# Patient Record
Sex: Female | Born: 1945 | Race: White | Hispanic: No | Marital: Married | Smoking: Former smoker
Health system: Southern US, Community
[De-identification: ages and names within clinical notes are randomized; demographics above are authoritative.]

## PROBLEM LIST (undated history)

## (undated) DIAGNOSIS — E785 Hyperlipidemia, unspecified: Secondary | ICD-10-CM

## (undated) DIAGNOSIS — E559 Vitamin D deficiency, unspecified: Secondary | ICD-10-CM

## (undated) DIAGNOSIS — K5790 Diverticulosis of intestine, part unspecified, without perforation or abscess without bleeding: Secondary | ICD-10-CM

## (undated) DIAGNOSIS — E039 Hypothyroidism, unspecified: Secondary | ICD-10-CM

## (undated) DIAGNOSIS — E119 Type 2 diabetes mellitus without complications: Secondary | ICD-10-CM

## (undated) DIAGNOSIS — IMO0002 Reserved for concepts with insufficient information to code with codable children: Secondary | ICD-10-CM

## (undated) DIAGNOSIS — N6002 Solitary cyst of left breast: Secondary | ICD-10-CM

## (undated) DIAGNOSIS — F419 Anxiety disorder, unspecified: Secondary | ICD-10-CM

## (undated) DIAGNOSIS — K449 Diaphragmatic hernia without obstruction or gangrene: Secondary | ICD-10-CM

## (undated) DIAGNOSIS — E1142 Type 2 diabetes mellitus with diabetic polyneuropathy: Secondary | ICD-10-CM

## (undated) DIAGNOSIS — S8002XA Contusion of left knee, initial encounter: Secondary | ICD-10-CM

## (undated) DIAGNOSIS — R55 Syncope and collapse: Secondary | ICD-10-CM

## (undated) DIAGNOSIS — I723 Aneurysm of iliac artery: Secondary | ICD-10-CM

## (undated) DIAGNOSIS — F32A Depression, unspecified: Secondary | ICD-10-CM

## (undated) DIAGNOSIS — J45909 Unspecified asthma, uncomplicated: Secondary | ICD-10-CM

## (undated) DIAGNOSIS — J449 Chronic obstructive pulmonary disease, unspecified: Secondary | ICD-10-CM

## (undated) DIAGNOSIS — I517 Cardiomegaly: Secondary | ICD-10-CM

## (undated) DIAGNOSIS — J309 Allergic rhinitis, unspecified: Secondary | ICD-10-CM

## (undated) DIAGNOSIS — F329 Major depressive disorder, single episode, unspecified: Secondary | ICD-10-CM

## (undated) DIAGNOSIS — I10 Essential (primary) hypertension: Secondary | ICD-10-CM

## (undated) DIAGNOSIS — I739 Peripheral vascular disease, unspecified: Secondary | ICD-10-CM

## (undated) DIAGNOSIS — N6001 Solitary cyst of right breast: Secondary | ICD-10-CM

## (undated) DIAGNOSIS — K219 Gastro-esophageal reflux disease without esophagitis: Secondary | ICD-10-CM

## (undated) HISTORY — PX: HERNIA REPAIR: SHX51

## (undated) HISTORY — DX: Anxiety disorder, unspecified: F41.9

## (undated) HISTORY — DX: Syncope and collapse: R55

## (undated) HISTORY — DX: Type 2 diabetes mellitus with diabetic polyneuropathy: E11.42

## (undated) HISTORY — DX: Unspecified asthma, uncomplicated: J45.909

## (undated) HISTORY — DX: Reserved for concepts with insufficient information to code with codable children: IMO0002

## (undated) HISTORY — PX: TOTAL ABDOMINAL HYSTERECTOMY: SHX209

## (undated) HISTORY — PX: BREAST BIOPSY: SHX20

## (undated) HISTORY — DX: Hypothyroidism, unspecified: E03.9

## (undated) HISTORY — DX: Diaphragmatic hernia without obstruction or gangrene: K44.9

## (undated) HISTORY — DX: Allergic rhinitis, unspecified: J30.9

## (undated) HISTORY — PX: REPLACEMENT TOTAL KNEE: SUR1224

## (undated) HISTORY — PX: CARPAL TUNNEL RELEASE: SHX101

## (undated) HISTORY — DX: Aneurysm of iliac artery: I72.3

## (undated) HISTORY — DX: Solitary cyst of right breast: N60.02

## (undated) HISTORY — PX: LUMBAR FUSION: SHX111

## (undated) HISTORY — DX: Chronic obstructive pulmonary disease, unspecified: J44.9

## (undated) HISTORY — DX: Contusion of left knee, initial encounter: S80.02XA

## (undated) HISTORY — PX: CHOLECYSTECTOMY: SHX55

## (undated) HISTORY — DX: Solitary cyst of right breast: N60.01

## (undated) HISTORY — DX: Peripheral vascular disease, unspecified: I73.9

## (undated) HISTORY — DX: Cardiomegaly: I51.7

## (undated) HISTORY — DX: Vitamin D deficiency, unspecified: E55.9

## (undated) HISTORY — DX: Essential (primary) hypertension: I10

## (undated) HISTORY — DX: Type 2 diabetes mellitus without complications: E11.9

## (undated) HISTORY — DX: Depression, unspecified: F32.A

## (undated) HISTORY — DX: Diverticulosis of intestine, part unspecified, without perforation or abscess without bleeding: K57.90

## (undated) HISTORY — DX: Gastro-esophageal reflux disease without esophagitis: K21.9

## (undated) HISTORY — DX: Hyperlipidemia, unspecified: E78.5

## (undated) HISTORY — DX: Major depressive disorder, single episode, unspecified: F32.9

---

## 2000-04-26 HISTORY — PX: CERVICAL FUSION: SHX112

## 2001-11-27 ENCOUNTER — Encounter: Payer: Self-pay | Admitting: Neurosurgery

## 2001-11-28 ENCOUNTER — Inpatient Hospital Stay (HOSPITAL_COMMUNITY): Admission: RE | Admit: 2001-11-28 | Discharge: 2001-12-02 | Payer: Self-pay | Admitting: Neurosurgery

## 2001-11-28 ENCOUNTER — Encounter: Payer: Self-pay | Admitting: Neurosurgery

## 2002-04-04 ENCOUNTER — Encounter: Payer: Self-pay | Admitting: Neurosurgery

## 2002-04-04 ENCOUNTER — Ambulatory Visit (HOSPITAL_COMMUNITY): Admission: RE | Admit: 2002-04-04 | Discharge: 2002-04-05 | Payer: Self-pay | Admitting: Neurosurgery

## 2002-05-08 ENCOUNTER — Encounter: Payer: Self-pay | Admitting: Neurosurgery

## 2002-05-08 ENCOUNTER — Inpatient Hospital Stay (HOSPITAL_COMMUNITY): Admission: RE | Admit: 2002-05-08 | Discharge: 2002-05-11 | Payer: Self-pay | Admitting: Neurosurgery

## 2002-11-23 ENCOUNTER — Ambulatory Visit (HOSPITAL_COMMUNITY): Admission: RE | Admit: 2002-11-23 | Discharge: 2002-11-23 | Payer: Self-pay | Admitting: Neurosurgery

## 2002-11-23 ENCOUNTER — Encounter: Payer: Self-pay | Admitting: Neurosurgery

## 2003-08-22 ENCOUNTER — Encounter: Admission: RE | Admit: 2003-08-22 | Discharge: 2003-08-22 | Payer: Self-pay | Admitting: Neurosurgery

## 2004-06-15 ENCOUNTER — Ambulatory Visit (HOSPITAL_COMMUNITY): Admission: RE | Admit: 2004-06-15 | Discharge: 2004-06-15 | Payer: Self-pay | Admitting: Neurosurgery

## 2006-04-05 ENCOUNTER — Other Ambulatory Visit: Payer: Self-pay

## 2006-04-05 ENCOUNTER — Inpatient Hospital Stay: Payer: Self-pay | Admitting: Endocrinology

## 2006-04-06 ENCOUNTER — Other Ambulatory Visit: Payer: Self-pay

## 2006-05-11 ENCOUNTER — Inpatient Hospital Stay: Payer: Self-pay | Admitting: Endocrinology

## 2007-01-24 IMAGING — CR DG CHEST 2V
1 series · 2 of 2 positions shown · non-contrast
Comparison: none

REASON FOR EXAM: Pneumonia
COMMENTS:

[Series 1: view not recorded · 0.17mm/px · 2 of 2 slices shown]
[im 1/2]
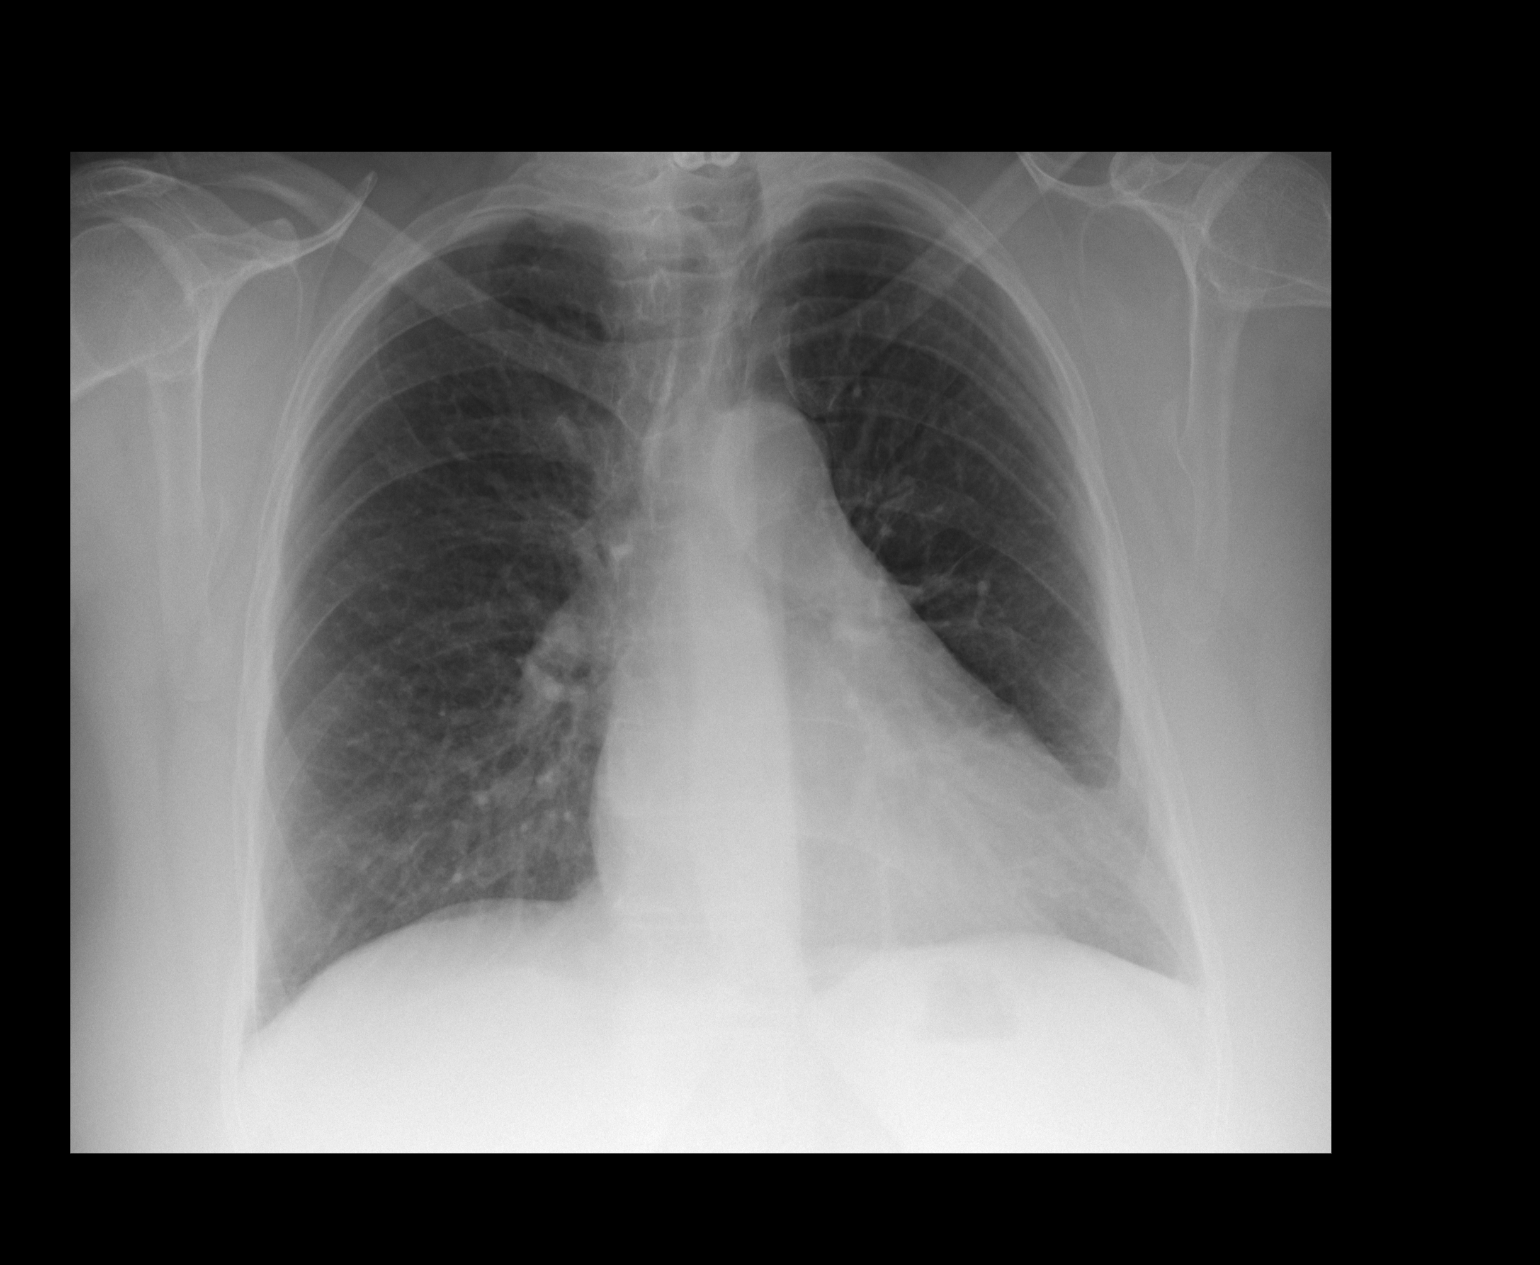
[im 2/2]
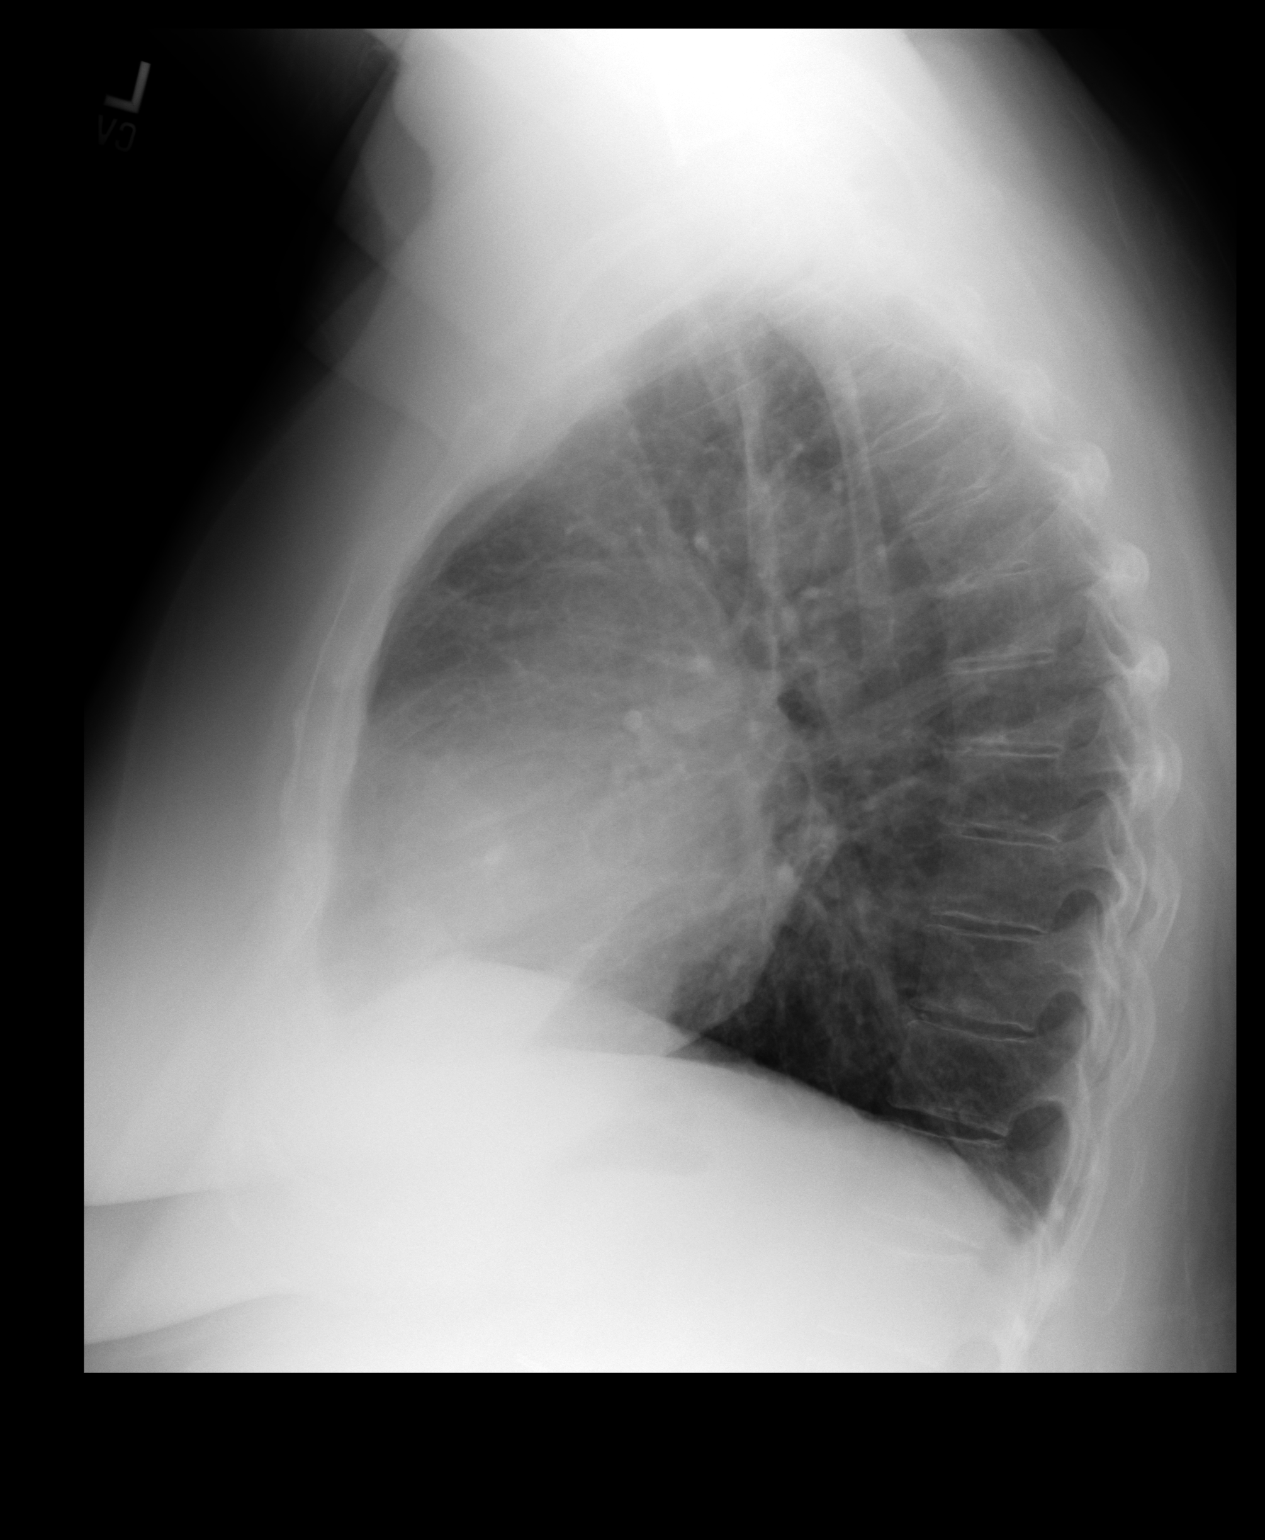

[2 of 2 positions shown; findings below may reference images not displayed]

PROCEDURE:     DXR - DXR CHEST PA (OR AP) AND LATERAL  - May 16, 2006 [DATE]

RESULT:     Comparison is made to the study of 05/12/06.

The lungs are mildly hyperinflated with hemidiaphragm flattening.  The
patchy density present on the LEFT in the region of the lingula has largely
cleared.  Only a small amount of density is seen along the LEFT heart border
now.  The cardiac silhouette is top normal in size.  The pulmonary
vascularity is not engorged and I see no pleural effusion.
IMPRESSION: There has been interval near total clearing of the infiltrate or atelectasis
in the lingula on the LEFT.

Mild hyperinflation persists consistent with an element of reactive airway
disease.

## 2008-11-19 ENCOUNTER — Encounter: Admission: RE | Admit: 2008-11-19 | Discharge: 2008-11-19 | Payer: Self-pay | Admitting: Neurosurgery

## 2009-08-06 ENCOUNTER — Ambulatory Visit: Payer: Self-pay | Admitting: Specialist

## 2010-09-11 NOTE — Op Note (Signed)
Molly Hoover, Molly Hoover                         ACCOUNT NO.:  [id number]   MEDICAL RECORD NO.:  [id number]                   PATIENT TYPE:  INP   LOCATION:  3029                                 FACILITY:  [facility]   PHYSICIAN:  Clydene Fake, M.D.               DATE OF BIRTH:  03/30/1946   DATE OF PROCEDURE:  05/08/2002  DATE OF DISCHARGE:                                 OPERATIVE REPORT   PREOPERATIVE DIAGNOSES:  1. 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  2. Spondylosis.  3. Lateral recessed stenosis.  4. L4-5 herniated nucleus pulposus.   POSTOPERATIVE DIAGNOSES:  1. 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  2. Spondylosis.  3. Lateral recessed stenosis.  4. L4-5 herniated nucleus pulposus.   OPERATION PERFORMED:  1. Failed decompressive laminectomies at L4-5 and L5-S1.  2. Posterior lumbar interbody infusion, L4-5 and L5-S1.  3. Interbody Brantigan cages at L4-5 and L5-S1.  4. Monarch segmented pedicle screw fixation, L4-5 and S1.  5. Posterolateral fusion, two levels from L4-S1,  6. Autografts, same incision, allograft, bone marrow aspirate.   SURGEON:  Clydene Fake, M.D.   ASSISTANT:  Stefani Dama, M.D.   ANESTHESIA:  General endotracheal tube anesthesia.   ESTIMATED BLOOD LOSS:  Fourteen-hundred cubic centimeters.   BLOOD REPLACED:  Eight-hundred cubic centimeters of cell saver blood given  back.   COMPLICATIONS:  No complications.   DRAINS:  No drains.   DISPOSITION:  The patient was transferred to the recovery room at the end of  the procedure in stable condition.   REASON FOR SURGERY:  The patient is a 65 year old woman who has been having  back and leg pain.  She also had some neck problems.  She underwent a CF  first; that was five months ago and she states she has done well there.  She  continues with back and leg problems.  Workup included an MRI and then  myelogram that showed unstable spondylolisthesis at L4-5 and L5-S1 with a  disk herniation at L4-5 and lateral recessed stenosis.  The patient was  brought in for decompression and fusion.   OPERATION IN DETAIL:  The patient was brought into the operating room where  general anesthesia was induced.  The patient was placed in the prone  position on the Wilson frame with all pressure points padded.  The patient  was then prepped and draped in a usual sterile fashion.  The site of  incision was injected with 10 cubic centimeters of 1% lidocaine with  epinephrine.   An incision was then made in the midline of the lower lumbar spine from L3  through S1.  This incision was taken down to the fascia and hemostasis was  obtained with Bovie cauterization.  The fascia was incised with the Bovie  and subperiosteal dissection was  done over the spinous process of the lamina  at the facets and transverse processes of L4-5 and the lateral sacrum  bilaterally. Fluoroscopic imaging was used to confirm positioning and then a  Gill decompressive laminectomy where we removed the pars and bilateral  facetectomies at both L4-5 and L5-S1 was then done to decompress the central  canal, and the nerve roots, both upper and lower roots, due to the  compression from the severe spondylosis from the spondylolisthesis.  We  ____________ decompression of the L4, L5 and S1 roots, and explored the disk  spaces with a guide and the decompression went out lateral to the start of  the facets on each side.   A large disk herniation was seen centrally and a little more to the left at  L4-5.  The disk spaces were incised and diskectomy was performed with  pituitary rongeurs and curets.  All the disk spaces were then incised both  at L4-5 and L5-S1 bilaterally; diskectomies performed.  We then distracted  the interspaces on one side up to 11 mm as we continued to do a diskectomy  on the opposite side preparing the interspaces  for interbody fusion.  We did  this at both levels. We removed the distraction on the opposite side and  then cleaned out the opposite side with pituitary rongeurs and curets.  We  then placed neural retractors around the upper root and lower roots, and  used a final broach, and tapped that into place removing the cartilaginous  endplate bilaterally at L4-5 and L5-S1 preparing the interspaces for our  cage placement.   All the bone that was removed during the laminectomy was cleaned and chopped  up into small pieces, and to this DBX putty was added, and this mixture was  packed into Brantigan cages; 11 high by 9 wide cages were used.  With  protection of the nerve roots the cages were tapped into place at L4-5;  first on one side and then the opposite side.  Before cages were placed,  this same bone mixture was placed into the interspace for interbody fusion  outside the cages.  Once the cages were in place we explored the other nerve  roots and they were also decompressed, and we placed Gelfoam and Thrombin at  the interspace and it went out into the L5-S1 level.  Note, we held  distraction on the opposite side when we were placing the first cage.  We  repeated this process at L5-S1, again, using an 11 mm high by 9 mm Brantigan  cages.  Both the cages entered the interspace after placing bone into the  interspace.  All the nerve roots were decompressed, ___________ Gelfoam and  Thrombin were placed in the epidural space.   Attention was then taken to the pedicles for a pedicle entry point using  fluoroscopic imaging as a guide.  We also used that during placement of the  cages, and it did show good position of the cages at all levels.  We  decorticated the entry point, placed a pedicle probe down the pedicle.  Using fluoroscopic imaging as a guide, we used a ball probe to probe the hole ____________ bone was surrounding on the hole, aspirated bone marrow  aspirate from each pedicle and  added this to conduit like a bone substitute  to be used later in the case for posterolateral fusion.  We then tapped the  pedicle, again checked the pedicle with a probe to make sure  that we had a  good bony surface circumferentially and then placed 6.25 mm Monarch screws.  Forty-five millimeters screws were placed into L4, 40 mm screws placed in L5  and 35 mm screws placed in S1.  This was done first on the left side on then  on the right side.  Once all the screws were in we placed a rod into the  screw heads and then placed __________ bilaterally, and then placed the  locking nuts over those.  We tightened up ________nut at L4 with the final  tightener; and, then with some compression over the interspace tightened the  nut at L5 with some compression over L5-S1.  We tightened the nut at S1.  We  had repeated this process on the other side.   When our fusions were done AP and  lateral fluoroscopic imagings were  obtained showing good position and good alignment of the spine, with good  position of the cages, and the pedicle screws and rods.   The wound was irrigated with antibiotic solution; and, when we decorticated  for placement of the pedicle screws we also decorticated the lateral facets  and transverse processes of L4-5, and the lateral sacrum.  At this point,  _____________ autograft bone, DBX putty and conduit associated with the bone  marrow aspirate were all mixed together, and this mixture was placed in the  posterolateral gutters for posterolateral fusion from L4 to S1 bilaterally.   We then explored the nerve roots, removing the Gelfoam.  The nerve roots  were all decompressed.  We placed some Gelfoam back over the nerve roots  __________ posterior bone fragments would bound and compress the roots.  We  removed the retractors and the paraspinous muscles and fascia were then  closed with 0-Vicryl interrupted suture.  The subcutaneous tissue was closed  0, 2-0 and 3-0  Vicryl interrupted sutures, and the skin closed the Benzoin  and Steri-strips.  Dressing was placed.   The patient was placed back in the supine position, awakened from anesthesia  and taken to the recovery room in stable condition.                                                Clydene Fake, M.D.    JRH/MEDQ  D:  05/08/2002  T:  05/08/2002  Job:  [id number]

## 2010-09-11 NOTE — Op Note (Signed)
NAMEGEETIKA, LABORDE               ACCOUNT NO.:  [id number]   MEDICAL RECORD NO.:  [id number]          PATIENT TYPE:  OIB   LOCATION:  2899                         FACILITY:  [facility]   PHYSICIAN:  Clydene Fake, M.D.  DATE OF BIRTH:  January 30, 1946   DATE OF PROCEDURE:  06/15/2004  DATE OF DISCHARGE:                                 OPERATIVE REPORT   PREOPERATIVE DIAGNOSIS:  Right carpal tunnel syndrome.   POSTOPERATIVE DIAGNOSIS:  Right carpal tunnel syndrome.   OPERATION PERFORMED:  Right carpal tunnel release.   SURGEON:  Clydene Fake, M.D.   INDICATIONS FOR PROCEDURE:  The patient is a 65 year old woman who has had  bilateral wrist and hand pain with numbness worse on the right with EMG and  nerve conduction velocity showing bilateral carpal tunnel syndrome of  moderate degree, worse on the right.  The patient was brought in for a right-  sided carpal tunnel release.   DESCRIPTION OF PROCEDURE:  The patient was brought to the operating room and  sedated by anesthesia.  The patient was prepped and draped in a sterile  fashion.  The site of incision across the wrist and the right arm was  injected with 10 mL of 1% lidocaine with epinephrine.  An S-shaped incision  was then made across the wrist to the distal palm.  The incision was taken  down to the flexor retinaculum.  The flexor retinaculum was incised with a  15 blade down in order to visualize the nerve.  A #4 Penfield was placed  under the ligament over the nerve and the ligament was opened with a 15  blade then along its course both proximally and distally.  Neurolysis of the  nerve was done, freeing up the nerve more, explored distally. As mentioned,  the  nerve was completely decompressed the course of the flexor retinaculum  and then using Metzenbaum scissors, we made sure it was decompressed more  proximally. When we were finished, we had good decompression of the nerve.  Hemostasis was obtained with bipolar cautery.   Wound was irrigated with  antibiotic solution and the incision closed with 4-0 nylon mattress and  simple interrupted sutures.  Dressing was placed, then wrapped with Kerlix,  then lightly with Ace wrap.  Then the patient was transferred to recovery  room in stable condition.      JRH/MEDQ  D:  06/15/2004  T:  06/15/2004  Job:  [id number]

## 2010-09-11 NOTE — Discharge Summary (Signed)
   [name], Molly Hoover                         ACCOUNT NO.:  [id number]   MEDICAL RECORD NO.:  [id number]                   PATIENT TYPE:  INP   LOCATION:  3012                                 FACILITY:  [facility]   PHYSICIAN:  Cristi Loron, M.D.            DATE OF BIRTH:  1945/12/23   DATE OF ADMISSION:  11/28/2001  DATE OF DISCHARGE:  12/02/2001                                 DISCHARGE SUMMARY   For full details of this admission, please refer to the typed history and  physical.   BRIEF HISTORY:  The patient is a 65 year old female who suffered from neck  and arm pain.  She failed medical management and was worked up and decided  to proceed with C4-5 and C5-6 anterior cervical diskectomies, fusion and  plating after herniated disk and spondylosis were diagnosed at these levels.   HOSPITAL COURSE:  Dr. Clydene Fake admitted the patient to [facility] on November 28, 2001.  On the day of admission, he performed C4-5 and  C5-6 anterior cervical diskectomies, interbody iliac crest allograft  arthrodesis with anterior cervical plating.  The surgery went well without  complication (for further details of this operation, please refer to typed  operative note).   Postoperative course:  The patient's postoperative course was remarkable as  follows:  She had persistent dysphagia which resolved after a couple of days  of steroids.  By December 02, 2001, the patient was afebrile, vital signs were  stable and she was eating well and ambulating well, wound was healing well  without signs of infection and she was felt to be stable for discharge home  and she requested discharge home.   DISCHARGE INSTRUCTIONS:  The patient was given written discharge  instructions and instructed to follow up with Dr. Phoebe Perch.   She was also instructed to resume her outpatient medical regimen.   DISCHARGE MEDICATIONS:  1. Vicodin ES, #51.  2. Flexeril 10 mg, #40.   PROCEDURES PERFORMED:  C4-5 and  C5-6 anterior cervical diskectomies,  interbody iliac crest allograft arthrodesis and anterior cervical plating.   FINAL DIAGNOSES:  1. C4-5 and C5-6 spondylosis and herniated nucleus pulposus and cervical     myelopathy.  2. Type 2 diabetes mellitus.  3. Hypertension.                                               Cristi Loron, M.D.    JDJ/MEDQ  D:  01/08/2002  T:  01/09/2002  Job:  [id number]

## 2010-09-11 NOTE — Discharge Summary (Signed)
Molly Hoover, Molly Hoover                         ACCOUNT NO.:  [id number]   MEDICAL RECORD NO.:  [id number]                   PATIENT TYPE:  INP   LOCATION:  3029                                 FACILITY:  [facility]   PHYSICIAN:  Clydene Fake, M.D.               DATE OF BIRTH:  Nov 12, 1945   DATE OF ADMISSION:  05/08/2002  DATE OF DISCHARGE:  05/11/2002                                 DISCHARGE SUMMARY   ADMISSION DIAGNOSES:  1. Unstable spondylolisthesis at L4-L5 and L5-S1 with degenerative disc     disease.  2. Spondylosis.  3. Recess stenosis.   DISCHARGE DIAGNOSES:  1. Unstable spondylolisthesis at L4-L5 and L5-S1 with degenerative disc     disease.  2. Spondylosis.  3. Recess stenosis.   PROCEDURES:  Gill's decompression at L4-L5 and L5-S1 with posterior laminar  interbody fusion at L4-L5 and L5-S1 with interbody Browning cages __________  screws and posterolateral fusion.   REASON FOR ADMISSION:  The patient is a 65 year old woman who has had back  and leg pain.  She was found to have some unstable spondylolisthesis at L4-  L5 and L5-S1 with herniated disc at L4-L5 and severe degenerative changes at  both levels.  The patient was brought in for decompression and fusion.   HOSPITAL COURSE:  The patient was admitted on the day of surgery and  underwent the procedure above without complications.  Postoperatively, the  patient was transferred to the floor and then to the recovery room and was  moving both legs well and having decreased leg pain, although she has been  getting some spasms.   She started getting up and moving around the first hospital day.  She did  have some pain and, by the end of the day, she was having a bit of pain and  had a bad night that next night, but on May 10, 2002, she started doing  much better, ambulating a lot better, doing well.  She had a bowel movement  and was eating better and overall feeling a lot better.  The incisions  stayed  clean, dry and intact.  The patient was discharged home on May 11, 2002, in stable condition.   DISCHARGE MEDICATIONS:  Same as prehospitalization plus:  1. Percocet 1-2 p.o. q.4-6 h. p.r.n.  2. Flexeril p.r.n. spasms.  3. Valium p.r.n. severe spasms or pain.   PLAN:  1. Diet as tolerated.  2. Keep incision dry.  3.     Up with brace.  4. No strenuous activity.  5. Follow up with me in three weeks.                                               Clydene Fake, M.D.    JRH/MEDQ  D:  05/11/2002  T:  05/11/2002  Job:  [id number]

## 2010-09-11 NOTE — H&P (Signed)
   Molly Hoover, Molly Hoover                         ACCOUNT NO.:  [id number]   MEDICAL RECORD NO.:  [id number]                   PATIENT TYPE:  INP   LOCATION:  3029                                 FACILITY:  [facility]   PHYSICIAN:  Clydene Fake, M.D.               DATE OF BIRTH:  1945-11-06   DATE OF ADMISSION:  05/08/2002  DATE OF DISCHARGE:                                HISTORY & PHYSICAL   CHIEF COMPLAINT:  Back and bilateral upper leg pain.   HISTORY:  This patient is a 65 year old woman who has had two level ACF done  five months ago and also had back and leg pain at that point in time.  Work  up with MRI back over the summer showed severe spondylosis of the lumbar  spine at multiple levels with some spondylolisthesis at L4-5 and 5-1, she  ended up also getting a myelogram of her spine with flexion and extension  showing a little bit of motion at 4-5 and 5-1, with some spondylolisthesis  at L4-5, lateral recess stenosis and large disk herniation at 4-5 central  and to the left.  Patient brought in for decompression and fusion.   PAST MEDICAL HISTORY:  Significant for diabetes and asthma.   PAST SURGICAL HISTORY:  Includes hysterectomy in 1970, gallbladder in 1997,  left breast surgery in 1992 and 1993 and the ACF in 2003.   MEDICATIONS:  Include gemfibrozil, Singulair, Premarin, Nexium, Glycet,  Advair, Allegra, Norvasc.   SOCIAL HISTORY:  She does not smoke, quit in 1990.  Drinks alcohol  occasional.  Is a housewife, married.   REVIEW OF SYSTEMS:  Otherwise negative.   FAMILY HISTORY:  Noncontributory.   PHYSICAL EXAMINATION:  HEENT:  Unremarkable.  NECK:  Supple.  LUNGS:  Clear.  HEART:  Regular rhythm.  ABDOMEN:  Soft and nontender.  EXTREMITIES:  Intact, no edema.  BACK:  Bilateral straight leg raise on the right causing upper leg pain, not  really clearly radicular pain.  Negative on the left.  Motor strength  intact.  Sensation slightly decreased to light touch  in the right L5  distribution.  Reflexes are diminished at the ankles bilaterally.   ASSESSMENT/PLAN:  Patient with sternal spondylolisthesis and stenosis of the  lumbar spine, admitted for decompression and fusion.                                                Clydene Fake, M.D.   JRH/MEDQ  D:  05/08/2002  T:  05/08/2002  Job:  [id number]

## 2010-09-11 NOTE — Op Note (Signed)
NAMENAVNEET, SCHMUCK                         ACCOUNT NO.:  [id number]   MEDICAL RECORD NO.:  [id number]                   PATIENT TYPE:  INP   LOCATION:  3172                                 FACILITY:  [facility]   PHYSICIAN:  Clydene Fake, M.D.               DATE OF BIRTH:  [birth year]   DATE OF PROCEDURE:  11/28/2001  DATE OF DISCHARGE:                                 OPERATIVE REPORT   PREOPERATIVE DIAGNOSES:  Herniated nucleus pulposus and spondylosis causing  internal stenosis and myelopathy at C4-5 and 5-6.   POSTOPERATIVE DIAGNOSES:  Herniated nucleus pulposus and spondylosis causing  internal stenosis and myelopathy at C4-5 and 5-6.   PROCEDURE:  Anterior cervical diskectomy and fusion C4-5 and 5-6 with  allograft and anterior cervical plate.   SURGEON:  Clydene Fake, M.D.   ASSISTANT:  Cristi Loron, M.D.   ANESTHESIA:  General endotracheal tube.   ESTIMATED BLOOD LOSS:  Minimal.   BLOOD REPLACED:  None.   DRAINS:  None.   COMPLICATIONS:  None.   REASON FOR PROCEDURE:  The patient is a 65 year old woman whose been having  neck and bilateral arm pain. This has been progressive over the last two  months. An MRI was done of the neck showing cord compression and spondylosis  at 4-5 and 5-6, worse at 4-5 requiring a two level anterior cervical  diskectomy. She is slightly myelopathic at the time of exam.   DESCRIPTION OF PROCEDURE:  The patient was brought to the operating room,  general anesthesia was induced. The patient was placed in Holter traction  with 10 pounds, prepped and draped in the usual sterile fashion. The  incision was injected with 10 cc of 1% lidocaine with epinephrine. An  incision was then made from the midline to the anterior border of the  sternocleidomastoid on the left side of the neck. The incision was made in  the midline of the platysma and hemostasis obtained with Bovie  cauterization. The platysma was incised with the Bovie and  blunt dissection  taken through the anterior cervical fascia to the anterior cervical spine. A  needle was placed in the disk space, x-ray obtained showing the C4-5  interspace. This disk space was incised with a 15 blade and partial  diskectomy performed, base of the needle was removed. The longus colli  muscle was then reflected laterally on each side using the Bovie when the  self retaining retractor was placed and extended over the 4-5 disk space.  Distraction pins were placed in the C4 and C5 and disk space distracted.   Diskectomy was then performed using curettes and pituitary rongeurs. The  posterior osteophytes, ligament and annulus were then removed with one 2 mm  Kerrison punches. We worked out laterally and bilateral foraminotomies were  performed and we did, after we were finished, decompress the spinal cord and  bilateral roots. The high speed  drill was used to remove the endplates at 4  and 5. The depth gauge was used to measure depth of vertebral body. A 6 mm  Tutogen allograft bone was cut a few millimeters shorter in depth and then  this was tapped into place. The distractor was removed and distraction pin  removed from C4 and placed into C6. The 5-6 interspace was incised with a 15  blade, partial diskectomy performed and 5-6 disk space was then distracted  with the distractor. Still using the microscope, diskectomy was then  performed with pituitary rongeurs and curettes and 1 and 2 mm Kerrison  punches were used to remove the posterior __________  ligaments and annulus.  To decompress the cord the bilateral foramen were opened. At the end,  __________ volume was measured and a 7 mm Tutogen allograft bone was then  cut to be a couple millimeters shorter in depth and tapped into placed.  Behind both bone plugs, we could put a nerve hook making sure there was room  between the dura and back down through the bone.   The wound was irrigated with antibiotic solution. The  distraction pins were  all removed. There was good hemostasis with Gelfoam and prothrombin and the  rest of the Gelfoam was irrigated out. The microscope was removed from the  field at this point and the traction weight was removed. A tether __________  plate was then closed C4 to C6 with two screws placed in the C4, two screws  placed in the C6, and one screw near C5. This was all C4, C6 and C5. The  screws were tightened down, x-ray was obtained showing good position of  plate and screws and interbody grafts and good alignment of the cervical  spine. The wound was irrigated with thrombin solution, hemostasis obtained  with Gelfoam and bipolar cauterization. All the Gelfoam was irrigated out  and when we had absolute hemostasis, the platysma was closed with 3-0 Vicryl  interrupted suture. The subcutaneous tissue was closed with 3-0 Vicryl  interrupted suture and the skin was closed with Benzoin and Steri-Strips. A  dressing was placed. The patient was placed into a hard cervical collar,  awakened from anesthesia and transferred to the recovery room in stable  condition.                                               Clydene Fake, M.D.    JRH/MEDQ  D:  11/28/2001  T:  12/02/2001  Job:  [id number]

## 2011-04-28 LAB — HM COLONOSCOPY: HM COLON: NORMAL

## 2011-07-02 ENCOUNTER — Ambulatory Visit: Payer: Self-pay | Admitting: Endocrinology

## 2011-08-23 ENCOUNTER — Ambulatory Visit: Payer: Self-pay | Admitting: Gastroenterology

## 2013-08-24 LAB — HM MAMMOGRAPHY: HM MAMMO: NORMAL

## 2013-10-09 LAB — HM DIABETES EYE EXAM

## 2013-10-10 ENCOUNTER — Ambulatory Visit: Payer: Self-pay | Admitting: Internal Medicine

## 2013-10-17 ENCOUNTER — Encounter: Payer: Self-pay | Admitting: Cardiovascular Disease

## 2013-10-17 ENCOUNTER — Ambulatory Visit (INDEPENDENT_AMBULATORY_CARE_PROVIDER_SITE_OTHER): Payer: Medicare Other | Admitting: Cardiovascular Disease

## 2013-10-17 ENCOUNTER — Other Ambulatory Visit (HOSPITAL_COMMUNITY): Payer: Self-pay | Admitting: *Deleted

## 2013-10-17 ENCOUNTER — Encounter (INDEPENDENT_AMBULATORY_CARE_PROVIDER_SITE_OTHER): Payer: Self-pay

## 2013-10-17 VITALS — BP 170/80 | HR 78 | Ht 62.0 in | Wt 269.0 lb

## 2013-10-17 DIAGNOSIS — R059 Cough, unspecified: Secondary | ICD-10-CM

## 2013-10-17 DIAGNOSIS — I10 Essential (primary) hypertension: Secondary | ICD-10-CM | POA: Insufficient documentation

## 2013-10-17 DIAGNOSIS — R0602 Shortness of breath: Secondary | ICD-10-CM | POA: Insufficient documentation

## 2013-10-17 DIAGNOSIS — E118 Type 2 diabetes mellitus with unspecified complications: Secondary | ICD-10-CM | POA: Insufficient documentation

## 2013-10-17 DIAGNOSIS — R05 Cough: Secondary | ICD-10-CM | POA: Insufficient documentation

## 2013-10-17 DIAGNOSIS — R053 Chronic cough: Secondary | ICD-10-CM

## 2013-10-17 DIAGNOSIS — Z789 Other specified health status: Secondary | ICD-10-CM

## 2013-10-17 DIAGNOSIS — R262 Difficulty in walking, not elsewhere classified: Secondary | ICD-10-CM | POA: Insufficient documentation

## 2013-10-17 DIAGNOSIS — R079 Chest pain, unspecified: Secondary | ICD-10-CM

## 2013-10-17 DIAGNOSIS — R0600 Dyspnea, unspecified: Secondary | ICD-10-CM

## 2013-10-17 DIAGNOSIS — E119 Type 2 diabetes mellitus without complications: Secondary | ICD-10-CM

## 2013-10-17 NOTE — Assessment & Plan Note (Signed)
She reports having a cough prior to the start of her ACE inhibitor. We did suggest she try to hold her ACE inhibitor for one to 2 weeks to see if her cough does improve to any degree

## 2013-10-17 NOTE — Assessment & Plan Note (Signed)
As she is unable to ambulate, she would not be able to do a routine treadmill study. Myoview has been ordered

## 2013-10-17 NOTE — Progress Notes (Signed)
Patient ID: Molly Hoover, female    DOB: 14-Sep-1945, 68 y.o.   MRN: [id number]  HPI Comments: Molly Hoover is a pleasant 68 year old woman with history of asthmatic bronchitis, type 2 diabetes, previous smoking history for at least 15 years who stopped in 1989, morbid obesity who presents for evaluation of shortness of breath and chest pain. She was told by her prior primary care physician that she had an enlarged heart on chest x-ray. She has DJD and chronic back pain, hyperlipidemia  Reports that in May 2013, she was treated for asthmatic bronchitis. Required antibiotics and prednisone. In the workup, she had a chest x-ray and was told by primary care that she had an enlarged heart.   She has chest pain over the past year. Sometimes with rest, sometimes with exertion. Sometimes pain is very intense and goes through to her back. Other times described as a stinging. She has chronic shortness of breath with exertion. This has been getting worse. Prior stress tests in 2007 was suboptimal as she did not achieve a target heart rate She denies any orthopnea or PND. She does have lower extremity swelling which has been a chronic issue, worse in the late afternoon  Blood pressure elevated today. She reports blood pressure at other doctor visits has been relatively well-controlled. Blood pressure may 22nd 2015 was 128/68 Problem work creatinine 0.75, normal LFTs, CRP 7.3, no recent cholesterol available, glucose 111  Prior echocardiogram March 2013 was essentially normal. Normal ejection fraction with normal LV size Admission to the hospital December 2007 for atypical chest pain.  EKG shows normal sinus rhythm with rate 73 beats per minute, low voltage, no significant ST or T wave changes  Outpatient Encounter Prescriptions as of 10/17/2013  Medication Sig  . alendronate (FOSAMAX) 70 MG tablet Take 70 mg by mouth once a week. Take with a full glass of water on an empty stomach.  . Cranberry 500 MG  CAPS Take by mouth daily.  . furosemide (LASIX) 40 MG tablet Take 40 mg by mouth 2 (two) times daily.  Marland Kitchen gemfibrozil (LOPID) 600 MG tablet Take 600 mg by mouth 2 (two) times daily before a meal.  . glipiZIDE-metformin (METAGLIP) 5-500 MG per tablet Take 1 tablet by mouth 2 (two) times daily before a meal.  . lisinopril (PRINIVIL,ZESTRIL) 5 MG tablet Take 5 mg by mouth daily.  . mometasone-formoterol (DULERA) 100-5 MCG/ACT AERO Inhale 2 puffs into the lungs 2 (two) times daily.  Marland Kitchen omega-3 acid ethyl esters (LOVAZA) 1 G capsule Take by mouth 2 (two) times daily.  . pioglitazone (ACTOS) 45 MG tablet Take 45 mg by mouth daily.  Marland Kitchen tiotropium (SPIRIVA) 18 MCG inhalation capsule Place 18 mcg into inhaler and inhale daily.  . Vitamin D, Ergocalciferol, (DRISDOL) 50000 UNITS CAPS capsule Take 50,000 Units by mouth every 7 (seven) days.    Review of Systems  Constitutional: Negative.   HENT: Negative.   Eyes: Negative.   Respiratory: Positive for chest tightness and shortness of breath.   Cardiovascular: Positive for chest pain.  Gastrointestinal: Negative.   Endocrine: Negative.   Musculoskeletal: Positive for arthralgias, back pain and gait problem.  Skin: Negative.   Allergic/Immunologic: Negative.   Neurological: Negative.   Hematological: Negative.   Psychiatric/Behavioral: Negative.   All other systems reviewed and are negative.   BP 170/80  Pulse 78  Ht 5\' 2"  (1.575 m)  Wt 269 lb (122.018 kg)  BMI 49.19 kg/m2 Repeat blood pressure 160/80  Physical Exam  Nursing note and vitals reviewed. Constitutional: She is oriented to person, place, and time. She appears well-developed and well-nourished.  HENT:  Head: Normocephalic.  Nose: Nose normal.  Mouth/Throat: Oropharynx is clear and moist.  Eyes: Conjunctivae are normal. Pupils are equal, round, and reactive to light.  Neck: Normal range of motion. Neck supple. No JVD present.  Cardiovascular: Normal rate, regular rhythm, S1  normal, S2 normal, normal heart sounds and intact distal pulses.  Exam reveals no gallop and no friction rub.   No murmur heard. Pulmonary/Chest: Effort normal and breath sounds normal. No respiratory distress. She has no wheezes. She has no rales. She exhibits no tenderness.  Abdominal: Soft. Bowel sounds are normal. She exhibits no distension. There is no tenderness.  Musculoskeletal: Normal range of motion. She exhibits no edema and no tenderness.  Lymphadenopathy:    She has no cervical adenopathy.  Neurological: She is alert and oriented to person, place, and time. Coordination normal.  Skin: Skin is warm and dry. No rash noted. No erythema.  Psychiatric: She has a normal mood and affect. Her behavior is normal. Judgment and thought content normal.    Assessment and Plan

## 2013-10-17 NOTE — Assessment & Plan Note (Signed)
She reports long and worsening history of chest pain worse with exertion, also sometimes at rest. Several risk factors including prior smoking history, diabetes. She is unable to treadmill secondary to back/DJD, shortness of breath with exertion. We will order a Myoview to rule out ischemia

## 2013-10-17 NOTE — Assessment & Plan Note (Addendum)
She'll be limited in her ability to exercise. We have recommended dieting, pool therapy

## 2013-10-17 NOTE — Patient Instructions (Addendum)
We will schedule you for an echocardiogram for shortness of breath and enlarged heart   We will also schedule a lexiscan myoview for chest pain and shortness of breath  Do a trial hold of the lisinopril for 1 to 2 weeks to see if your cough gets any better  Monitor your blood pressure at home  Please call us if you have new issues that need to be addressed before your next appt.    [place]  Your caregiver has ordered a Stress Test with nuclear imaging. The purpose of this test is to evaluate the blood supply to your heart muscle. This procedure is referred to as a "Non-Invasive Stress Test." This is because other than having an IV started in your vein, nothing is inserted or "invades" your body. Cardiac stress tests are done to find areas of poor blood flow to the heart by determining the extent of coronary artery disease (CAD). Some patients exercise on a treadmill, which naturally increases the blood flow to your heart, while others who are  unable to walk on a treadmill due to physical limitations have a pharmacologic/chemical stress agent called Lexiscan . This medicine will mimic walking on a treadmill by temporarily increasing your coronary blood flow.   Please note: these test may take anywhere between 2-4 hours to complete  PLEASE REPORT TO [facility] AT THE FIRST DESK WILL DIRECT YOU WHERE TO GO  Date of Procedure:____Thursday, June 25___________  Arrival Time for Procedure:______9:45am_____________  PLEASE NOTIFY THE OFFICE AT LEAST 24 HOURS IN ADVANCE IF YOU ARE UNABLE TO KEEP YOUR APPOINTMENT.  [phone] AND  PLEASE NOTIFY NUCLEAR MEDICINE AT [facility] AT LEAST 24 HOURS IN ADVANCE IF YOU ARE UNABLE TO KEEP YOUR APPOINTMENT. [phone]  How to prepare for your Myoview test:  1. Do not eat or drink after midnight 2. No caffeine for 24 hours prior to test 3. No smoking 24 hours prior to test. 4. Your medication may be taken with water.   If your doctor stopped a medication because of this test, do not take that medication. 5. Ladies, please do not wear dresses.  Skirts or pants are appropriate. Please wear a short sleeve shirt. 6. No perfume, cologne or lotion. 7. Wear comfortable walking shoes. No heels!

## 2013-10-17 NOTE — Assessment & Plan Note (Signed)
Blood pressure is elevated today. Prior blood pressure with primary care was within a reasonable range. We have suggested she try to purchase a blood pressure cuff and check her pressures at home. No medication changes made today although we did suggest holding her ACE inhibitor for one to 2 weeks to see if her cough improves.

## 2013-10-17 NOTE — Assessment & Plan Note (Signed)
We have encouraged continued exercise, careful diet management in an effort to lose weight. 

## 2013-10-17 NOTE — Assessment & Plan Note (Signed)
Echocardiogram has been ordered for enlarged heart per the chest x-ray done through primary care. Myoview ordered to rule out ischemia. If these are normal, I suspect her shortness of breath is likely from deconditioning and obesity, plus a component of asthma

## 2013-10-18 ENCOUNTER — Ambulatory Visit: Payer: Self-pay | Admitting: Cardiovascular Disease

## 2013-10-18 DIAGNOSIS — R079 Chest pain, unspecified: Secondary | ICD-10-CM

## 2013-10-18 DIAGNOSIS — R0989 Other specified symptoms and signs involving the circulatory and respiratory systems: Secondary | ICD-10-CM

## 2013-10-18 DIAGNOSIS — R0609 Other forms of dyspnea: Secondary | ICD-10-CM

## 2013-10-19 ENCOUNTER — Other Ambulatory Visit: Payer: Self-pay

## 2013-10-19 DIAGNOSIS — R079 Chest pain, unspecified: Secondary | ICD-10-CM

## 2013-10-19 DIAGNOSIS — R0602 Shortness of breath: Secondary | ICD-10-CM

## 2013-10-24 ENCOUNTER — Telehealth: Payer: Self-pay | Admitting: Cardiovascular Disease

## 2013-10-24 NOTE — Telephone Encounter (Signed)
Pt requesting results from [facility] 10/19/13.

## 2013-10-24 NOTE — Telephone Encounter (Signed)
Pt wanted to know if she could get her results from test did recently

## 2013-10-24 NOTE — Telephone Encounter (Signed)
Reviewed preliminary results w/ pt.   

## 2013-10-30 ENCOUNTER — Other Ambulatory Visit: Payer: Self-pay

## 2013-10-30 ENCOUNTER — Other Ambulatory Visit (INDEPENDENT_AMBULATORY_CARE_PROVIDER_SITE_OTHER): Payer: Medicare Other

## 2013-10-30 DIAGNOSIS — R0600 Dyspnea, unspecified: Secondary | ICD-10-CM

## 2013-10-30 DIAGNOSIS — R0602 Shortness of breath: Secondary | ICD-10-CM

## 2013-10-30 DIAGNOSIS — R079 Chest pain, unspecified: Secondary | ICD-10-CM

## 2013-11-02 ENCOUNTER — Telehealth: Payer: Self-pay

## 2013-11-02 MED ORDER — LOSARTAN POTASSIUM 25 MG PO TABS: 25.0000 mg | ORAL_TABLET | Freq: Every day | ORAL | Status: DC

## 2013-11-02 NOTE — Telephone Encounter (Signed)
Pt states she has stopped her Lisinopril her BP has been running 145/72-73 range. Please call and advise what she should do.

## 2013-11-02 NOTE — Telephone Encounter (Signed)
Would not restart the lisinopril Instead would start losartan 25 mg daily If blood pressure does not come down, losartan dose could be increased up to 50 or 100 mg daily Losartan should not cause a cough

## 2013-11-02 NOTE — Telephone Encounter (Signed)
Left detailed message on pt's voicemail of Dr. Donivan Scull recommendation. Asked her to call w/ any questions or concerns.

## 2013-11-02 NOTE — Telephone Encounter (Signed)
Spoke w/ pt.  She reports that she was advised to hold her lisinopril at last ov due to cough.  Reports her cough is about "3/4 resolved". BP averaging 143/72.  She would like to know if she should resume her lisinopril or if she should be on a different med.  Please advise.  Thank you.

## 2013-11-09 ENCOUNTER — Telehealth: Payer: Self-pay | Admitting: *Deleted

## 2013-11-09 NOTE — Telephone Encounter (Signed)
Please call patient with her echo results.

## 2013-11-09 NOTE — Telephone Encounter (Signed)
Reviewed results w/ pt.  

## 2014-03-26 HISTORY — PX: ESOPHAGOGASTRODUODENOSCOPY: SHX1529

## 2014-04-16 ENCOUNTER — Ambulatory Visit: Payer: Self-pay | Admitting: Gastroenterology

## 2014-04-23 ENCOUNTER — Ambulatory Visit: Payer: Self-pay | Admitting: Gastroenterology

## 2014-06-19 ENCOUNTER — Emergency Department: Payer: Self-pay | Admitting: Emergency Medicine

## 2014-08-27 LAB — LIPID PANEL
Cholesterol: 219 mg/dL — AB (ref 0–200)
HDL: 36 mg/dL (ref 35–70)
LDL Cholesterol: 152 mg/dL
Triglycerides: 154 mg/dL (ref 40–160)

## 2014-08-27 LAB — HEPATIC FUNCTION PANEL
ALT: 35 U/L (ref 7–35)
AST: 35 U/L (ref 13–35)

## 2014-08-27 LAB — BASIC METABOLIC PANEL
BUN: 14 mg/dL (ref 4–21)
CREATININE: 0.6 mg/dL (ref <=1.1)

## 2014-08-27 LAB — CBC AND DIFFERENTIAL: Hemoglobin: 13.3 g/dL (ref 12.0–16.0)

## 2014-08-27 LAB — HEMOGLOBIN A1C: Hgb A1c MFr Bld: 11.5 % — AB (ref 4.0–6.0)

## 2014-10-14 ENCOUNTER — Other Ambulatory Visit: Payer: Self-pay | Admitting: Internal Medicine

## 2014-10-16 ENCOUNTER — Other Ambulatory Visit: Payer: Self-pay | Admitting: Internal Medicine

## 2014-11-19 ENCOUNTER — Encounter: Payer: Self-pay | Admitting: Internal Medicine

## 2014-11-19 DIAGNOSIS — F17201 Nicotine dependence, unspecified, in remission: Secondary | ICD-10-CM | POA: Insufficient documentation

## 2014-11-19 DIAGNOSIS — E785 Hyperlipidemia, unspecified: Secondary | ICD-10-CM

## 2014-11-19 DIAGNOSIS — J449 Chronic obstructive pulmonary disease, unspecified: Secondary | ICD-10-CM | POA: Insufficient documentation

## 2014-11-19 DIAGNOSIS — Z8719 Personal history of other diseases of the digestive system: Secondary | ICD-10-CM | POA: Insufficient documentation

## 2014-11-19 DIAGNOSIS — I872 Venous insufficiency (chronic) (peripheral): Secondary | ICD-10-CM | POA: Insufficient documentation

## 2014-11-19 DIAGNOSIS — M62838 Other muscle spasm: Secondary | ICD-10-CM | POA: Insufficient documentation

## 2014-11-19 DIAGNOSIS — I10 Essential (primary) hypertension: Secondary | ICD-10-CM | POA: Insufficient documentation

## 2014-11-19 DIAGNOSIS — M818 Other osteoporosis without current pathological fracture: Secondary | ICD-10-CM | POA: Insufficient documentation

## 2014-11-19 DIAGNOSIS — E1165 Type 2 diabetes mellitus with hyperglycemia: Secondary | ICD-10-CM | POA: Insufficient documentation

## 2014-11-19 DIAGNOSIS — R600 Localized edema: Secondary | ICD-10-CM | POA: Insufficient documentation

## 2014-11-19 DIAGNOSIS — K209 Esophagitis, unspecified without bleeding: Secondary | ICD-10-CM | POA: Insufficient documentation

## 2014-11-19 DIAGNOSIS — IMO0002 Reserved for concepts with insufficient information to code with codable children: Secondary | ICD-10-CM | POA: Insufficient documentation

## 2014-11-19 DIAGNOSIS — E1169 Type 2 diabetes mellitus with other specified complication: Secondary | ICD-10-CM | POA: Insufficient documentation

## 2014-12-19 ENCOUNTER — Encounter: Payer: Self-pay | Admitting: Family Medicine

## 2014-12-19 ENCOUNTER — Ambulatory Visit (INDEPENDENT_AMBULATORY_CARE_PROVIDER_SITE_OTHER): Payer: Medicare Other | Admitting: Family Medicine

## 2014-12-19 ENCOUNTER — Observation Stay
Admission: EM | Admit: 2014-12-19 | Discharge: 2014-12-23 | Disposition: A | Discharge status: Home / Self Care | Payer: Medicare Other | Attending: Internal Medicine | Admitting: Internal Medicine

## 2014-12-19 ENCOUNTER — Emergency Department: Payer: Medicare Other

## 2014-12-19 ENCOUNTER — Encounter: Payer: Self-pay | Admitting: *Deleted

## 2014-12-19 VITALS — BP 130/80 | HR 88 | Temp 97.9°F | Ht 62.0 in | Wt 231.0 lb

## 2014-12-19 DIAGNOSIS — R55 Syncope and collapse: Secondary | ICD-10-CM | POA: Diagnosis not present

## 2014-12-19 DIAGNOSIS — R112 Nausea with vomiting, unspecified: Secondary | ICD-10-CM | POA: Diagnosis not present

## 2014-12-19 DIAGNOSIS — M62838 Other muscle spasm: Secondary | ICD-10-CM | POA: Insufficient documentation

## 2014-12-19 DIAGNOSIS — E780 Pure hypercholesterolemia: Secondary | ICD-10-CM | POA: Diagnosis not present

## 2014-12-19 DIAGNOSIS — Z8249 Family history of ischemic heart disease and other diseases of the circulatory system: Secondary | ICD-10-CM | POA: Diagnosis not present

## 2014-12-19 DIAGNOSIS — Z6841 Body Mass Index (BMI) 40.0 and over, adult: Secondary | ICD-10-CM | POA: Diagnosis not present

## 2014-12-19 DIAGNOSIS — I10 Essential (primary) hypertension: Secondary | ICD-10-CM | POA: Insufficient documentation

## 2014-12-19 DIAGNOSIS — Z87891 Personal history of nicotine dependence: Secondary | ICD-10-CM | POA: Insufficient documentation

## 2014-12-19 DIAGNOSIS — E785 Hyperlipidemia, unspecified: Secondary | ICD-10-CM | POA: Diagnosis not present

## 2014-12-19 DIAGNOSIS — F419 Anxiety disorder, unspecified: Secondary | ICD-10-CM | POA: Insufficient documentation

## 2014-12-19 DIAGNOSIS — M19012 Primary osteoarthritis, left shoulder: Secondary | ICD-10-CM | POA: Insufficient documentation

## 2014-12-19 DIAGNOSIS — E1142 Type 2 diabetes mellitus with diabetic polyneuropathy: Secondary | ICD-10-CM | POA: Insufficient documentation

## 2014-12-19 DIAGNOSIS — M818 Other osteoporosis without current pathological fracture: Secondary | ICD-10-CM | POA: Diagnosis not present

## 2014-12-19 DIAGNOSIS — E860 Dehydration: Secondary | ICD-10-CM | POA: Diagnosis not present

## 2014-12-19 DIAGNOSIS — J449 Chronic obstructive pulmonary disease, unspecified: Secondary | ICD-10-CM | POA: Insufficient documentation

## 2014-12-19 DIAGNOSIS — I700 Atherosclerosis of aorta: Secondary | ICD-10-CM | POA: Insufficient documentation

## 2014-12-19 DIAGNOSIS — E1140 Type 2 diabetes mellitus with diabetic neuropathy, unspecified: Secondary | ICD-10-CM | POA: Diagnosis not present

## 2014-12-19 DIAGNOSIS — K5730 Diverticulosis of large intestine without perforation or abscess without bleeding: Secondary | ICD-10-CM | POA: Diagnosis not present

## 2014-12-19 DIAGNOSIS — I739 Peripheral vascular disease, unspecified: Secondary | ICD-10-CM | POA: Insufficient documentation

## 2014-12-19 DIAGNOSIS — Z794 Long term (current) use of insulin: Secondary | ICD-10-CM | POA: Diagnosis not present

## 2014-12-19 DIAGNOSIS — Z9049 Acquired absence of other specified parts of digestive tract: Secondary | ICD-10-CM | POA: Insufficient documentation

## 2014-12-19 DIAGNOSIS — I872 Venous insufficiency (chronic) (peripheral): Secondary | ICD-10-CM | POA: Diagnosis not present

## 2014-12-19 DIAGNOSIS — K449 Diaphragmatic hernia without obstruction or gangrene: Secondary | ICD-10-CM | POA: Insufficient documentation

## 2014-12-19 DIAGNOSIS — K219 Gastro-esophageal reflux disease without esophagitis: Secondary | ICD-10-CM | POA: Insufficient documentation

## 2014-12-19 DIAGNOSIS — E559 Vitamin D deficiency, unspecified: Secondary | ICD-10-CM | POA: Insufficient documentation

## 2014-12-19 DIAGNOSIS — K429 Umbilical hernia without obstruction or gangrene: Secondary | ICD-10-CM | POA: Diagnosis not present

## 2014-12-19 DIAGNOSIS — Z91041 Radiographic dye allergy status: Secondary | ICD-10-CM | POA: Insufficient documentation

## 2014-12-19 DIAGNOSIS — K649 Unspecified hemorrhoids: Secondary | ICD-10-CM | POA: Insufficient documentation

## 2014-12-19 DIAGNOSIS — IMO0002 Reserved for concepts with insufficient information to code with codable children: Secondary | ICD-10-CM

## 2014-12-19 DIAGNOSIS — I517 Cardiomegaly: Secondary | ICD-10-CM | POA: Insufficient documentation

## 2014-12-19 DIAGNOSIS — R600 Localized edema: Secondary | ICD-10-CM | POA: Diagnosis not present

## 2014-12-19 DIAGNOSIS — K222 Esophageal obstruction: Secondary | ICD-10-CM | POA: Diagnosis not present

## 2014-12-19 DIAGNOSIS — Z885 Allergy status to narcotic agent status: Secondary | ICD-10-CM | POA: Insufficient documentation

## 2014-12-19 DIAGNOSIS — E039 Hypothyroidism, unspecified: Secondary | ICD-10-CM | POA: Diagnosis not present

## 2014-12-19 DIAGNOSIS — F329 Major depressive disorder, single episode, unspecified: Secondary | ICD-10-CM | POA: Diagnosis not present

## 2014-12-19 DIAGNOSIS — K922 Gastrointestinal hemorrhage, unspecified: Secondary | ICD-10-CM | POA: Diagnosis not present

## 2014-12-19 DIAGNOSIS — J45909 Unspecified asthma, uncomplicated: Secondary | ICD-10-CM | POA: Diagnosis not present

## 2014-12-19 DIAGNOSIS — R197 Diarrhea, unspecified: Secondary | ICD-10-CM | POA: Diagnosis not present

## 2014-12-19 DIAGNOSIS — K921 Melena: Secondary | ICD-10-CM | POA: Diagnosis not present

## 2014-12-19 DIAGNOSIS — R1084 Generalized abdominal pain: Secondary | ICD-10-CM

## 2014-12-19 DIAGNOSIS — J309 Allergic rhinitis, unspecified: Secondary | ICD-10-CM | POA: Insufficient documentation

## 2014-12-19 DIAGNOSIS — R1110 Vomiting, unspecified: Secondary | ICD-10-CM

## 2014-12-19 DIAGNOSIS — Z90710 Acquired absence of both cervix and uterus: Secondary | ICD-10-CM | POA: Diagnosis not present

## 2014-12-19 DIAGNOSIS — Z79899 Other long term (current) drug therapy: Secondary | ICD-10-CM | POA: Insufficient documentation

## 2014-12-19 DIAGNOSIS — R079 Chest pain, unspecified: Secondary | ICD-10-CM | POA: Insufficient documentation

## 2014-12-19 DIAGNOSIS — R05 Cough: Secondary | ICD-10-CM | POA: Insufficient documentation

## 2014-12-19 DIAGNOSIS — E1165 Type 2 diabetes mellitus with hyperglycemia: Secondary | ICD-10-CM | POA: Insufficient documentation

## 2014-12-19 DIAGNOSIS — R195 Other fecal abnormalities: Secondary | ICD-10-CM | POA: Diagnosis present

## 2014-12-19 LAB — URINALYSIS COMPLETE WITH MICROSCOPIC (ARMC ONLY)
BACTERIA UA: NONE SEEN
Bilirubin Urine: NEGATIVE
Glucose, UA: 50 mg/dL — AB
Hgb urine dipstick: NEGATIVE
Ketones, ur: NEGATIVE mg/dL
Nitrite: NEGATIVE
PH: 6 (ref 5.0–8.0)
PROTEIN: NEGATIVE mg/dL
Specific Gravity, Urine: 1.008 (ref 1.005–1.030)

## 2014-12-19 LAB — CBC WITH DIFFERENTIAL/PLATELET
Basophils Absolute: 0 10*3/uL (ref 0–0.1)
Basophils Relative: 0 %
EOS PCT: 1 %
Eosinophils Absolute: 0.1 10*3/uL (ref 0–0.7)
HEMATOCRIT: 40.3 % (ref 35.0–47.0)
HEMOGLOBIN: 13.2 g/dL (ref 12.0–16.0)
LYMPHS ABS: 0.9 10*3/uL — AB (ref 1.0–3.6)
LYMPHS PCT: 9 %
MCH: 28.7 pg (ref 26.0–34.0)
MCHC: 32.8 g/dL (ref 32.0–36.0)
MCV: 87.5 fL (ref 80.0–100.0)
Monocytes Absolute: 0.5 10*3/uL (ref 0.2–0.9)
Monocytes Relative: 5 %
NEUTROS ABS: 7.8 10*3/uL — AB (ref 1.4–6.5)
Neutrophils Relative %: 85 %
PLATELETS: 163 10*3/uL (ref 150–440)
RBC: 4.61 MIL/uL (ref 3.80–5.20)
RDW: 14.1 % (ref 11.5–14.5)
WBC: 9.3 10*3/uL (ref 3.6–11.0)

## 2014-12-19 LAB — C DIFFICILE QUICK SCREEN W PCR REFLEX
C DIFFICILE (CDIFF) INTERP: NEGATIVE
C DIFFICLE (CDIFF) ANTIGEN: NEGATIVE
C Diff toxin: NEGATIVE

## 2014-12-19 LAB — HEMOCCULT GUIAC POC 1CARD (OFFICE): Fecal Occult Blood, POC: POSITIVE — AB

## 2014-12-19 LAB — COMPREHENSIVE METABOLIC PANEL
ALT: 19 U/L (ref 14–54)
AST: 22 U/L (ref 15–41)
Albumin: 4.4 g/dL (ref 3.5–5.0)
Alkaline Phosphatase: 55 U/L (ref 38–126)
Anion gap: 9 (ref 5–15)
BUN: 14 mg/dL (ref 6–20)
CHLORIDE: 101 mmol/L (ref 101–111)
CO2: 23 mmol/L (ref 22–32)
CREATININE: 0.58 mg/dL (ref 0.44–1.00)
Calcium: 9.4 mg/dL (ref 8.9–10.3)
GFR calc non Af Amer: >60 mL/min (ref >60)
Glucose, Bld: 305 mg/dL — ABNORMAL HIGH (ref 65–99)
POTASSIUM: 4.3 mmol/L (ref 3.5–5.1)
SODIUM: 133 mmol/L — AB (ref 135–145)
Total Bilirubin: 1.1 mg/dL (ref 0.3–1.2)
Total Protein: 7.3 g/dL (ref 6.5–8.1)

## 2014-12-19 LAB — LIPASE, BLOOD: Lipase: 16 U/L — ABNORMAL LOW (ref 22–51)

## 2014-12-19 LAB — GLUCOSE, CAPILLARY: Glucose-Capillary: 278 mg/dL — ABNORMAL HIGH (ref 65–99)

## 2014-12-19 MED ORDER — ONDANSETRON HCL 4 MG/2ML IJ SOLN
4.0000 mg | Freq: Once | INTRAMUSCULAR | Status: AC
  Administered 2014-12-19 (×2): 4 mg via INTRAVENOUS
  Filled 2014-12-19: qty 2

## 2014-12-19 MED ORDER — ACETAMINOPHEN 325 MG PO TABS
650.0000 mg | ORAL_TABLET | Freq: Four times a day (QID) | ORAL | Status: DC | PRN
  Administered 2014-12-20 – 2014-12-23 (×4): 650 mg via ORAL
  Filled 2014-12-19 (×3): qty 2

## 2014-12-19 MED ORDER — GI COCKTAIL ~~LOC~~
30.0000 mL | ORAL | Status: AC
  Administered 2014-12-19: 30 mL via ORAL
  Filled 2014-12-19: qty 30

## 2014-12-19 MED ORDER — METHYLPREDNISOLONE SODIUM SUCC 125 MG IJ SOLR
125.0000 mg | Freq: Once | INTRAMUSCULAR | Status: AC
  Administered 2014-12-19: 125 mg via INTRAVENOUS
  Filled 2014-12-19: qty 2

## 2014-12-19 MED ORDER — MORPHINE SULFATE (PF) 2 MG/ML IV SOLN
2.0000 mg | INTRAVENOUS | Status: DC | PRN
  Administered 2014-12-20: 2 mg via INTRAVENOUS
  Filled 2014-12-19: qty 1

## 2014-12-19 MED ORDER — TIOTROPIUM BROMIDE MONOHYDRATE 18 MCG IN CAPS
1.0000 | ORAL_CAPSULE | Freq: Every day | RESPIRATORY_TRACT | Status: DC
  Administered 2014-12-20 – 2014-12-23 (×4): 18 ug via RESPIRATORY_TRACT
  Filled 2014-12-19: qty 5

## 2014-12-19 MED ORDER — METOCLOPRAMIDE HCL 5 MG/ML IJ SOLN
10.0000 mg | Freq: Once | INTRAMUSCULAR | Status: AC
  Administered 2014-12-19: 10 mg via INTRAVENOUS
  Filled 2014-12-19: qty 2

## 2014-12-19 MED ORDER — ACETAMINOPHEN 650 MG RE SUPP: 650.0000 mg | Freq: Four times a day (QID) | RECTAL | Status: DC | PRN

## 2014-12-19 MED ORDER — HYDRALAZINE HCL 20 MG/ML IJ SOLN
10.0000 mg | INTRAMUSCULAR | Status: DC | PRN
  Filled 2014-12-19: qty 1

## 2014-12-19 MED ORDER — SODIUM CHLORIDE 0.9 % IV SOLN
INTRAVENOUS | Status: DC
  Administered 2014-12-19 – 2014-12-21 (×4): via INTRAVENOUS

## 2014-12-19 MED ORDER — ONDANSETRON HCL 4 MG/2ML IJ SOLN
4.0000 mg | Freq: Once | INTRAMUSCULAR | Status: AC
  Administered 2014-12-19: 4 mg via INTRAVENOUS
  Filled 2014-12-19: qty 2

## 2014-12-19 MED ORDER — PANTOPRAZOLE SODIUM 40 MG IV SOLR
40.0000 mg | Freq: Two times a day (BID) | INTRAVENOUS | Status: DC
  Administered 2014-12-19 – 2014-12-23 (×8): 40 mg via INTRAVENOUS
  Filled 2014-12-19 (×8): qty 40

## 2014-12-19 MED ORDER — ONDANSETRON HCL 4 MG/2ML IJ SOLN
4.0000 mg | Freq: Four times a day (QID) | INTRAMUSCULAR | Status: DC | PRN
  Administered 2014-12-21: 4 mg via INTRAVENOUS
  Filled 2014-12-19: qty 2

## 2014-12-19 MED ORDER — INSULIN ASPART 100 UNIT/ML ~~LOC~~ SOLN
0.0000 [IU] | Freq: Every day | SUBCUTANEOUS | Status: DC
  Administered 2014-12-19 – 2014-12-20 (×2): 3 [IU] via SUBCUTANEOUS
  Administered 2014-12-21 – 2014-12-22 (×2): 2 [IU] via SUBCUTANEOUS
  Filled 2014-12-19 (×2): qty 3
  Filled 2014-12-19 (×2): qty 2
  Filled 2014-12-19: qty 3

## 2014-12-19 MED ORDER — ONDANSETRON HCL 4 MG PO TABS: 4.0000 mg | ORAL_TABLET | Freq: Four times a day (QID) | ORAL | Status: DC | PRN

## 2014-12-19 MED ORDER — ATORVASTATIN CALCIUM 20 MG PO TABS
20.0000 mg | ORAL_TABLET | Freq: Every day | ORAL | Status: DC
  Administered 2014-12-19 – 2014-12-22 (×4): 20 mg via ORAL
  Filled 2014-12-19 (×4): qty 1

## 2014-12-19 MED ORDER — ONDANSETRON HCL 4 MG/2ML IJ SOLN
INTRAMUSCULAR | Status: AC
  Administered 2014-12-19: 4 mg via INTRAVENOUS
  Filled 2014-12-19: qty 2

## 2014-12-19 MED ORDER — MOMETASONE FURO-FORMOTEROL FUM 100-5 MCG/ACT IN AERO
2.0000 | INHALATION_SPRAY | Freq: Two times a day (BID) | RESPIRATORY_TRACT | Status: DC
  Administered 2014-12-20 – 2014-12-23 (×8): 2 via RESPIRATORY_TRACT
  Filled 2014-12-19: qty 8.8

## 2014-12-19 MED ORDER — SODIUM CHLORIDE 0.9 % IV BOLUS (SEPSIS)
1000.0000 mL | Freq: Once | INTRAVENOUS | Status: AC
  Administered 2014-12-19: 1000 mL via INTRAVENOUS

## 2014-12-19 MED ORDER — LOSARTAN POTASSIUM 25 MG PO TABS
25.0000 mg | ORAL_TABLET | Freq: Every day | ORAL | Status: DC
  Administered 2014-12-20 – 2014-12-23 (×4): 25 mg via ORAL
  Filled 2014-12-19: qty 0.5
  Filled 2014-12-19 (×4): qty 1

## 2014-12-19 MED ORDER — OXYCODONE HCL 5 MG PO TABS: 5.0000 mg | ORAL_TABLET | ORAL | Status: DC | PRN

## 2014-12-19 MED ORDER — INSULIN ASPART 100 UNIT/ML ~~LOC~~ SOLN
0.0000 [IU] | Freq: Three times a day (TID) | SUBCUTANEOUS | Status: DC
  Administered 2014-12-20 (×2): 7 [IU] via SUBCUTANEOUS
  Administered 2014-12-20 – 2014-12-21 (×2): 3 [IU] via SUBCUTANEOUS
  Administered 2014-12-21: 5 [IU] via SUBCUTANEOUS
  Administered 2014-12-21 – 2014-12-22 (×2): 3 [IU] via SUBCUTANEOUS
  Administered 2014-12-22: 5 [IU] via SUBCUTANEOUS
  Administered 2014-12-22 – 2014-12-23 (×2): 3 [IU] via SUBCUTANEOUS
  Administered 2014-12-23: 5 [IU] via SUBCUTANEOUS
  Filled 2014-12-19: qty 5
  Filled 2014-12-19: qty 3
  Filled 2014-12-19: qty 7
  Filled 2014-12-19 (×2): qty 3
  Filled 2014-12-19: qty 5
  Filled 2014-12-19 (×2): qty 3
  Filled 2014-12-19: qty 7
  Filled 2014-12-19: qty 5

## 2014-12-19 MED ORDER — DIPHENHYDRAMINE HCL 50 MG/ML IJ SOLN
50.0000 mg | Freq: Once | INTRAMUSCULAR | Status: AC
  Administered 2014-12-19: 50 mg via INTRAVENOUS
  Filled 2014-12-19: qty 1

## 2014-12-19 MED ORDER — KETOROLAC TROMETHAMINE 30 MG/ML IJ SOLN
30.0000 mg | Freq: Once | INTRAMUSCULAR | Status: AC
  Administered 2014-12-19: 30 mg via INTRAVENOUS
  Filled 2014-12-19: qty 1

## 2014-12-19 MED ORDER — IOHEXOL 350 MG/ML SOLN
100.0000 mL | Freq: Once | INTRAVENOUS | Status: AC | PRN
  Administered 2014-12-19: 100 mL via INTRAVENOUS

## 2014-12-19 NOTE — Progress Notes (Signed)
Name: Molly Hoover   MRN: [id number]    DOB: [birth year]   Date:12/19/2014       Progress Note  Subjective  Chief Complaint  Chief Complaint  Patient presents with  . Abdominal Pain    cramping with diarrhea and vomiting    Abdominal Pain This is a recurrent problem. The current episode started 1 to 4 weeks ago. The onset quality is sudden. The problem occurs constantly. The problem has been gradually worsening. The pain is located in the generalized abdominal region. The pain is moderate. The quality of the pain is colicky. The abdominal pain does not radiate. Associated symptoms include diarrhea, hematochezia, hematuria, nausea and vomiting. Pertinent negatives include no anorexia, constipation, dysuria, fever, frequency, headaches, melena, myalgias or weight loss. Nothing aggravates the pain. The pain is relieved by nothing. The treatment provided no relief. There is no history of abdominal surgery, colon cancer, Crohn's disease, gallstones, GERD, irritable bowel syndrome, pancreatitis, PUD or ulcerative colitis.    No problem-specific assessment & plan notes found for this encounter.   Past Medical History  Diagnosis Date  . Hypertension   . Diabetes mellitus without complication   . Hiatal hernia   . GERD (gastroesophageal reflux disease)   . Hyperlipidemia   . Degenerative disc disease   . Anxiety   . Depression   . Allergic rhinitis   . COPD (chronic obstructive pulmonary disease)   . Diabetic peripheral neuropathy   . Hypothyroidism   . Vitamin D deficiency   . Bilateral breast cysts   . Asthma   . Diverticulosis   . PVD (peripheral vascular disease)   . Enlarged heart   . Syncope and collapse     Past Surgical History  Procedure Laterality Date  . Cholecystectomy    . Total abdominal hysterectomy    . Cervical fusion  2002  . Lumbar fusion      L1-4   . Carpal tunnel release      right  . Hernia repair    . Breast biopsy    .  Esophagogastroduodenoscopy  03/2014    benign stricture dilated    Family History  Problem Relation Age of Onset  . Heart attack Mother 62  . Hypertension Mother   . Heart attack Father     Social History   Social History  . Marital Status: Married    Spouse Name: N/A  . Number of Children: N/A  . Years of Education: N/A   Occupational History  . Not on file.   Social History Main Topics  . Smoking status: Former Smoker -- 15 years    Types: Cigarettes  . Smokeless tobacco: Not on file  . Alcohol Use: 1.2 oz/week    2 Standard drinks or equivalent per week     Comment: occasional  . Drug Use: No  . Sexual Activity: No   Other Topics Concern  . Not on file   Social History Narrative    Allergies  Allergen Reactions  . Codeine   . Combivent [Ipratropium-Albuterol]     itching  . Ivp Dye [Iodinated Diagnostic Agents]      Review of Systems  Constitutional: Positive for chills and malaise/fatigue. Negative for fever and weight loss.  HENT: Negative for ear discharge, ear pain and sore throat.   Eyes: Negative for blurred vision.  Respiratory: Negative for cough, sputum production, shortness of breath and wheezing.   Cardiovascular: Negative for chest pain, palpitations and leg swelling.  Gastrointestinal: Positive  for nausea, vomiting, abdominal pain, diarrhea, blood in stool and hematochezia. Negative for heartburn, constipation, melena and anorexia.  Genitourinary: Positive for hematuria. Negative for dysuria, urgency and frequency.  Musculoskeletal: Negative for myalgias, back pain, joint pain and neck pain.  Skin: Negative for rash.  Neurological: Negative for dizziness, tingling, sensory change, focal weakness and headaches.  Endo/Heme/Allergies: Negative for environmental allergies and polydipsia. Does not bruise/bleed easily.  Psychiatric/Behavioral: Negative for depression and suicidal ideas. The patient is not nervous/anxious and does not have insomnia.       Objective  Filed Vitals:   12/19/14 1506  BP: 130/80  Pulse: 88  Temp: 97.9 F (36.6 C)  TempSrc: Oral  Height: 5\' 2"  (1.575 m)  Weight: 231 lb (104.781 kg)    Physical Exam  Constitutional: She is well-developed, well-nourished, and in no distress. No distress.  HENT:  Head: Normocephalic and atraumatic.  Right Ear: External ear normal.  Left Ear: External ear normal.  Nose: Nose normal.  Mouth/Throat: Oropharynx is clear and moist.  Eyes: Conjunctivae and EOM are normal. Pupils are equal, round, and reactive to light. Right eye exhibits no discharge. Left eye exhibits no discharge.  Neck: Normal range of motion. Neck supple. No JVD present. No thyromegaly present.  Cardiovascular: Normal rate, regular rhythm, normal heart sounds and intact distal pulses.  Exam reveals no gallop and no friction rub.   No murmur heard. Pulmonary/Chest: Effort normal and breath sounds normal.  Abdominal: Soft. Bowel sounds are normal. She exhibits distension. She exhibits no mass. There is tenderness. There is guarding.  Musculoskeletal: Normal range of motion. She exhibits no edema.  Lymphadenopathy:    She has no cervical adenopathy.  Neurological: She is alert. She has normal reflexes.  Skin: Skin is warm and dry. She is not diaphoretic.  Psychiatric: Mood and affect normal.      Assessment & Plan  Problem List Items Addressed This Visit    None    Visit Diagnoses    Intractable vomiting with nausea, vomiting of unspecified type    -  Primary    guiac positive/ ? ischemic bowel/referral to er    Relevant Orders    POCT Occult Blood Stool (Completed)    Dehydration             Dr. Otilio Miu [facility]  12/19/2014

## 2014-12-19 NOTE — ED Notes (Signed)
Pt up to bedside commode without incident.

## 2014-12-19 NOTE — ED Provider Notes (Signed)
[facility] Emergency Department Provider Note  ____________________________________________  Time seen: 4:25 PM on arrival by EMS  I have reviewed the triage vital signs and the nursing notes.   HISTORY  Chief Complaint Abdominal Pain    HPI Molly Hoover is a 69 y.o. female who complains of generalized abdominal pain for about 2 weeks. With this she is also having some nausea vomiting and diarrhea over the last several days. No blood in the vomit or diarrhea. No chest pain shortness breath fever chills dizziness or syncope. Denies dysuria frequency urgency. Denies having this before.     Past Medical History  Diagnosis Date  . Hypertension   . Diabetes mellitus without complication   . Hiatal hernia   . GERD (gastroesophageal reflux disease)   . Hyperlipidemia   . Degenerative disc disease   . Anxiety   . Depression   . Allergic rhinitis   . COPD (chronic obstructive pulmonary disease)   . Diabetic peripheral neuropathy   . Hypothyroidism   . Vitamin D deficiency   . Bilateral breast cysts   . Asthma   . Diverticulosis   . PVD (peripheral vascular disease)   . Enlarged heart   . Syncope and collapse     Patient Active Problem List   Diagnosis Date Noted  . COPD, moderate 11/19/2014  . Edema extremities 11/19/2014  . H/O gastrointestinal disease 11/19/2014  . Combined fat and carbohydrate induced hyperlipemia 11/19/2014  . Muscle spasms of head and/or neck 11/19/2014  . Idiopathic osteoporosis 11/19/2014  . 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 Diabetes mellitus type 2, uncontrolled 11/19/2014  . Venous insufficiency of leg 11/19/2014  . SOB (shortness of breath) 10/17/2013  . Morbid obesity 10/17/2013  . Essential hypertension 10/17/2013  . Chronic cough 10/17/2013  . Chest pain on exertion 10/17/2013    Past Surgical History  Procedure Laterality Date  . Cholecystectomy    . Total abdominal hysterectomy    .  Cervical fusion  2002  . Lumbar fusion      L1-4   . Carpal tunnel release      right  . Hernia repair    . Breast biopsy    . Esophagogastroduodenoscopy  03/2014    benign stricture dilated    Current Outpatient Rx  Name  Route  Sig  Dispense  Refill  . alendronate (FOSAMAX) 70 MG tablet   Oral   Take 70 mg by mouth once a week. Take with a full glass of water on an empty stomach.         Marland Kitchen aspirin EC 81 MG tablet   Oral   Take 1 tablet by mouth daily at 6 (six) AM.         . atorvastatin (LIPITOR) 20 MG tablet   Oral   Take 1 tablet by mouth at bedtime.         . Cranberry 500 MG CAPS   Oral   Take by mouth daily.         . furosemide (LASIX) 40 MG tablet   Oral   Take 40 mg by mouth 2 (two) times daily.         Marland Kitchen gemfibrozil (LOPID) 600 MG tablet   Oral   Take 1 tablet by mouth 2 (two) times daily.         Marland Kitchen GLUCOSE BLOOD VI               . losartan (COZAAR) 25  MG tablet      TAKE 1 TABLET DAILY   90 tablet   2   . metFORMIN (GLUCOPHAGE) 500 MG tablet   Oral   Take 1 tablet by mouth 2 (two) times daily.         . mometasone-formoterol (DULERA) 100-5 MCG/ACT AERO   Inhalation   Inhale 2 puffs into the lungs 2 (two) times daily.         Marland Kitchen omega-3 acid ethyl esters (LOVAZA) 1 G capsule      TAKE 1 CAPSULE TWICE A DAY   180 capsule   3   . omeprazole (PRILOSEC) 20 MG capsule   Oral   Take 1 capsule by mouth daily.         . sitaGLIPtin (JANUVIA) 100 MG tablet   Oral   Take 1 tablet by mouth daily.         Marland Kitchen SPIRIVA HANDIHALER 18 MCG inhalation capsule      INHALE THE CONTENTS OF 1 CAPSULE DAILY   90 capsule   2   . Vitamin D, Ergocalciferol, (DRISDOL) 50000 UNITS CAPS capsule   Oral   Take 50,000 Units by mouth every 7 (seven) days.           Allergies Codeine; Combivent; and Ivp dye  Family History  Problem Relation Age of Onset  . Heart attack Mother 38  . Hypertension Mother   . Heart attack Father      Social History Social History  Substance Use Topics  . Smoking status: Former Smoker -- 15 years    Types: Cigarettes  . Smokeless tobacco: None  . Alcohol Use: 1.2 oz/week    2 Standard drinks or equivalent per week     Comment: occasional    Review of Systems  Constitutional: No fever or chills. No weight changes Eyes:No blurry vision or double vision.  ENT: No sore throat. Cardiovascular: No chest pain. Respiratory: No dyspnea or cough. Gastrointestinal: Positive abdominal pain with vomiting and diarrhea.  No BRBPR or melena. Genitourinary: Negative for dysuria, urinary retention, bloody urine, or difficulty urinating. Musculoskeletal: Negative for back pain. No joint swelling or pain. Skin: Negative for rash. Neurological: Negative for headaches, focal weakness or numbness. Psychiatric:No anxiety or depression.   Endocrine:No hot/cold intolerance, changes in energy, or sleep difficulty.  10-point ROS otherwise negative.  ____________________________________________   PHYSICAL EXAM:  VITAL SIGNS: ED Triage Vitals  Enc Vitals Group     BP 12/19/14 1650 189/83 mmHg     Pulse Rate 12/19/14 1650 69     Resp 12/19/14 1650 18     Temp 12/19/14 1650 97.2 F (36.2 C)     Temp Source 12/19/14 1650 Oral     SpO2 12/19/14 1625 96 %     Weight --      Height --      Head Cir --      Peak Flow --      Pain Score 12/19/14 1630 4     Pain Loc --      Pain Edu? --      Excl. in [place]? --      Constitutional: Alert and oriented. Well appearing and in no distress. Eyes: No scleral icterus. No conjunctival pallor. PERRL. EOMI ENT   Head: Normocephalic and atraumatic.   Nose: No congestion/rhinnorhea. No septal hematoma   Mouth/Throat: MMM, no pharyngeal erythema. No peritonsillar mass. No uvula shift.   Neck: No stridor. No SubQ emphysema. No meningismus. Hematological/Lymphatic/Immunilogical:  No cervical lymphadenopathy. Cardiovascular: RRR. Normal and  symmetric distal pulses are present in all extremities. No murmurs, rubs, or gallops. Respiratory: Normal respiratory effort without tachypnea nor retractions. Breath sounds are clear and equal bilaterally. No wheezes/rales/rhonchi. Gastrointestinal: Soft with epigastric and suprapubic tenderness. No distention. There is no CVA tenderness.  No rebound, rigidity, or guarding. No bruit or pulsatile mass. Rectal exam reveals thin liquid stool that is Hemoccult positive Genitourinary: deferred Musculoskeletal: Nontender with normal range of motion in all extremities. No joint effusions.  No lower extremity tenderness.  No edema. Neurologic:   Normal speech and language.  CN 2-10 normal. Motor grossly intact. No pronator drift.  Normal gait. No gross focal neurologic deficits are appreciated.  Skin:  Skin is warm, dry and intact. No rash noted.  No petechiae, purpura, or bullae. Psychiatric: Mood and affect are normal. Speech and behavior are normal. Patient exhibits appropriate insight and judgment.  ____________________________________________    LABS (pertinent positives/negatives) (all labs ordered are listed, but only abnormal results are displayed) Labs Reviewed  COMPREHENSIVE METABOLIC PANEL - Abnormal; Notable for the following:    Sodium 133 (*)    Glucose, Bld 305 (*)    All other components within normal limits  LIPASE, BLOOD - Abnormal; Notable for the following:    Lipase 16 (*)    All other components within normal limits  CBC WITH DIFFERENTIAL/PLATELET - Abnormal; Notable for the following:    Neutro Abs 7.8 (*)    Lymphs Abs 0.9 (*)    All other components within normal limits  URINALYSIS COMPLETEWITH MICROSCOPIC ([facility] ONLY) - Abnormal; Notable for the following:    Color, Urine STRAW (*)    APPearance CLEAR (*)    Glucose, UA 50 (*)    Leukocytes, UA TRACE (*)    Squamous Epithelial / LPF 0-5 (*)    All other components within normal limits  C DIFFICILE QUICK SCREEN  W PCR REFLEX   ____________________________________________   EKG    ____________________________________________    RADIOLOGY  Abdominal x-ray series unremarkable CT angiogram of the abdomen unremarkable. Diverticulosis without diverticulitis ____________________________________________   PROCEDURES  ____________________________________________   INITIAL IMPRESSION / ASSESSMENT AND PLAN / ED COURSE  Pertinent labs & imaging results that were available during my care of the patient were reviewed by me and considered in my medical decision making (see chart for details).  Patient is well-appearing, presenting with GI symptoms.  We'll get an abdominal x-ray series for further evaluation along with labs. Overall she is well appearing and I do not think that she's having mesenteric ischemia AAA perforation or obstruction or other surgical pathology. This may be related to her hiatal hernia and history of GERD.    ----------------------------------------- 7:45 PM on 12/19/2014 -----------------------------------------  During her time in the ED, the patient has had progressively worsening symptoms including frequent diarrhea and vomiting. On further discussion her IV contrast allergy is related to his scan was done 30 years ago when she only had hives at that time. Did not have any respiratory compromise or anaphylaxis per the patient's report. Due to the progressive symptoms, we will pursue a CT angiogram of the abdomen to evaluate for mesenteric ischemia. We'll premedicate with slight Medrol and IV Benadryl. I think the risk of a serious reaction is low with a time interval since her previous reaction and the change in IV contrast agents used in that time, and the benefits of obtaining an adequate workup outweigh the risks. ----------------------------------------- 9:10 PM  on 12/19/2014 -----------------------------------------  Patient remains stable. CT angiogram  unremarkable. Still severely nauseated and unable to tolerate oral intake. We'll discuss with hospitalist for admission. C. difficile negative ____________________________________________   FINAL CLINICAL IMPRESSION(S) / ED DIAGNOSES  Final diagnoses:  Generalized abdominal pain  Acute GI bleeding   intractable vomiting    Carrie Mew, MD 12/19/14 2119

## 2014-12-19 NOTE — ED Notes (Signed)
Pt's niece, Ivin Booty, can be reached at [phone].

## 2014-12-19 NOTE — ED Notes (Signed)
MD at bedside for reeval

## 2014-12-19 NOTE — ED Notes (Signed)
Ice water provided to patient. Pt tolerated well. Pt ambulatory to bedside commode without incident. Family remains at bedside. Warm blanket provided to patient. ER MD at bedside for re eval. Pt AAOx4. NAD noted. RR even and nonlabored. Will continue to monitor.

## 2014-12-19 NOTE — ED Notes (Signed)
Assumed pt care at this time. NAD noted. RR even and nonlabored. Will continue to monitor. 

## 2014-12-19 NOTE — H&P (Signed)
[facility] [place] NAME: Molly Hoover    MR#:  [id number]  DATE OF BIRTH:  08/04/1945   DATE OF ADMISSION:  12/19/2014  PRIMARY CARE PHYSICIAN: Halina Maidens, MD   REQUESTING/REFERRING PHYSICIAN: Joni Fears  CHIEF COMPLAINT:   Chief Complaint  Patient presents with  . Abdominal Pain    HISTORY OF PRESENT ILLNESS:  Molly Hoover  is a 69 y.o. female with a known history of type 2 diabetes and non-insulin requiring, essential hypertension, diverticulosis presenting with abdominal pain, vomiting, nausea. She states that she had approximately 3 weeks of nausea/vomiting/diarrhea/abdominal pain. The original onset coincided after she took 5 days' worth of meloxicam. After that she noticed abdominal cramping with associated watery diarrhea and vomiting of nonbloody/nonbilious emesis. She discontinued the medication and had improvement of her symptoms over the next few days however, her symptoms returned periodically throughout the next couple weeks. She is now complaining of intermittent abdominal pain periumbilical in location, "pain" in quality, 7/10 intensity, nonradiating, no worsening or relieving factors. Associated with vomiting and nonbloody nonbilious emesis multiple bouts daily, diarrhea which she states now has some blood present. Given her symptoms she saw her PCP who recommended presentation to the hospital given fecal occult blood test positive.  PAST MEDICAL HISTORY:   Past Medical History  Diagnosis Date  . Hypertension   . Diabetes mellitus without complication   . Hiatal hernia   . GERD (gastroesophageal reflux disease)   . Hyperlipidemia   . Degenerative disc disease   . Anxiety   . Depression   . Allergic rhinitis   . COPD (chronic obstructive pulmonary disease)   . Diabetic peripheral neuropathy   . Hypothyroidism   . Vitamin D deficiency   . Bilateral breast cysts   . Asthma   . Diverticulosis   . PVD  (peripheral vascular disease)   . Enlarged heart   . Syncope and collapse     PAST SURGICAL HISTORY:   Past Surgical History  Procedure Laterality Date  . Cholecystectomy    . Total abdominal hysterectomy    . Cervical fusion  2002  . Lumbar fusion      L1-4   . Carpal tunnel release      right  . Hernia repair    . Breast biopsy    . Esophagogastroduodenoscopy  03/2014    benign stricture dilated    SOCIAL HISTORY:   Social History  Substance Use Topics  . Smoking status: Former Smoker -- 15 years    Types: Cigarettes  . Smokeless tobacco: Not on file  . Alcohol Use: 1.2 oz/week    2 Standard drinks or equivalent per week     Comment: occasional    FAMILY HISTORY:   Family History  Problem Relation Age of Onset  . Heart attack Mother 61  . Hypertension Mother   . Heart attack Father     DRUG ALLERGIES:   Allergies  Allergen Reactions  . Codeine Hives and Itching  . Combivent [Ipratropium-Albuterol]     itching  . Ivp Dye [Iodinated Diagnostic Agents]     REVIEW OF SYSTEMS:  REVIEW OF SYSTEMS:  CONSTITUTIONAL: Denies fevers, chills, positive fatigue, weakness.  EYES: Denies blurred vision, double vision, or eye pain.  EARS, NOSE, THROAT: Denies tinnitus, ear pain, hearing loss.  RESPIRATORY: denies cough, shortness of breath, wheezing  CARDIOVASCULAR: Denies chest pain, palpitations, edema.  GASTROINTESTINAL: Positive nausea, vomiting, diarrhea, abdominal pain.  GENITOURINARY: Denies  dysuria, hematuria.  ENDOCRINE: Denies nocturia or thyroid problems. HEMATOLOGIC AND LYMPHATIC: Denies easy bruising or bleeding.  SKIN: Denies rash or lesions.  MUSCULOSKELETAL: Denies pain in neck, back, shoulder, knees, hips, or further arthritic symptoms.  NEUROLOGIC: Denies paralysis, paresthesias.  PSYCHIATRIC: Denies anxiety or depressive symptoms. Otherwise full review of systems performed by me is negative.   MEDICATIONS AT HOME:   Prior to Admission  medications   Medication Sig Start Date End Date Taking? Authorizing Provider  alendronate (FOSAMAX) 70 MG tablet Take 70 mg by mouth once a week. Take with a full glass of water on an empty stomach.   Yes Historical Provider, MD  atorvastatin (LIPITOR) 20 MG tablet Take 1 tablet by mouth at bedtime. 08/28/14  Yes Historical Provider, MD  Cranberry 500 MG CAPS Take 1 capsule by mouth daily.    Yes Historical Provider, MD  gemfibrozil (LOPID) 600 MG tablet Take 1 tablet by mouth 2 (two) times daily.   Yes Historical Provider, MD  losartan (COZAAR) 25 MG tablet TAKE 1 TABLET DAILY 10/16/14  Yes Glean Hess, MD  metFORMIN (GLUCOPHAGE) 500 MG tablet Take 1 tablet by mouth 2 (two) times daily. 03/17/14  Yes Historical Provider, MD  mometasone-formoterol (DULERA) 100-5 MCG/ACT AERO Inhale 2 puffs into the lungs 2 (two) times daily.   Yes Historical Provider, MD  omega-3 acid ethyl esters (LOVAZA) 1 G capsule TAKE 1 CAPSULE TWICE A DAY 10/14/14  Yes Glean Hess, MD  omeprazole (PRILOSEC) 20 MG capsule Take 1 capsule by mouth daily. 08/27/14  Yes Historical Provider, MD  sitaGLIPtin (JANUVIA) 100 MG tablet Take 1 tablet by mouth daily. 08/28/14  Yes Historical Provider, MD  SPIRIVA HANDIHALER 18 MCG inhalation capsule INHALE THE CONTENTS OF 1 CAPSULE DAILY 10/16/14  Yes Glean Hess, MD  Vitamin D, Ergocalciferol, (DRISDOL) 50000 UNITS CAPS capsule Take 50,000 Units by mouth every 7 (seven) days.   Yes Historical Provider, MD      VITAL SIGNS:  Blood pressure 131/72, pulse 67, temperature 97.2 F (36.2 C), temperature source Oral, resp. rate 18, SpO2 100 %.  PHYSICAL EXAMINATION:  VITAL SIGNS: Filed Vitals:   12/19/14 2300  BP: 131/72  Pulse: 67  Temp:   Resp: 18   GENERAL:68 y.o.female currently in no acute distress.  HEAD: Normocephalic, atraumatic.  EYES: Pupils equal, round, reactive to light. Extraocular muscles intact. No scleral icterus.  MOUTH: Moist mucosal membrane. Dentition  intact. No abscess noted.  EAR, NOSE, THROAT: Clear without exudates. No external lesions.  NECK: Supple. No thyromegaly. No nodules. No JVD.  PULMONARY: Clear to ascultation, without wheeze rails or rhonci. No use of accessory muscles, Good respiratory effort. good air entry bilaterally CHEST: Nontender to palpation.  CARDIOVASCULAR: S1 and S2. Regular rate and rhythm. No murmurs, rubs, or gallops. No edema. Pedal pulses 2+ bilaterally.  GASTROINTESTINAL: Soft, minimal tenderness to palpation suprapubic/periumbilical without rebound or guarding, nondistended. No masses. Positive bowel sounds. No hepatosplenomegaly.  MUSCULOSKELETAL: No swelling, clubbing, or edema. Range of motion full in all extremities.  NEUROLOGIC: Cranial nerves II through XII are intact. No gross focal neurological deficits. Sensation intact. Reflexes intact.  SKIN: No ulceration, lesions, rashes, or cyanosis. Skin warm and dry. Turgor intact.  PSYCHIATRIC: Mood, affect within normal limits. The patient is awake, alert and oriented x 3. Insight, judgment intact.    LABORATORY PANEL:   CBC  Recent Labs Lab 12/19/14 1642  WBC 9.3  HGB 13.2  HCT 40.3  PLT 163   ------------------------------------------------------------------------------------------------------------------  Chemistries   Recent Labs Lab 12/19/14 1642  NA 133*  K 4.3  CL 101  CO2 23  GLUCOSE 305*  BUN 14  CREATININE 0.58  CALCIUM 9.4  AST 22  ALT 19  ALKPHOS 55  BILITOT 1.1   ------------------------------------------------------------------------------------------------------------------  Cardiac Enzymes No results for input(s): TROPONINI in the last 168 hours. ------------------------------------------------------------------------------------------------------------------  RADIOLOGY:  Ct Angio Abdomen W/cm &/or Wo Contrast  12/19/2014   CLINICAL DATA:  Generalized abdominal pain, vomiting, and diarrhea. Recurrent problem  with sudden onset. Colicky pain.  EXAM: CT ANGIOGRAPHY ABDOMEN  TECHNIQUE: Multidetector CT imaging of the abdomen was performed using the standard protocol during bolus administration of intravenous contrast. Multiplanar reconstructed images including MIPs were obtained and reviewed to evaluate the vascular anatomy.  CONTRAST:  185mL OMNIPAQUE IOHEXOL 350 MG/ML SOLN  COMPARISON:  None.  FINDINGS: Slight fibrosis in the lung bases.  Normal caliber abdominal aorta. Diffuse calcific and noncalcific atherosclerotic changes. No aortic dissection or aneurysm. The abdominal aorta, celiac axis, superior mesenteric artery, single right and duplicated left renal arteries, inferior mesenteric artery, and bilateral iliac, external iliac, internal iliac, and common femoral arteries are patent. No critical stenosis is demonstrated. Hepatic veins, portal veins, splenic veins, and superior mesenteric veins are patent. Renal nephrograms are symmetrical.  Surgical absence of the gallbladder. The liver, spleen, pancreas, adrenal glands, inferior vena cava, and retroperitoneal lymph nodes are unremarkable. Sub cm low-attenuation lesions in the kidneys likely represent small cysts. No hydronephrosis. Stomach, small bowel, and colon are not abnormally distended. No free air or free fluid in the abdomen. Small umbilical hernia containing fat.  Pelvis: The appendix is normal. Diverticulosis in the sigmoid colon without evidence of diverticulitis. Bladder is decompressed. Uterus is surgically absent. No free or loculated pelvic fluid collections. No pelvic mass or lymphadenopathy. Postoperative changes with posterior laminectomies and posterior rod and screw fixation from L4 to the sacrum. Degenerative changes throughout the lumbar spine. No destructive bone lesions demonstrated.  Review of the MIP images confirms the above findings.  IMPRESSION: Atherosclerotic changes in the abdominal aorta. No evidence of aneurysm, dissection, or  occlusion of the abdominal aorta, branch vessels, or portal venous system.   Electronically Signed   By: Lucienne Capers M.D.   On: 12/19/2014 21:03   Dg Abd Acute W/chest  12/19/2014   CLINICAL DATA:  Intermittent abdominal pain since August 9. Diarrhea with medications. Sent for further evaluation by primary physician. Generalized abdominal cramping.  EXAM: DG ABDOMEN ACUTE W/ 1V CHEST  COMPARISON:  Chest 06/19/2014  FINDINGS: Cardiac enlargement with borderline pulmonary vascularity. No significant edema or consolidation. No blunting of costophrenic angles. No pneumothorax. Degenerative changes in the shoulders. Postoperative change in the cervical spine.  Scattered gas and stool throughout the colon. No small or large bowel distention. No free intra-abdominal air. No abnormal air-fluid levels. Surgical clips in the right upper quadrant. Postoperative changes in the lower lumbar spine. No radiopaque stones.  IMPRESSION: Cardiac enlargement. No evidence of edema or consolidation. Normal nonobstructive bowel gas pattern.   Electronically Signed   By: Lucienne Capers M.D.   On: 12/19/2014 18:05    EKG:   Orders placed or performed in visit on 06/19/14  . EKG 12-Lead    IMPRESSION AND PLAN:   69 year old Caucasian female history of type 2 diabetes non-insulin-requiring, essential hypertension, diverticulosis presenting with nausea/vomiting/diarrhea/abdominal pain.  1. Intractable nausea and vomiting, unspecified: IV fluid hydration, Zofran as needed, will consult gastroenterology further further input 2. Fecal occult  blood test positive: Given history of Mobitz usage question gastritis, initiate PPI therapy, trend CBC will transfuse hemoglobin if less than 7 3. Type 2 diabetes, uncomplicated: Hold oral agents, add insulin sliding scale 4. Essential hypertension: Losartan, add hydralazine when necessary 5. Venous thromboembolism prophylactic: SCD    All the records are reviewed and case  discussed with ED provider. Management plans discussed with the patient, family and they are in agreement.  CODE STATUS: Full  TOTAL TIME TAKING CARE OF THIS PATIENT: 35 minutes.    Riyah Bardon,  Karenann Cai.D on 12/19/2014 at 11:03 PM  Between 7am to 6pm - Pager - [phone]  After 6pm: House Pager: - [phone]  [facility]  Office  [phone]  CC: Primary care physician; Halina Maidens, MD

## 2014-12-19 NOTE — ED Notes (Signed)
Pt sent from Dr Ronnald Ramp office, for abd pain.  Pt had been taking Meloxicam, began having diarrhea, stopped meds, seen PMD sent here for eval.

## 2014-12-20 LAB — BASIC METABOLIC PANEL
ANION GAP: 8 (ref 5–15)
BUN: 13 mg/dL (ref 6–20)
CHLORIDE: 105 mmol/L (ref 101–111)
CO2: 24 mmol/L (ref 22–32)
Calcium: 8.4 mg/dL — ABNORMAL LOW (ref 8.9–10.3)
Creatinine, Ser: 0.67 mg/dL (ref 0.44–1.00)
GFR calc non Af Amer: >60 mL/min (ref >60)
Glucose, Bld: 346 mg/dL — ABNORMAL HIGH (ref 65–99)
Potassium: 4.7 mmol/L (ref 3.5–5.1)
Sodium: 137 mmol/L (ref 135–145)

## 2014-12-20 LAB — CBC
HEMATOCRIT: 37.8 % (ref 35.0–47.0)
HEMOGLOBIN: 12.5 g/dL (ref 12.0–16.0)
MCH: 29.1 pg (ref 26.0–34.0)
MCHC: 33.1 g/dL (ref 32.0–36.0)
MCV: 88 fL (ref 80.0–100.0)
Platelets: 148 10*3/uL — ABNORMAL LOW (ref 150–440)
RBC: 4.3 MIL/uL (ref 3.80–5.20)
RDW: 14.3 % (ref 11.5–14.5)
WBC: 6.5 10*3/uL (ref 3.6–11.0)

## 2014-12-20 LAB — GLUCOSE, CAPILLARY
GLUCOSE-CAPILLARY: 328 mg/dL — AB (ref 65–99)
GLUCOSE-CAPILLARY: 307 mg/dL — AB (ref 65–99)
GLUCOSE-CAPILLARY: 228 mg/dL — AB (ref 65–99)
Glucose-Capillary: 255 mg/dL — ABNORMAL HIGH (ref 65–99)

## 2014-12-20 LAB — HEMOGLOBIN A1C: Hgb A1c MFr Bld: 10 % — ABNORMAL HIGH (ref 4.0–6.0)

## 2014-12-20 MED ORDER — INSULIN DETEMIR 100 UNIT/ML ~~LOC~~ SOLN
15.0000 [IU] | Freq: Every day | SUBCUTANEOUS | Status: DC
  Administered 2014-12-20 – 2014-12-21 (×2): 15 [IU] via SUBCUTANEOUS
  Filled 2014-12-20 (×5): qty 0.15

## 2014-12-20 MED ORDER — GLUCERNA SHAKE PO LIQD
237.0000 mL | Freq: Three times a day (TID) | ORAL | Status: DC
  Administered 2014-12-21 – 2014-12-23 (×5): 237 mL via ORAL

## 2014-12-20 NOTE — Progress Notes (Signed)
Inpatient Diabetes Program Recommendations  AACE/ADA: New Consensus Statement on Inpatient Glycemic Control (2013)  Target Ranges:  Prepandial:   less than 140 mg/dL      Peak postprandial:   less than 180 mg/dL (1-2 hours)      Critically ill patients:  140 - 180 mg/dL  Results for HELAINA, [name] (MRN [id number]) as of 12/20/2014 12:51  Ref. Range 12/19/2014 23:24 12/20/2014 07:53 12/20/2014 11:32  Glucose-Capillary Latest Ref Range: 65-99 mg/dL 278 (H) 328 (H) 307 (H)   Results for Molly Hoover, Molly Hoover (MRN [id number]) as of 12/20/2014 12:51  Ref. Range 08/27/2014 00:00  Hemoglobin A1C Latest Ref Range: 4.0-6.0 % 11.5 (A)   Diabetes history: DM2 Outpatient Diabetes medications: Metformin 500 mg BID, Januvia 100 mg daily Current orders for Inpatient glycemic control: Novolog 0-9 units TID with meals, Novolog 0-5 units HS  Inpatient Diabetes Program Recommendations Insulin - Basal: Initial glucose 305 mg/dl and noted patient received Solumedrol 125 mg on 12/19/14 at 19:27 which contributing to hyperglycemia. Please consider ordering Lantus 15 units Q24H (based on 105 kg x 0.15 units). Correction (SSI): Please increase Novolog correction to Resistant scale. HgbA1C: The last A1C in the chart was 11.5% on 08/27/14. Please consider ordering an A1C to evaluate glycemic control over the past 2-3 months.  Thanks, Barnie Alderman, RN, MSN, CCRN, CDE Diabetes Coordinator Inpatient Diabetes Program [phone] (Team Pager from [place] to [place] Gonzalez) [phone] ([facility] office) [phone] [facility] office) [phone] [facility] office)

## 2014-12-20 NOTE — Consult Note (Signed)
GI Inpatient Consult Note  Reason for Consult:  N/v, heme + stool.   Attending Requesting Consult: Hower  History of Present Illness:  Molly Hoover is a 69 y.o. female with a h/o multiple chronic medical issues admitted for intractable nausea and vomiting and heme + stool. Patient presented to her PCP c/o generalized abdominal pain x 2 weeks, nausea, NBNB emesis, and diarrhea x 3 days. Stool was found to be heme+, so she was sent to the [facility] ED. Upon arrival, Abd xray, CT angiogram abd/pelvis, labs, and C. diff quickscan were unremarkable. She was started on IV fluids, Zofran, IV Protonix 40mg  q 12 hrs, admitted for further evaluation and management.  Miss Simmer reports the abdominal pain, nausea and vomiting started on approximately August 9. As was in the setting of several days of heavy meloxicam use. She stopped the meloxicam and symptoms seem to get better temporarily. However her symptoms have now returned. Here in the hospital her nausea and vomiting have improved. Her diarrhea continues. She reports up to 10 stools liquidy, brown yesterday. Her was blood mixed into multiple stools yesterday. She denies any previous similar episodes   Last colonoscopy 2013 with just diverticulosis. Upper endoscopy December 2015 with dilation of a benign esophageal stricture.   Past Medical History:  Past Medical History  Diagnosis Date  . Hypertension   . Diabetes mellitus without complication   . Hiatal hernia   . GERD (gastroesophageal reflux disease)   . Hyperlipidemia   . Degenerative disc disease   . Anxiety   . Depression   . Allergic rhinitis   . COPD (chronic obstructive pulmonary disease)   . Diabetic peripheral neuropathy   . Hypothyroidism   . Vitamin D deficiency   . Bilateral breast cysts   . Asthma   . Diverticulosis   . PVD (peripheral vascular disease)   . Enlarged heart   . Syncope and collapse     Problem List: Patient Active Problem List   Diagnosis  Date Noted  . Intractable nausea and vomiting 12/19/2014  . Fecal occult blood test positive 12/19/2014  . COPD, moderate 11/19/2014  . Edema extremities 11/19/2014  . H/O gastrointestinal disease 11/19/2014  . Combined fat and carbohydrate induced hyperlipemia 11/19/2014  . Muscle spasms of head and/or neck 11/19/2014  . Idiopathic osteoporosis 11/19/2014  . 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 Diabetes mellitus type 2, uncontrolled 11/19/2014  . Venous insufficiency of leg 11/19/2014  . Morbid obesity 10/17/2013  . Essential hypertension 10/17/2013  . Chronic cough 10/17/2013  . Chest pain on exertion 10/17/2013    Past Surgical History: Past Surgical History  Procedure Laterality Date  . Cholecystectomy    . Total abdominal hysterectomy    . Cervical fusion  2002  . Lumbar fusion      L1-4   . Carpal tunnel release      right  . Hernia repair    . Breast biopsy    . Esophagogastroduodenoscopy  03/2014    benign stricture dilated    Allergies: Allergies  Allergen Reactions  . Codeine Hives and Itching  . Combivent [Ipratropium-Albuterol]     itching  . Ivp Dye [Iodinated Diagnostic Agents]     Home Medications: Prescriptions prior to admission  Medication Sig Dispense Refill Last Dose  . alendronate (FOSAMAX) 70 MG tablet Take 70 mg by mouth once a week. Take with a full glass of water on an empty stomach.   12/19/2014 at 0730  .  atorvastatin (LIPITOR) 20 MG tablet Take 1 tablet by mouth at bedtime.   12/19/2014 at 0730  . Cranberry 500 MG CAPS Take 1 capsule by mouth daily.    12/19/2014 at 0730  . gemfibrozil (LOPID) 600 MG tablet Take 1 tablet by mouth 2 (two) times daily.   12/19/2014 at 0730  . losartan (COZAAR) 25 MG tablet TAKE 1 TABLET DAILY 90 tablet 2 12/19/2014 at 0730  . metFORMIN (GLUCOPHAGE) 500 MG tablet Take 1 tablet by mouth 2 (two) times daily.   12/19/2014 at 0730  . mometasone-formoterol (DULERA) 100-5 MCG/ACT AERO Inhale 2 puffs into the lungs 2  (two) times daily.   12/19/2014 at Unknown time  . omega-3 acid ethyl esters (LOVAZA) 1 G capsule TAKE 1 CAPSULE TWICE A DAY 180 capsule 3 12/19/2014 at 0730  . omeprazole (PRILOSEC) 20 MG capsule Take 1 capsule by mouth daily.   12/19/2014 at 0730  . sitaGLIPtin (JANUVIA) 100 MG tablet Take 1 tablet by mouth daily.   12/18/2014 at Unknown time  . SPIRIVA HANDIHALER 18 MCG inhalation capsule INHALE THE CONTENTS OF 1 CAPSULE DAILY 90 capsule 2 12/19/2014 at 0730  . Vitamin D, Ergocalciferol, (DRISDOL) 50000 UNITS CAPS capsule Take 50,000 Units by mouth every 7 (seven) days.   12/19/2014 at [place] medication reconciliation was completed with the patient.   Scheduled Inpatient Medications:   . atorvastatin  20 mg Oral QHS  . insulin aspart  0-5 Units Subcutaneous QHS  . insulin aspart  0-9 Units Subcutaneous TID WC  . losartan  25 mg Oral Daily  . mometasone-formoterol  2 puff Inhalation BID  . pantoprazole (PROTONIX) IV  40 mg Intravenous Q12H  . tiotropium  1 capsule Inhalation Daily    Continuous Inpatient Infusions:   . sodium chloride 100 mL/hr at 12/20/14 0851    PRN Inpatient Medications:  acetaminophen **OR** acetaminophen, hydrALAZINE, morphine injection, ondansetron **OR** ondansetron (ZOFRAN) IV, oxyCODONE  Family History: family history includes Heart attack in her father; Heart attack (age of onset: 60) in her mother; Hypertension in her mother.  Social History:   reports that she has quit smoking. Her smoking use included Cigarettes. She quit after 15 years of use. She does not have any smokeless tobacco history on file. She reports that she drinks about 1.2 oz of alcohol per week. She reports that she does not use illicit drugs.  Review of Systems: Constitutional: +wt loss Eyes: No changes in vision. ENT: No oral lesions, sore throat.  GI: see HPI.  Heme/Lymph: No easy bruising.  CV: No chest pain.  GU: No hematuria.  Integumentary: No rashes.  Neuro: No  headaches.  Psych: No depression/anxiety.  Endocrine: No heat/cold intolerance.  Allergic/Immunologic: No urticaria.  Resp: No cough, SOB.  Musculoskeletal: No joint swelling.    Physical Examination: BP 164/85 mmHg  Pulse 89  Temp(Src) 98.2 F (36.8 C) (Oral)  Resp 17  Ht 5\' 2"  (1.575 m)  Wt 105.779 kg (233 lb 3.2 oz)  BMI 42.64 kg/m2  SpO2 95% Gen: NAD, alert and oriented x 4, +obese HEENT: PEERLA, EOMI, Neck: supple, no JVD or thyromegaly Chest: CTA bilaterally, no wheezes, crackles, or other adventitious sounds CV: RRR, no m/g/c/r Abd:+ mild diffuse TTP, no r/g, soft, +obese abd.  Ext: no edema, well perfused with 2+ pulses, Skin: no rash or lesions noted Lymph: no LAD  Data: Lab Results  Component Value Date   WBC 6.5 12/20/2014   HGB 12.5 12/20/2014  HCT 37.8 12/20/2014   MCV 88.0 12/20/2014   PLT 148* 12/20/2014    Recent Labs Lab 12/19/14 1642 12/20/14 0511  HGB 13.2 12.5   Lab Results  Component Value Date   NA 137 12/20/2014   K 4.7 12/20/2014   CL 105 12/20/2014   CO2 24 12/20/2014   BUN 13 12/20/2014   CREATININE 0.67 12/20/2014   Lab Results  Component Value Date   ALT 19 12/19/2014   AST 22 12/19/2014   ALKPHOS 55 12/19/2014   BILITOT 1.1 12/19/2014   No results for input(s): APTT, INR, PTT in the last 168 hours.   Assessment/Plan: Ms. Joerger is a 69 y.o. female with several weeks of nausea vomiting, abdominal pain, diarrhea. Her diarrhea was quite frequent yesterday and also contained some bright red blood. Nausea vomiting has gotten better here in the hospital. It appears that she was tested for study C. difficile but not ova and parasites her stool culture.  I would like to send her stool for stool culture and ova and parasites. If her symptoms continue and stool studies are negative, I would recommend a colonoscopy on Monday to investigate the diarrhea and rectal bleeding.  Would also recommend upper endoscopy to investigate the  nausea and vomiting and given the history of esophageal stricture  Thank you for the consult. Please call with questions or concerns.  REIN, Grace Blight, MD

## 2014-12-20 NOTE — Progress Notes (Signed)
[facility] [place] NAME: Molly Hoover    MR#:  [id number]  DATE OF BIRTH:  25-Jan-1946  SUBJECTIVE:  CHIEF COMPLAINT:   Chief Complaint  Patient presents with  . Abdominal Pain   [name] is a 69 year old female with past medical history significant for history of diabetes, hypertension, hiatal hernia, hyperlipidemia who presents to the hospital with complaints of abdominal pain, nausea, vomiting and diarrhea. She also noted bright red blood in stool.    Review of Systems  Constitutional: Negative for fever, chills and weight loss.  HENT: Negative for congestion.   Eyes: Negative for blurred vision and double vision.  Respiratory: Negative for cough, sputum production, shortness of breath and wheezing.   Cardiovascular: Negative for chest pain, palpitations, orthopnea, leg swelling and PND.  Gastrointestinal: Positive for nausea, abdominal pain and diarrhea. Negative for vomiting, constipation and blood in stool.  Genitourinary: Negative for dysuria, urgency, frequency and hematuria.  Musculoskeletal: Negative for falls.  Neurological: Negative for dizziness, tremors, focal weakness and headaches.  Endo/Heme/Allergies: Does not bruise/bleed easily.  Psychiatric/Behavioral: Negative for depression. The patient does not have insomnia.     VITAL SIGNS: Blood pressure 164/85, pulse 89, temperature 98.2 F (36.8 C), temperature source Oral, resp. rate 17, height 5\' 2"  (1.575 m), weight 105.779 kg (233 lb 3.2 oz), SpO2 95 %.  PHYSICAL EXAMINATION:   GENERAL:  69 y.o.-year-old patient lying in the bed with no acute distress.  EYES: Pupils equal, round, reactive to light and accommodation. No scleral icterus. Extraocular muscles intact.  HEENT: Head atraumatic, normocephalic. Oropharynx and nasopharynx clear.  NECK:  Supple, no jugular venous distention. No thyroid enlargement, no tenderness.  LUNGS: Normal breath sounds  bilaterally, no wheezing, rales,rhonchi or crepitation. No use of accessory muscles of respiration.  CARDIOVASCULAR: S1, S2 normal. No murmurs, rubs, or gallops.  ABDOMEN: Soft, diffusely tender, including in upper abdomen, no voluntary guarding or rebound , nondistended. Bowel sounds present. No organomegaly or mass.  EXTREMITIES: No pedal edema, cyanosis, or clubbing.  NEUROLOGIC: Cranial nerves II through XII are intact. Muscle strength 5/5 in all extremities. Sensation intact. Gait not checked.  PSYCHIATRIC: The patient is alert and oriented x 3.  SKIN: No obvious rash, lesion, or ulcer.   ORDERS/RESULTS REVIEWED:   CBC  Recent Labs Lab 12/19/14 1642 12/20/14 0511  WBC 9.3 6.5  HGB 13.2 12.5  HCT 40.3 37.8  PLT 163 148*  MCV 87.5 88.0  MCH 28.7 29.1  MCHC 32.8 33.1  RDW 14.1 14.3  LYMPHSABS 0.9*  --   MONOABS 0.5  --   EOSABS 0.1  --   BASOSABS 0.0  --    ------------------------------------------------------------------------------------------------------------------  Chemistries   Recent Labs Lab 12/19/14 1642 12/20/14 0511  NA 133* 137  K 4.3 4.7  CL 101 105  CO2 23 24  GLUCOSE 305* 346*  BUN 14 13  CREATININE 0.58 0.67  CALCIUM 9.4 8.4*  AST 22  --   ALT 19  --   ALKPHOS 55  --   BILITOT 1.1  --    ------------------------------------------------------------------------------------------------------------------ estimated creatinine clearance is 75.9 mL/min (by C-G formula based on Cr of 0.67). ------------------------------------------------------------------------------------------------------------------ No results for input(s): TSH, T4TOTAL, T3FREE, THYROIDAB in the last 72 hours.  Invalid input(s): FREET3  Cardiac Enzymes No results for input(s): CKMB, TROPONINI, MYOGLOBIN in the last 168 hours.  Invalid input(s): CK ------------------------------------------------------------------------------------------------------------------ Invalid  input(s): POCBNP ---------------------------------------------------------------------------------------------------------------  RADIOLOGY: Ct Angio Abdomen W/cm &/or Wo  Contrast  12/19/2014   CLINICAL DATA:  Generalized abdominal pain, vomiting, and diarrhea. Recurrent problem with sudden onset. Colicky pain.  EXAM: CT ANGIOGRAPHY ABDOMEN  TECHNIQUE: Multidetector CT imaging of the abdomen was performed using the standard protocol during bolus administration of intravenous contrast. Multiplanar reconstructed images including MIPs were obtained and reviewed to evaluate the vascular anatomy.  CONTRAST:  174mL OMNIPAQUE IOHEXOL 350 MG/ML SOLN  COMPARISON:  None.  FINDINGS: Slight fibrosis in the lung bases.  Normal caliber abdominal aorta. Diffuse calcific and noncalcific atherosclerotic changes. No aortic dissection or aneurysm. The abdominal aorta, celiac axis, superior mesenteric artery, single right and duplicated left renal arteries, inferior mesenteric artery, and bilateral iliac, external iliac, internal iliac, and common femoral arteries are patent. No critical stenosis is demonstrated. Hepatic veins, portal veins, splenic veins, and superior mesenteric veins are patent. Renal nephrograms are symmetrical.  Surgical absence of the gallbladder. The liver, spleen, pancreas, adrenal glands, inferior vena cava, and retroperitoneal lymph nodes are unremarkable. Sub cm low-attenuation lesions in the kidneys likely represent small cysts. No hydronephrosis. Stomach, small bowel, and colon are not abnormally distended. No free air or free fluid in the abdomen. Small umbilical hernia containing fat.  Pelvis: The appendix is normal. Diverticulosis in the sigmoid colon without evidence of diverticulitis. Bladder is decompressed. Uterus is surgically absent. No free or loculated pelvic fluid collections. No pelvic mass or lymphadenopathy. Postoperative changes with posterior laminectomies and posterior rod and screw  fixation from L4 to the sacrum. Degenerative changes throughout the lumbar spine. No destructive bone lesions demonstrated.  Review of the MIP images confirms the above findings.  IMPRESSION: Atherosclerotic changes in the abdominal aorta. No evidence of aneurysm, dissection, or occlusion of the abdominal aorta, branch vessels, or portal venous system.   Electronically Signed   By: Lucienne Capers M.D.   On: 12/19/2014 21:03   Dg Abd Acute W/chest  12/19/2014   CLINICAL DATA:  Intermittent abdominal pain since August 9. Diarrhea with medications. Sent for further evaluation by primary physician. Generalized abdominal cramping.  EXAM: DG ABDOMEN ACUTE W/ 1V CHEST  COMPARISON:  Chest 06/19/2014  FINDINGS: Cardiac enlargement with borderline pulmonary vascularity. No significant edema or consolidation. No blunting of costophrenic angles. No pneumothorax. Degenerative changes in the shoulders. Postoperative change in the cervical spine.  Scattered gas and stool throughout the colon. No small or large bowel distention. No free intra-abdominal air. No abnormal air-fluid levels. Surgical clips in the right upper quadrant. Postoperative changes in the lower lumbar spine. No radiopaque stones.  IMPRESSION: Cardiac enlargement. No evidence of edema or consolidation. Normal nonobstructive bowel gas pattern.   Electronically Signed   By: Lucienne Capers M.D.   On: 12/19/2014 18:05    EKG:  Orders placed or performed in visit on 06/19/14  . EKG 12-Lead    ASSESSMENT AND PLAN:  Principal Problem:   Intractable nausea and vomiting Active Problems:   Fecal occult blood test positive 1. Rectal bleed, get stool cultures, for ova and parasites including, get gastroenterologist involved for further recommendations, rule out ischemic colitis, although CT scan of the abdomen did not show any significant abnormalities 2. Nausea and vomiting, she is reluctant to advance diet to full liquid diet. We will be continuing  clear liquid diet. Add ensure. Patient's diabetes is poorly controlled and I'm concerned that patient's nausea, vomiting could be related to gastroparesis. 3. Diarrhea as above, getting stool cultures to rule out infectious etiology, C. difficile toxin and antigen were negative 4.  Diabetes mellitus, poorly controlled with hemoglobin level X.0, get diabetic education, initiate patient on insulin therapy, I think scale insulin   Management plans discussed with the patient, family and they are in agreement.   DRUG ALLERGIES:  Allergies  Allergen Reactions  . Codeine Hives and Itching  . Combivent [Ipratropium-Albuterol]     itching  . Ivp Dye [Iodinated Diagnostic Agents]     CODE STATUS:     Code Status Orders        Start     Ordered   12/19/14 2216  Full code   Continuous     12/19/14 2216      TOTAL TIME TAKING CARE OF THIS PATIENT: 45  minutes.  Discussed with gastroenterologist, Dr. Wallace Keller M.D on 12/20/2014 at 2:59 PM  Between 7am to 6pm - Pager - [phone]  After 6pm go to [email or web address] - password [email or web address] [facility]  Office  [phone]  CC: Primary care physician; Halina Maidens, MD

## 2014-12-21 LAB — GLUCOSE, CAPILLARY
GLUCOSE-CAPILLARY: 226 mg/dL — AB (ref 65–99)
GLUCOSE-CAPILLARY: 218 mg/dL — AB (ref 65–99)
GLUCOSE-CAPILLARY: 258 mg/dL — AB (ref 65–99)
Glucose-Capillary: 212 mg/dL — ABNORMAL HIGH (ref 65–99)

## 2014-12-21 LAB — HEMOGLOBIN: HEMOGLOBIN: 11.4 g/dL — AB (ref 12.0–16.0)

## 2014-12-21 MED ORDER — INSULIN DETEMIR 100 UNIT/ML ~~LOC~~ SOLN
18.0000 [IU] | Freq: Every day | SUBCUTANEOUS | Status: DC
  Administered 2014-12-22: 18 [IU] via SUBCUTANEOUS
  Filled 2014-12-21 (×2): qty 0.18

## 2014-12-21 NOTE — Progress Notes (Signed)
Initial Nutrition Assessment      INTERVENTION:  Meals and snacks: Cater to pt preferences. Would recommend diet progression to soft diet once ready to take solid foods Nutrition Supplement Therapy: Encouraged intake of glucerna for added nutrition  NUTRITION DIAGNOSIS:   Inadequate oral intake related to altered GI function as evidenced by per patient/family report.    GOAL:   Patient will meet greater than or equal to 90% of their needs    MONITOR:    (Energy intake, Digestive system)  REASON FOR ASSESSMENT:   Consult Assessment of nutrition requirement/status  ASSESSMENT:      Pt admitted with abdominal pain, nausea, vomiting diarrhea, bright red blood per rectum  Past Medical History  Diagnosis Date  . Hypertension   . Diabetes mellitus without complication   . Hiatal hernia   . GERD (gastroesophageal reflux disease)   . Hyperlipidemia   . Degenerative disc disease   . Anxiety   . Depression   . Allergic rhinitis   . COPD (chronic obstructive pulmonary disease)   . Diabetic peripheral neuropathy   . Hypothyroidism   . Vitamin D deficiency   . Bilateral breast cysts   . Asthma   . Diverticulosis   . PVD (peripheral vascular disease)   . Enlarged heart   . Syncope and collapse     Current Nutrition:  Tolerating liquids  Food/Nutrition-Related History: pt reports decreased intake for 2 days prior to admission secondary to nausea, vomiting, diarrhea. Otherwise healthy appetite   Medications: NS at 154ml/hr, aspart, determir  Electrolyte/Renal Profile and Glucose Profile:   Recent Labs Lab 12/19/14 1642 12/20/14 0511  NA 133* 137  K 4.3 4.7  CL 101 105  CO2 23 24  BUN 14 13  CREATININE 0.58 0.67  CALCIUM 9.4 8.4*  GLUCOSE 305* 346*   Protein Profile:  Recent Labs Lab 12/19/14 1642  ALBUMIN 4.4    Gastrointestinal Profile: Last BM: 8/26   Nutrition-Focused Physical Exam Findings: Nutrition-Focused physical exam completed.  Findings are WDL for fat depletion, muscle depletion, and edema.     Weight Change: stable wt    Diet Order:  Diet clear liquid Room service appropriate?: Yes; Fluid consistency:: Thin  Skin:   reviewed   Height:   Ht Readings from Last 1 Encounters:  12/19/14 5\' 2"  (1.575 m)    Weight:   Wt Readings from Last 1 Encounters:  12/19/14 233 lb 3.2 oz (105.779 kg)    BMI:  Body mass index is 42.64 kg/(m^2).   EDUCATION NEEDS:   No education needs identified at this time  LOW Care Level  Molly Hoover B. Zenia Resides, [place], [place] (pager)

## 2014-12-21 NOTE — Progress Notes (Signed)
[facility] [place] NAME: Molly Hoover    MR#:  [id number]  DATE OF BIRTH:  [birth year]  SUBJECTIVE:  CHIEF COMPLAINT:   Chief Complaint  Patient presents with  . Abdominal Pain   Mrs. Rolfe is a 69 year old female with past medical history significant for history of diabetes, hypertension, hiatal hernia, hyperlipidemia who presents to the hospital with complaints of abdominal pain, nausea, vomiting and diarrhea. She also noted bright red blood in stool. Seen today, patient has no abdominal pain, diarrhea. Does have some nausea but better than before.  Review of Systems  Constitutional: Negative for fever, chills and weight loss.  HENT: Negative for congestion.   Eyes: Negative for blurred vision and double vision.  Respiratory: Negative for cough, sputum production, shortness of breath and wheezing.   Cardiovascular: Negative for chest pain, palpitations, orthopnea, leg swelling and PND.  Gastrointestinal: Positive for nausea. Negative for vomiting, abdominal pain, diarrhea, constipation and blood in stool.  Genitourinary: Negative for dysuria, urgency, frequency and hematuria.  Musculoskeletal: Negative for falls.  Neurological: Negative for dizziness, tremors, focal weakness and headaches.  Endo/Heme/Allergies: Does not bruise/bleed easily.  Psychiatric/Behavioral: Negative for depression. The patient does not have insomnia.     VITAL SIGNS: Blood pressure 152/76, pulse 68, temperature 97.8 F (36.6 C), temperature source Oral, resp. rate 18, height 5\' 2"  (1.575 m), weight 105.779 kg (233 lb 3.2 oz), SpO2 95 %.  PHYSICAL EXAMINATION:   GENERAL:  69 y.o.-year-old patient lying in the bed with no acute distress.  EYES: Pupils equal, round, reactive to light and accommodation. No scleral icterus. Extraocular muscles intact.  HEENT: Head atraumatic, normocephalic. Oropharynx and nasopharynx clear.  NECK:  Supple, no jugular  venous distention. No thyroid enlargement, no tenderness.  LUNGS: Normal breath sounds bilaterally, no wheezing, rales,rhonchi or crepitation. No use of accessory muscles of respiration.  CARDIOVASCULAR: S1, S2 normal. No murmurs, rubs, or gallops.  ABDOMEN: Soft, diffusely tender, including in upper abdomen, no voluntary guarding or rebound , nondistended. Bowel sounds present. No organomegaly or mass.  EXTREMITIES: No pedal edema, cyanosis, or clubbing.  NEUROLOGIC: Cranial nerves II through XII are intact. Muscle strength 5/5 in all extremities. Sensation intact. Gait not checked.  PSYCHIATRIC: The patient is alert and oriented x 3.  SKIN: No obvious rash, lesion, or ulcer.   ORDERS/RESULTS REVIEWED:   CBC  Recent Labs Lab 12/19/14 1642 12/20/14 0511 12/21/14 0604  WBC 9.3 6.5  --   HGB 13.2 12.5 11.4*  HCT 40.3 37.8  --   PLT 163 148*  --   MCV 87.5 88.0  --   MCH 28.7 29.1  --   MCHC 32.8 33.1  --   RDW 14.1 14.3  --   LYMPHSABS 0.9*  --   --   MONOABS 0.5  --   --   EOSABS 0.1  --   --   BASOSABS 0.0  --   --    ------------------------------------------------------------------------------------------------------------------  Chemistries   Recent Labs Lab 12/19/14 1642 12/20/14 0511  NA 133* 137  K 4.3 4.7  CL 101 105  CO2 23 24  GLUCOSE 305* 346*  BUN 14 13  CREATININE 0.58 0.67  CALCIUM 9.4 8.4*  AST 22  --   ALT 19  --   ALKPHOS 55  --   BILITOT 1.1  --    ------------------------------------------------------------------------------------------------------------------ estimated creatinine clearance is 75.9 mL/min (by C-G formula based on Cr of 0.67). ------------------------------------------------------------------------------------------------------------------ No  results for input(s): TSH, T4TOTAL, T3FREE, THYROIDAB in the last 72 hours.  Invalid input(s): FREET3  Cardiac Enzymes No results for input(s): CKMB, TROPONINI, MYOGLOBIN in the last  168 hours.  Invalid input(s): CK ------------------------------------------------------------------------------------------------------------------ Invalid input(s): POCBNP ---------------------------------------------------------------------------------------------------------------  RADIOLOGY: Ct Angio Abdomen W/cm &/or Wo Contrast  12/19/2014   CLINICAL DATA:  Generalized abdominal pain, vomiting, and diarrhea. Recurrent problem with sudden onset. Colicky pain.  EXAM: CT ANGIOGRAPHY ABDOMEN  TECHNIQUE: Multidetector CT imaging of the abdomen was performed using the standard protocol during bolus administration of intravenous contrast. Multiplanar reconstructed images including MIPs were obtained and reviewed to evaluate the vascular anatomy.  CONTRAST:  151mL OMNIPAQUE IOHEXOL 350 MG/ML SOLN  COMPARISON:  None.  FINDINGS: Slight fibrosis in the lung bases.  Normal caliber abdominal aorta. Diffuse calcific and noncalcific atherosclerotic changes. No aortic dissection or aneurysm. The abdominal aorta, celiac axis, superior mesenteric artery, single right and duplicated left renal arteries, inferior mesenteric artery, and bilateral iliac, external iliac, internal iliac, and common femoral arteries are patent. No critical stenosis is demonstrated. Hepatic veins, portal veins, splenic veins, and superior mesenteric veins are patent. Renal nephrograms are symmetrical.  Surgical absence of the gallbladder. The liver, spleen, pancreas, adrenal glands, inferior vena cava, and retroperitoneal lymph nodes are unremarkable. Sub cm low-attenuation lesions in the kidneys likely represent small cysts. No hydronephrosis. Stomach, small bowel, and colon are not abnormally distended. No free air or free fluid in the abdomen. Small umbilical hernia containing fat.  Pelvis: The appendix is normal. Diverticulosis in the sigmoid colon without evidence of diverticulitis. Bladder is decompressed. Uterus is surgically absent. No  free or loculated pelvic fluid collections. No pelvic mass or lymphadenopathy. Postoperative changes with posterior laminectomies and posterior rod and screw fixation from L4 to the sacrum. Degenerative changes throughout the lumbar spine. No destructive bone lesions demonstrated.  Review of the MIP images confirms the above findings.  IMPRESSION: Atherosclerotic changes in the abdominal aorta. No evidence of aneurysm, dissection, or occlusion of the abdominal aorta, branch vessels, or portal venous system.   Electronically Signed   By: Lucienne Capers M.D.   On: 12/19/2014 21:03   Dg Abd Acute W/chest  12/19/2014   CLINICAL DATA:  Intermittent abdominal pain since August 9. Diarrhea with medications. Sent for further evaluation by primary physician. Generalized abdominal cramping.  EXAM: DG ABDOMEN ACUTE W/ 1V CHEST  COMPARISON:  Chest 06/19/2014  FINDINGS: Cardiac enlargement with borderline pulmonary vascularity. No significant edema or consolidation. No blunting of costophrenic angles. No pneumothorax. Degenerative changes in the shoulders. Postoperative change in the cervical spine.  Scattered gas and stool throughout the colon. No small or large bowel distention. No free intra-abdominal air. No abnormal air-fluid levels. Surgical clips in the right upper quadrant. Postoperative changes in the lower lumbar spine. No radiopaque stones.  IMPRESSION: Cardiac enlargement. No evidence of edema or consolidation. Normal nonobstructive bowel gas pattern.   Electronically Signed   By: Lucienne Capers M.D.   On: 12/19/2014 18:05    EKG:  Orders placed or performed in visit on 06/19/14  . EKG 12-Lead    ASSESSMENT AND PLAN:  Principal Problem:   Intractable nausea and vomiting Active Problems:   Fecal occult blood test positive 1. Rectal bleed, resolved likely secondary to hemorrhoids. Patient to stool cultures negative for C. difficile but waiting for the 01 parasites. Patient diarrhea and vomiting  symptoms are consistent with viral gastroenteritis. 2. Nausea and vomiting, ;  continue Reglan for nausea, advance the  diet to soft diet, , patient is willing to try today. 3. Diarrhea as above, getting stool cultures to rule out infectious etiology, C. difficile toxin and antigen were negative by resolving. Seen by GI patient may need upper endoscopy for evaluation of diabetic gastroparesis. Patient may also need motility studies. May not need colonoscopy  As her diarrhea resolved. 4. Diabetes mellitus, poorly controlled with hemoglobin level X.0, continue NovoLog,Mwith meal time, increase the Levemir to 18 units daily , restart  Metformin. 5 possible  EGD on Monday if patient continues to have nausea, appreciate GI input.  DRUG ALLERGIES:  Allergies  Allergen Reactions  . Codeine Hives and Itching  . Combivent [Ipratropium-Albuterol]     itching  . Ivp Dye [Iodinated Diagnostic Agents]     CODE STATUS:     Code Status Orders        Start     Ordered   12/19/14 2216  Full code   Continuous     12/19/14 2216      TOTAL TIME TAKING CARE OF THIS PATIENT: 30  minutes.    Epifanio Lesches M.D on 12/21/2014 at 11:02 AM  Between 7am to 6pm - Pager - [phone]  After 6pm go to [email or web address] - password [email or web address] [facility]  Office  [phone]  CC: Primary care physician; Halina Maidens, MD

## 2014-12-21 NOTE — Progress Notes (Signed)
GI Inpatient Follow-up Note  Patient Identification: Molly Hoover is a 69 y.o. female witth n/v, diarrhea, rectal bleeding.   Subjective:  Feels better today. No more diarrhea. No more blood in stool.  Still some abd pain.  N/v also improved.   Scheduled Inpatient Medications:  . atorvastatin  20 mg Oral QHS  . feeding supplement (GLUCERNA SHAKE)  237 mL Oral TID BM  . insulin aspart  0-5 Units Subcutaneous QHS  . insulin aspart  0-9 Units Subcutaneous TID WC  . [START ON 12/22/2014] insulin detemir  18 Units Subcutaneous Daily  . losartan  25 mg Oral Daily  . mometasone-formoterol  2 puff Inhalation BID  . pantoprazole (PROTONIX) IV  40 mg Intravenous Q12H  . tiotropium  1 capsule Inhalation Daily    Continuous Inpatient Infusions:     PRN Inpatient Medications:  acetaminophen **OR** acetaminophen, hydrALAZINE, morphine injection, ondansetron **OR** ondansetron (ZOFRAN) IV, oxyCODONE    Physical Examination: BP 133/61 mmHg  Pulse 64  Temp(Src) 98.5 F (36.9 C) (Oral)  Resp 18  Ht 5\' 2"  (1.575 m)  Wt 105.779 kg (233 lb 3.2 oz)  BMI 42.64 kg/m2  SpO2 100% Gen: NAD, alert and oriented x 4  Neck: supple, no JVD or thyromegaly Chest: CTA bilaterally, no wheezes, crackles, or other adventitious sounds CV: RRR, no m/g/c/r Abd: + diffuse TTP, nabs, no r/g.  Ext: no edema, well perfused with 2+ pulses, Skin: no rash or lesions noted Lymph: no LAD  Data: Lab Results  Component Value Date   WBC 6.5 12/20/2014   HGB 11.4* 12/21/2014   HCT 37.8 12/20/2014   MCV 88.0 12/20/2014   PLT 148* 12/20/2014    Recent Labs Lab 12/19/14 1642 12/20/14 0511 12/21/14 0604  HGB 13.2 12.5 11.4*   Lab Results  Component Value Date   NA 137 12/20/2014   K 4.7 12/20/2014   CL 105 12/20/2014   CO2 24 12/20/2014   BUN 13 12/20/2014   CREATININE 0.67 12/20/2014   Lab Results  Component Value Date   ALT 19 12/19/2014   AST 22 12/19/2014   ALKPHOS 55 12/19/2014   BILITOT 1.1 12/19/2014   No results for input(s): APTT, INR, PTT in the last 168 hours.   Assessment/Plan: Ms. Kassa is a 69 y.o. female with n/v, diarrhea with trace bleeding.   Diarrhea and bleeding have now resovled, clinically much improved today.   Recommendations: - will cancel plans for colonoscopy and EGD on MOnday since diarrhea has resolved and symptoms improving. - cont PPI - GI clinic f/u , safe for d/c if symtpoms continue to imrpove given CT and labs are unremarkable.   Please call with questions or concerns.  Ieasha Boerema, Grace Blight, MD

## 2014-12-22 LAB — GLUCOSE, CAPILLARY
GLUCOSE-CAPILLARY: 206 mg/dL — AB (ref 65–99)
GLUCOSE-CAPILLARY: 259 mg/dL — AB (ref 65–99)
GLUCOSE-CAPILLARY: 217 mg/dL — AB (ref 65–99)
Glucose-Capillary: 225 mg/dL — ABNORMAL HIGH (ref 65–99)

## 2014-12-22 MED ORDER — METOCLOPRAMIDE HCL 5 MG PO TABS
5.0000 mg | ORAL_TABLET | Freq: Three times a day (TID) | ORAL | Status: DC
  Administered 2014-12-22 – 2014-12-23 (×4): 5 mg via ORAL
  Filled 2014-12-22 (×4): qty 1

## 2014-12-22 MED ORDER — INSULIN DETEMIR 100 UNIT/ML ~~LOC~~ SOLN
22.0000 [IU] | Freq: Every day | SUBCUTANEOUS | Status: DC
  Administered 2014-12-23: 22 [IU] via SUBCUTANEOUS
  Filled 2014-12-22 (×2): qty 0.22

## 2014-12-22 NOTE — Progress Notes (Signed)
[facility] [place] NAME: Molly Hoover    MR#:  [id number]  DATE OF BIRTH:  [birth year]  SUBJECTIVE:  CHIEF COMPLAINT:   Chief Complaint  Patient presents with  . Abdominal Pain   Molly Hoover is a 69 year old female with past medical history significant for history of diabetes, hypertension, hiatal hernia, hyperlipidemia who presents to the hospital with complaints of abdominal pain, nausea, vomiting and diarrhea. She also noted bright red blood in stool. He has some nausea. Abdominal pain, diarrhea. Tolerating the regular diet.   Review of Systems  Constitutional: Negative for fever, chills and weight loss.  HENT: Negative for congestion.   Eyes: Negative for blurred vision and double vision.  Respiratory: Negative for cough, sputum production, shortness of breath and wheezing.   Cardiovascular: Negative for chest pain, palpitations, orthopnea, leg swelling and PND.  Gastrointestinal: Positive for nausea. Negative for vomiting, abdominal pain, diarrhea, constipation and blood in stool.  Genitourinary: Negative for dysuria, urgency, frequency and hematuria.  Musculoskeletal: Negative for falls.  Neurological: Negative for dizziness, tremors, focal weakness and headaches.  Endo/Heme/Allergies: Does not bruise/bleed easily.  Psychiatric/Behavioral: Negative for depression. The patient does not have insomnia.     VITAL SIGNS: Blood pressure 144/65, pulse 69, temperature 97.7 F (36.5 C), temperature source Oral, resp. rate 17, height 5\' 2"  (1.575 m), weight 105.779 kg (233 lb 3.2 oz), SpO2 96 %.  PHYSICAL EXAMINATION:   GENERAL:  69 y.o.-year-old patient lying in the bed with no acute distress.  EYES: Pupils equal, round, reactive to light and accommodation. No scleral icterus. Extraocular muscles intact.  HEENT: Head atraumatic, normocephalic. Oropharynx and nasopharynx clear.  NECK:  Supple, no jugular venous distention. No  thyroid enlargement, no tenderness.  LUNGS: Normal breath sounds bilaterally, no wheezing, rales,rhonchi or crepitation. No use of accessory muscles of respiration.  CARDIOVASCULAR: S1, S2 normal. No murmurs, rubs, or gallops.  ABDOMEN: Soft, diffusely tender, including in upper abdomen, no voluntary guarding or rebound , nondistended. Bowel sounds present. No organomegaly or mass.  EXTREMITIES: No pedal edema, cyanosis, or clubbing.  NEUROLOGIC: Cranial nerves II through XII are intact. Muscle strength 5/5 in all extremities. Sensation intact. Gait not checked.  PSYCHIATRIC: The patient is alert and oriented x 3.  SKIN: No obvious rash, lesion, or ulcer.   ORDERS/RESULTS REVIEWED:   CBC  Recent Labs Lab 12/19/14 1642 12/20/14 0511 12/21/14 0604  WBC 9.3 6.5  --   HGB 13.2 12.5 11.4*  HCT 40.3 37.8  --   PLT 163 148*  --   MCV 87.5 88.0  --   MCH 28.7 29.1  --   MCHC 32.8 33.1  --   RDW 14.1 14.3  --   LYMPHSABS 0.9*  --   --   MONOABS 0.5  --   --   EOSABS 0.1  --   --   BASOSABS 0.0  --   --    ------------------------------------------------------------------------------------------------------------------  Chemistries   Recent Labs Lab 12/19/14 1642 12/20/14 0511  NA 133* 137  K 4.3 4.7  CL 101 105  CO2 23 24  GLUCOSE 305* 346*  BUN 14 13  CREATININE 0.58 0.67  CALCIUM 9.4 8.4*  AST 22  --   ALT 19  --   ALKPHOS 55  --   BILITOT 1.1  --    ------------------------------------------------------------------------------------------------------------------ estimated creatinine clearance is 75.9 mL/min (by C-G formula based on Cr of 0.67). ------------------------------------------------------------------------------------------------------------------ No results for input(s): TSH,  T4TOTAL, T3FREE, THYROIDAB in the last 72 hours.  Invalid input(s): FREET3  Cardiac Enzymes No results for input(s): CKMB, TROPONINI, MYOGLOBIN in the last 168 hours.  Invalid  input(s): CK ------------------------------------------------------------------------------------------------------------------ Invalid input(s): POCBNP ---------------------------------------------------------------------------------------------------------------  RADIOLOGY: No results found.  EKG:  Orders placed or performed in visit on 06/19/14  . EKG 12-Lead    ASSESSMENT AND PLAN:  Principal Problem:   Intractable nausea and vomiting Active Problems:   Fecal occult blood test positive 1. Rectal bleed, resolved likely secondary to hemorrhoids. Patient to stool cultures negative for C. difficile ,ova &parasites. Patient diarrhea and vomiting symptoms are consistent with viral gastroenteritis. 2. Nausea and vomiting, ;  continue Reglan for nausea, advancedthe diet to soft diet, still has some nasuea, 3. Diarrhea as above,Seen by GI patient may need upper endoscopy for evaluation of diabetic gastroparesis. Patient may also need motility studies. May not need colonoscopy  As her diarrhea resolved. 4. Diabetes mellitus, poorly controlled with hemoglobin level X.0, continue NovoLog,Mwith meal time, increased the Levemir to 20 units daily , restarted  Metformin. 5  likley d/c tomorrow DRUG ALLERGIES:  Allergies  Allergen Reactions  . Codeine Hives and Itching  . Combivent [Ipratropium-Albuterol]     itching  . Ivp Dye [Iodinated Diagnostic Agents]     CODE STATUS:     Code Status Orders        Start     Ordered   12/19/14 2216  Full code   Continuous     12/19/14 2216      TOTAL TIME TAKING CARE OF THIS PATIENT: 30  minutes.    Epifanio Lesches M.D on 12/22/2014 at 11:54 AM  Between 7am to 6pm - Pager - [phone]  After 6pm go to [email or web address] - password [email or web address] [facility]  Office  [phone]  CC: Primary care physician; Molly Maidens, MD

## 2014-12-23 LAB — GLUCOSE, CAPILLARY
GLUCOSE-CAPILLARY: 216 mg/dL — AB (ref 65–99)
GLUCOSE-CAPILLARY: 274 mg/dL — AB (ref 65–99)

## 2014-12-23 MED ORDER — INSULIN STARTER KIT- SYRINGES (ENGLISH)
1.0000 | Freq: Once | Status: AC
  Administered 2014-12-23: 1
  Filled 2014-12-23: qty 1

## 2014-12-23 MED ORDER — LIVING WELL WITH DIABETES BOOK
Freq: Once | Status: AC
  Administered 2014-12-23: 13:00:00
  Filled 2014-12-23: qty 1

## 2014-12-23 MED ORDER — INSULIN DETEMIR 100 UNIT/ML ~~LOC~~ SOLN: 22.0000 [IU] | Freq: Every day | SUBCUTANEOUS | Status: DC

## 2014-12-23 MED ORDER — METOCLOPRAMIDE HCL 5 MG PO TABS: 5.0000 mg | ORAL_TABLET | Freq: Three times a day (TID) | ORAL | Status: DC

## 2014-12-23 MED ORDER — GLUCERNA SHAKE PO LIQD: 237.0000 mL | Freq: Three times a day (TID) | ORAL | Status: DC

## 2014-12-23 MED ORDER — INSULIN ASPART 100 UNIT/ML ~~LOC~~ SOLN: 0.0000 [IU] | Freq: Three times a day (TID) | SUBCUTANEOUS | Status: DC

## 2014-12-23 NOTE — Progress Notes (Addendum)
Patient oxygen saturation drops to 83% on room air with exertion. Dr Vianne Bulls made aware. Patient ambulated with Oxygen and ranged between 86%- 96%. Patient recovers well at rest.

## 2014-12-23 NOTE — Progress Notes (Signed)
Pt. Demonstrated appropriate technique for self administering levemir and novolog insulin. Pt. Demonstrated twice, self administering one of each type to help control blood sugar. Pt. Also received handout diabetes education material as well as education from the nurse.

## 2014-12-23 NOTE — Care Management Note (Signed)
Case Management Note  Patient Details  Name: Molly Hoover MRN: [id number] Date of Birth: 03/10/1946  Subjective/Objective:                    Patient ambulated with nurse 3 laps around the nursing station. O2 saturation dropped to 83% on RA and then recovered to 96% when nurse placed patient on O2 at 2LPM.  Home O2 will be setup with [facility] prior to discharge. No other CM needs identified.   Action/Plan:   Expected Discharge Date:                  Expected Discharge Plan:  Home/Self Care  In-House Referral:     Discharge planning Services  CM Consult  Post Acute Care Choice:  Durable Medical Equipment Choice offered to:  Patient  DME Arranged:  Oxygen DME Agency:  [place]:    [place] Agency:     Status of Service:     Medicare Important Message Given:    Date Medicare IM Given:    Medicare IM give by:    Date Additional Medicare IM Given:    Additional Medicare Important Message give by:     If discussed at [place] of Stay Meetings, dates discussed:    Additional Comments:  Alvie Heidelberg, RN 12/23/2014, 10:36 AM

## 2014-12-23 NOTE — Discharge Instructions (Signed)

## 2014-12-23 NOTE — Care Management (Signed)
Received call from diabetes coordinator. Patient will need teaching for new insulin orders. Dr Vianne Bulls contacted and will place order for home health nurse to help with initial insulin teaching. Spoke with the nurse for the patient Molly Hoover who will reinforce teaching. Referral placed with Floydene Flock at [facility].

## 2014-12-23 NOTE — Progress Notes (Signed)
Status: Intractable nausea and vomiting/female 26 Family: husband bedside  Visit Assessment: The chaplain visited with her husband (has a breathing machine) and he said that he's about to get the wheelchair so that his wife can be discharged. The chaplain introduced pastoral care and gave encouraging words.  Pastoral Care: [phone] pager or by online request

## 2014-12-23 NOTE — Progress Notes (Signed)
SATURATION QUALIFICATIONS: (This note is used to comply with regulatory documentation for home oxygen)  Patient Saturations on Room Air at Rest = 98%  Patient Saturations on Room Air while Ambulating = 83%  Patient Saturations on 2 Liters of oxygen while Ambulating = 96%  Please briefly explain why patient needs home oxygen: COPD

## 2014-12-23 NOTE — Progress Notes (Signed)
Spoke with patient regarding d/c orders which includes Levemir insulin.  RN getting ready to assist patient with insulin administration and will allow her to administer.  Briefly reviewed Type 2 diabetes and the reasons she needs insulin including her elevated A1C. Reviewed the profile of Levemir insulin with with patient also. She states that she goes to the [employer] center daily for exercise, games and lunch.  Reviewed hypoglycemia signs and symptoms and proper treatment.  She verbalized understanding and states "they always have orange juice and crackers with peanut butter".  Discussed rotation of sites and administration sites.  Ordered insulin starter kit and Living Well with diabetes booklet.  Asked RN to please allow patient to practice drawing up insulin using saline bottle.    Home health has been ordered for patient regarding insulin administration/diabetes. Also stressed to patient and husband the importance of follow-up with PCP.  Will follow.  Thanks, Adah Perl, RN, BC-ADM Inpatient Diabetes Coordinator Pager [phone] ([phone])

## 2014-12-24 ENCOUNTER — Encounter: Payer: Self-pay | Admitting: Emergency Medicine

## 2014-12-24 ENCOUNTER — Other Ambulatory Visit: Payer: Self-pay | Admitting: Internal Medicine

## 2014-12-24 ENCOUNTER — Emergency Department
Admission: EM | Admit: 2014-12-24 | Discharge: 2014-12-24 | Disposition: A | Discharge status: Home / Self Care | Payer: Medicare Other | Attending: Emergency Medicine | Admitting: Emergency Medicine

## 2014-12-24 DIAGNOSIS — Z79811 Long term (current) use of aromatase inhibitors: Secondary | ICD-10-CM | POA: Insufficient documentation

## 2014-12-24 DIAGNOSIS — Z794 Long term (current) use of insulin: Secondary | ICD-10-CM | POA: Insufficient documentation

## 2014-12-24 DIAGNOSIS — Z79899 Other long term (current) drug therapy: Secondary | ICD-10-CM | POA: Diagnosis not present

## 2014-12-24 DIAGNOSIS — E1140 Type 2 diabetes mellitus with diabetic neuropathy, unspecified: Secondary | ICD-10-CM | POA: Insufficient documentation

## 2014-12-24 DIAGNOSIS — I10 Essential (primary) hypertension: Secondary | ICD-10-CM | POA: Diagnosis not present

## 2014-12-24 DIAGNOSIS — Z7951 Long term (current) use of inhaled steroids: Secondary | ICD-10-CM | POA: Diagnosis not present

## 2014-12-24 DIAGNOSIS — R197 Diarrhea, unspecified: Secondary | ICD-10-CM | POA: Diagnosis present

## 2014-12-24 DIAGNOSIS — Z87891 Personal history of nicotine dependence: Secondary | ICD-10-CM | POA: Insufficient documentation

## 2014-12-24 LAB — COMPREHENSIVE METABOLIC PANEL
ALK PHOS: 55 U/L (ref 38–126)
ALT: 19 U/L (ref 14–54)
AST: 19 U/L (ref 15–41)
Albumin: 4.1 g/dL (ref 3.5–5.0)
Anion gap: 9 (ref 5–15)
BUN: 9 mg/dL (ref 6–20)
CALCIUM: 9.4 mg/dL (ref 8.9–10.3)
CHLORIDE: 99 mmol/L — AB (ref 101–111)
CO2: 29 mmol/L (ref 22–32)
CREATININE: 0.66 mg/dL (ref 0.44–1.00)
GFR calc Af Amer: >60 mL/min (ref >60)
GFR calc non Af Amer: >60 mL/min (ref >60)
GLUCOSE: 241 mg/dL — AB (ref 65–99)
Potassium: 4.1 mmol/L (ref 3.5–5.1)
SODIUM: 137 mmol/L (ref 135–145)
Total Bilirubin: 1.1 mg/dL (ref 0.3–1.2)
Total Protein: 6.9 g/dL (ref 6.5–8.1)

## 2014-12-24 LAB — CBC
HEMATOCRIT: 39.2 % (ref 35.0–47.0)
HEMOGLOBIN: 12.8 g/dL (ref 12.0–16.0)
MCH: 28.4 pg (ref 26.0–34.0)
MCHC: 32.7 g/dL (ref 32.0–36.0)
MCV: 86.8 fL (ref 80.0–100.0)
Platelets: 151 10*3/uL (ref 150–440)
RBC: 4.52 MIL/uL (ref 3.80–5.20)
RDW: 14.1 % (ref 11.5–14.5)
WBC: 7.2 10*3/uL (ref 3.6–11.0)

## 2014-12-24 LAB — LIPASE, BLOOD: LIPASE: 17 U/L — AB (ref 22–51)

## 2014-12-24 MED ORDER — METRONIDAZOLE 500 MG PO TABS: 500.0000 mg | ORAL_TABLET | Freq: Two times a day (BID) | ORAL | Status: AC

## 2014-12-24 MED ORDER — CIPROFLOXACIN HCL 500 MG PO TABS: 500.0000 mg | ORAL_TABLET | Freq: Two times a day (BID) | ORAL | Status: AC

## 2014-12-24 NOTE — ED Notes (Signed)
Pt presents with diarrhea started last night. Recently admitted same last week.

## 2014-12-24 NOTE — ED Notes (Signed)
Blood Glucose reading of 178 per finger stick

## 2014-12-24 NOTE — ED Provider Notes (Signed)
Time Seen: Approximately 1320  I have reviewed the triage notes  Chief Complaint: Diarrhea   History of Present Illness: Molly Hoover is a 69 y.o. female who was recently hospitalized and had at that time some persistent nausea vomiting and diarrhea. Patient was noted to have a small amount of blood in her stool and was scheduled on an outpatient basis to follow up with her gastroenterologist for possible colonoscopy. Recent states that this morning she noticed that she had some "" coffee ground type looking stool and some small amount of bright red blood after she had had repetitive bowel movements at home of loose watery stool. She denies any significant abdominal or rectal pain at this time. She is now aware of any fever at home. She denies any feelings of lightheadedness, chest pain, shortness of breath.    Past Medical History  Diagnosis Date  . Hypertension   . Diabetes mellitus without complication   . Hiatal hernia   . GERD (gastroesophageal reflux disease)   . Hyperlipidemia   . Degenerative disc disease   . Anxiety   . Depression   . Allergic rhinitis   . COPD (chronic obstructive pulmonary disease)   . Diabetic peripheral neuropathy   . Hypothyroidism   . Vitamin D deficiency   . Bilateral breast cysts   . Asthma   . Diverticulosis   . PVD (peripheral vascular disease)   . Enlarged heart   . Syncope and collapse     Patient Active Problem List   Diagnosis Date Noted  . Intractable nausea and vomiting 12/19/2014  . Fecal occult blood test positive 12/19/2014  . COPD, moderate 11/19/2014  . Edema extremities 11/19/2014  . H/O gastrointestinal disease 11/19/2014  . Combined fat and carbohydrate induced hyperlipemia 11/19/2014  . Muscle spasms of head and/or neck 11/19/2014  . Idiopathic osteoporosis 11/19/2014  . 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 Diabetes mellitus type 2, uncontrolled 11/19/2014  . Venous insufficiency of leg 11/19/2014  .  Morbid obesity 10/17/2013  . Essential hypertension 10/17/2013  . Chronic cough 10/17/2013  . Chest pain on exertion 10/17/2013    Past Surgical History  Procedure Laterality Date  . Cholecystectomy    . Total abdominal hysterectomy    . Cervical fusion  2002  . Lumbar fusion      L1-4   . Carpal tunnel release      right  . Hernia repair    . Breast biopsy    . Esophagogastroduodenoscopy  03/2014    benign stricture dilated    Past Surgical History  Procedure Laterality Date  . Cholecystectomy    . Total abdominal hysterectomy    . Cervical fusion  2002  . Lumbar fusion      L1-4   . Carpal tunnel release      right  . Hernia repair    . Breast biopsy    . Esophagogastroduodenoscopy  03/2014    benign stricture dilated    Current Outpatient Rx  Name  Route  Sig  Dispense  Refill  . alendronate (FOSAMAX) 70 MG tablet   Oral   Take 70 mg by mouth once a week. Pt takes on Thursday.         Marland Kitchen atorvastatin (LIPITOR) 20 MG tablet   Oral   Take 20 mg by mouth at bedtime.         . Cranberry 500 MG CAPS   Oral   Take 500 mg by mouth  daily.          . feeding supplement, GLUCERNA SHAKE, (GLUCERNA SHAKE) LIQD   Oral   Take 237 mLs by mouth 3 (three) times daily between meals.   30 Can   0   . gemfibrozil (LOPID) 600 MG tablet   Oral   Take 600 mg by mouth 2 (two) times daily.         Marland Kitchen losartan (COZAAR) 25 MG tablet   Oral   Take 25 mg by mouth daily.         . metFORMIN (GLUCOPHAGE) 500 MG tablet   Oral   Take 500 mg by mouth 2 (two) times daily.         . metoCLOPramide (REGLAN) 5 MG tablet   Oral   Take 1 tablet (5 mg total) by mouth 3 (three) times daily before meals.   30 tablet   0   . mometasone-formoterol (DULERA) 100-5 MCG/ACT AERO   Inhalation   Inhale 2 puffs into the lungs 2 (two) times daily.         Marland Kitchen omega-3 acid ethyl esters (LOVAZA) 1 G capsule   Oral   Take 1 g by mouth 2 (two) times daily.         Marland Kitchen omeprazole  (PRILOSEC) 20 MG capsule   Oral   Take 20 mg by mouth daily.         . sitaGLIPtin (JANUVIA) 100 MG tablet   Oral   Take 100 mg by mouth daily.         Marland Kitchen tiotropium (SPIRIVA) 18 MCG inhalation capsule   Inhalation   Place 18 mcg into inhaler and inhale daily.         . Vitamin D, Ergocalciferol, (DRISDOL) 50000 UNITS CAPS capsule   Oral   Take 50,000 Units by mouth every 7 (seven) days. Pt takes on Thursday.         . ciprofloxacin (CIPRO) 500 MG tablet   Oral   Take 1 tablet (500 mg total) by mouth 2 (two) times daily.   10 tablet   0   . insulin detemir (LEVEMIR) 100 UNIT/ML injection   Subcutaneous   Inject 0.22 mLs (22 Units total) into the skin daily.   10 mL   11   . metroNIDAZOLE (FLAGYL) 500 MG tablet   Oral   Take 1 tablet (500 mg total) by mouth 2 (two) times daily.   10 tablet   0     Allergies:  Codeine; Combivent; and Ivp dye  Family History: Family History  Problem Relation Age of Onset  . Heart attack Mother 3  . Hypertension Mother   . Heart attack Father     Social History: Social History  Substance Use Topics  . Smoking status: Former Smoker -- 15 years    Types: Cigarettes  . Smokeless tobacco: None  . Alcohol Use: 1.2 oz/week    2 Standard drinks or equivalent per week     Comment: occasional     Review of Systems:   10 point review of systems was performed and was otherwise negative:  Constitutional: No fever Eyes: No visual disturbances ENT: No sore throat, ear pain Cardiac: No chest pain Respiratory: No shortness of breath, wheezing, or stridor Abdomen: No abdominal pain, no vomiting, No diarrhea Endocrine: No weight loss, No night sweats Extremities: No peripheral edema, cyanosis Skin: No rashes, easy bruising Neurologic: No focal weakness, trouble with speech or swollowing Urologic: No dysuria, Hematuria,  or urinary frequency   Physical Exam:  ED Triage Vitals  Enc Vitals Group     BP 12/24/14 1138  155/91 mmHg     Pulse Rate 12/24/14 1138 73     Resp 12/24/14 1138 18     Temp 12/24/14 1138 98 F (36.7 C)     Temp Source 12/24/14 1138 Oral     SpO2 12/24/14 1138 97 %     Weight 12/24/14 1138 230 lb (104.327 kg)     Height 12/24/14 1138 5\' 2"  (1.575 m)     Head Cir --      Peak Flow --      Pain Score 12/24/14 1143 4     Pain Loc --      Pain Edu? --      Excl. in [place]? --     General: Awake , Alert , and Oriented times 3; GCS 15 Head: Normal cephalic , atraumatic Eyes: Pupils equal , round, reactive to light Nose/Throat: No nasal drainage, patent upper airway without erythema or exudate.  Neck: Supple, Full range of motion, No anterior adenopathy or palpable thyroid masses Lungs: Clear to ascultation without wheezes , rhonchi, or rales Heart: Regular rate, regular rhythm without murmurs , gallops , or rubs Abdomen: Soft, non tender without rebound, guarding , or rigidity; bowel sounds positive and symmetric in all 4 quadrants. No organomegaly .        Extremities: 2 plus symmetric pulses. No edema, clubbing or cyanosis Neurologic: normal ambulation, Motor symmetric without deficits, sensory intact Skin: warm, dry, no rashes   Labs:   All laboratory work was reviewed including any pertinent negatives or positives listed below:  Labs Reviewed  LIPASE, BLOOD - Abnormal; Notable for the following:    Lipase 17 (*)    All other components within normal limits  COMPREHENSIVE METABOLIC PANEL - Abnormal; Notable for the following:    Chloride 99 (*)    Glucose, Bld 241 (*)    All other components within normal limits  CBC       Radiology: Patient had a recent abdominal pelvic CT which was reviewed I personally reviewed the radiologic studies       ED Course: Patient was observed here in emergency department and had no episodes of diarrhea. Rectal temperature was performed she is afebrile and her was no stool in the rectal vault. Not any indications of guaiac  positive stool on Hemoccult. I had a phone consultation with patient's gastroenterologist which she has tentatively scheduled an upcoming endoscopic and colonoscopy exam. He advised that we could start the patient on Cipro and Flagyl and he would see the patient in the office in the near future.   Assessment:  Diarrhea  Final Clinical Impression:  Final diagnoses:  Diarrhea     Plan:Patient was advised to return immediately if condition worsens. Patient was advised to follow up with her primary care physician or other specialized physicians involved and in their current assessment. It was felt the patient could be discharged to follow up with her gastroenterologist. He has requested she will be started on ciprofloxacin and Flagyl          Go to top  Daymon Larsen, MD 12/24/14 2311

## 2014-12-24 NOTE — ED Notes (Addendum)
hemacult testing performed and resulted negative. No blood noted in stool. Pt has been unable to provide stool sample while in ED and reports having no urge to have a bowel movement. Upon assessment of rectum pt had no stool present and upon manual heamcult exam scant amounts of stool were found. MD notified. GI MD will be paged per EDP verbal order.

## 2014-12-24 NOTE — Discharge Instructions (Signed)
Diarrhea Diarrhea is frequent loose and watery bowel movements. It can cause you to feel weak and dehydrated. Dehydration can cause you to become tired and thirsty, have a dry mouth, and have decreased urination that often is dark yellow. Diarrhea is a sign of another problem, most often an infection that will not last long. In most cases, diarrhea typically lasts 2-3 days. However, it can last longer if it is a sign of something more serious. It is important to treat your diarrhea as directed by your caregiver to lessen or prevent future episodes of diarrhea. CAUSES  Some common causes include:  Gastrointestinal infections caused by viruses, bacteria, or parasites.  Food poisoning or food allergies.  Certain medicines, such as antibiotics, chemotherapy, and laxatives.  Artificial sweeteners and fructose.  Digestive disorders. HOME CARE INSTRUCTIONS  Ensure adequate fluid intake (hydration): Have 1 cup (8 oz) of fluid for each diarrhea episode. Avoid fluids that contain simple sugars or sports drinks, fruit juices, whole milk products, and sodas. Your urine should be clear or pale yellow if you are drinking enough fluids. Hydrate with an oral rehydration solution that you can purchase at pharmacies, retail stores, and online. You can prepare an oral rehydration solution at home by mixing the following ingredients together:   - tsp table salt.   tsp baking soda.   tsp salt substitute containing potassium chloride.  1  tablespoons sugar.  1 L (34 oz) of water.  Certain foods and beverages may increase the speed at which food moves through the gastrointestinal (GI) tract. These foods and beverages should be avoided and include:  Caffeinated and alcoholic beverages.  High-fiber foods, such as raw fruits and vegetables, nuts, seeds, and whole grain breads and cereals.  Foods and beverages sweetened with sugar alcohols, such as xylitol, sorbitol, and mannitol.  Some foods may be well  tolerated and may help thicken stool including:  Starchy foods, such as rice, toast, pasta, low-sugar cereal, oatmeal, grits, baked potatoes, crackers, and bagels.  Bananas.  Applesauce.  Add probiotic-rich foods to help increase healthy bacteria in the GI tract, such as yogurt and fermented milk products.  Wash your hands well after each diarrhea episode.  Only take over-the-counter or prescription medicines as directed by your caregiver.  Take a warm bath to relieve any burning or pain from frequent diarrhea episodes. SEEK IMMEDIATE MEDICAL CARE IF:   You are unable to keep fluids down.  You have persistent vomiting.  You have blood in your stool, or your stools are black and tarry.  You do not urinate in 6-8 hours, or there is only a small amount of very dark urine.  You have abdominal pain that increases or localizes.  You have weakness, dizziness, confusion, or light-headedness.  You have a severe headache.  Your diarrhea gets worse or does not get better.  You have a fever or persistent symptoms for more than 2-3 days.  You have a fever and your symptoms suddenly get worse. MAKE SURE YOU:   Understand these instructions.  Will watch your condition.  Will get help right away if you are not doing well or get worse. Document Released: 04/02/2002 Document Revised: 08/27/2013 Document Reviewed: 12/19/2011 [facility] Patient Information 2015 [place], [place]. This information is not intended to replace advice given to you by your health care provider. Make sure you discuss any questions you have with your health care provider.   Please return immediately if condition worsens. Please contact her primary physician or the physician you  were given for referral. If you have any specialist physicians involved in her treatment and plan please also contact them. Thank you for using [facility] emergency Department.

## 2014-12-25 ENCOUNTER — Ambulatory Visit: Payer: Self-pay | Admitting: Podiatry

## 2014-12-25 LAB — O&P RESULT

## 2014-12-25 LAB — GIARDIA, EIA; OVA/PARASITE: GIARDIA AG STL: NEGATIVE

## 2014-12-26 ENCOUNTER — Other Ambulatory Visit: Payer: Self-pay

## 2014-12-26 ENCOUNTER — Other Ambulatory Visit: Payer: Self-pay | Admitting: Internal Medicine

## 2014-12-26 LAB — GLUCOSE, CAPILLARY: Glucose-Capillary: 178 mg/dL — ABNORMAL HIGH (ref 65–99)

## 2014-12-26 MED ORDER — GLUCOSE BLOOD VI STRP
ORAL_STRIP | Status: DC
Start: 2014-12-26 — End: 2016-01-21

## 2014-12-27 ENCOUNTER — Ambulatory Visit: Payer: Self-pay | Admitting: Internal Medicine

## 2014-12-27 NOTE — Discharge Summary (Signed)
Molly Hoover, is a 69 y.o. female  DOB [birth year]  MRN [id number].  Admission date:  12/19/2014  Admitting Physician  Lytle Butte, MD  Discharge Date:  12/23/2014   Primary MD  Halina Maidens, MD  Recommendations for primary care physician for things to follow:   Up with primary doctor in 1 week   Admission Diagnosis  Generalized abdominal pain [R10.84] Acute GI bleeding [K92.2]   Discharge Diagnosis  Generalized abdominal pain [R10.84] Acute GI bleeding [K92.2]    Principal Problem:   Intractable nausea and vomiting Active Problems:   Fecal occult blood test positive      Past Medical History  Diagnosis Date  . Hypertension   . Diabetes mellitus without complication   . Hiatal hernia   . GERD (gastroesophageal reflux disease)   . Hyperlipidemia   . Degenerative disc disease   . Anxiety   . Depression   . Allergic rhinitis   . COPD (chronic obstructive pulmonary disease)   . Diabetic peripheral neuropathy   . Hypothyroidism   . Vitamin D deficiency   . Bilateral breast cysts   . Asthma   . Diverticulosis   . PVD (peripheral vascular disease)   . Enlarged heart   . Syncope and collapse     Past Surgical History  Procedure Laterality Date  . Cholecystectomy    . Total abdominal hysterectomy    . Cervical fusion  2002  . Lumbar fusion      L1-4   . Carpal tunnel release      right  . Hernia repair    . Breast biopsy    . Esophagogastroduodenoscopy  03/2014    benign stricture dilated       History of present illness and  Hospital Course:     Kindly see H&P for history of present illness and admission details, please review complete Labs, Consult reports and Test reports for all details in brief  HPI  from the history and physical done on the day of admission  69 year old  female patient with history of diabetes, hypertension came in secondary to nausea vomiting diarrhea. Some blood in the stool. Admitted to medical service on telemetry secondary to intractable nausea vomiting and bloody diarrhea.  Hospital Course    Abdominal pain nausea vomiting diarrhea symptoms are consistent with viral gastroenteritis. Stool cultures negative for CD4 1  This. Symptoms improved with IV fluids. Patient to reck to be also improved with that time. Seen by gastroenterology , she'll plan was to get EGD and colonoscopy. But her symptoms of nausea vomiting diarrhea improved. Patient received Reglan for nausea, patient possibly have diabetic gastroparesis.  she needs outpatient the motility studies. 2.type 2 diabetes mellitus poorly controlled: Seen by diabetic nurse. Patient is on Levemir. She was given insulin [. When necessary home health regarding insulin administration and diabetes. He was taking metformin, Januvia before but secondary to poorly controlled her diabetes we have changed her regimen to Levemir, mealtime insulin. Hemoglobin A1c is 10. #3 history of COPD without any wheezing. But the she was having shortness of breath. And O2 sats dropped to 83% on exertion went up to 97% on room air. Discharged with one  L of oxygen for her and advised her to use it when she ambulates.Advised her to follow up with pulmonologist for baseline now pulmonary function testing.   Discharge Condition:   Follow UP  Follow-up Information    Follow up with Halina Maidens, MD. Go on 01/02/2015.  Specialty:  Family Medicine   Why:  You have a follow up with Dr. Army Melia 01/02/15 at 11:15am   Contact information:   [address] [place] [address] [phone]       Follow up with Halina Maidens, MD. Go on 01/02/2015.   Specialty:  Family Medicine   Contact information:   [address] [place] [address] [phone]         Discharge Instructions  and   Discharge Medications     Discharge Instructions    Face-to-face encounter (required for Medicare/Medicaid patients)    Complete by:  As directed   I [facility] certify that this patient is under my care and that I, or a nurse practitioner or physician's assistant working with me, had a face-to-face encounter that meets the physician face-to-face encounter requirements with this patient on 12/23/2014. The encounter with the patient was in whole, or in part for the following medical condition(s) which is the primary reason for home health care  DMII; uncontrolled,needs home RN to teach about Insulin htn Copd with hypoxia  The encounter with the patient was in whole, or in part, for the following medical condition, which is the primary reason for home health care:  full  I certify that, based on my findings, the following services are medically necessary home health services:  Nursing  Reason for Medically Necessary Home Health Services:  Skilled Nursing- Teaching of Disease Process/Symptom Management  My clinical findings support the need for the above services:  OTHER SEE COMMENTS  Further, I certify that my clinical findings support that this patient is homebound due to:  Ambulates short distances less than 300 feet     Home Health    Complete by:  As directed   To provide the following care/treatments:  RN            Medication List    STOP taking these medications        atorvastatin 20 MG tablet  Commonly known as:  LIPITOR     gemfibrozil 600 MG tablet  Commonly known as:  LOPID     JANUVIA 100 MG tablet  Generic drug:  sitaGLIPtin     losartan 25 MG tablet  Commonly known as:  COZAAR     metFORMIN 500 MG tablet  Commonly known as:  GLUCOPHAGE     omega-3 acid ethyl esters 1 G capsule  Commonly known as:  LOVAZA     omeprazole 20 MG capsule  Commonly known as:  PRILOSEC     SPIRIVA HANDIHALER 18 MCG inhalation capsule  Generic drug:  tiotropium       TAKE these medications        alendronate 70 MG tablet  Commonly known as:  FOSAMAX  Take 70 mg by mouth once a week. Pt takes on Thursday.     Cranberry 500 MG Caps  Take 500 mg by mouth daily.     feeding supplement (GLUCERNA SHAKE) Liqd  Take 237 mLs by mouth 3 (three) times daily between meals.     insulin detemir 100 UNIT/ML injection  Commonly known as:  LEVEMIR  Inject 0.22 mLs (22 Units total) into the skin daily.     metoCLOPramide 5 MG tablet  Commonly known as:  REGLAN  Take 1 tablet (5 mg total) by mouth 3 (three) times daily before meals.     mometasone-formoterol 100-5 MCG/ACT Aero  Commonly known as:  DULERA  Inhale 2 puffs into  the lungs 2 (two) times daily.     Vitamin D (Ergocalciferol) 50000 UNITS Caps capsule  Commonly known as:  DRISDOL  Take 50,000 Units by mouth every 7 (seven) days. Pt takes on Thursday.          Diet and Activity recommendation: See Discharge Instructions above   Consults obtained - gi   Major procedures and Radiology Reports - PLEASE review detailed and final reports for all details, in brief -     Ct Angio Abdomen W/cm &/or Wo Contrast  12/19/2014   CLINICAL DATA:  Generalized abdominal pain, vomiting, and diarrhea. Recurrent problem with sudden onset. Colicky pain.  EXAM: CT ANGIOGRAPHY ABDOMEN  TECHNIQUE: Multidetector CT imaging of the abdomen was performed using the standard protocol during bolus administration of intravenous contrast. Multiplanar reconstructed images including MIPs were obtained and reviewed to evaluate the vascular anatomy.  CONTRAST:  174mL OMNIPAQUE IOHEXOL 350 MG/ML SOLN  COMPARISON:  None.  FINDINGS: Slight fibrosis in the lung bases.  Normal caliber abdominal aorta. Diffuse calcific and noncalcific atherosclerotic changes. No aortic dissection or aneurysm. The abdominal aorta, celiac axis, superior mesenteric artery, single right and duplicated left renal arteries, inferior mesenteric artery, and  bilateral iliac, external iliac, internal iliac, and common femoral arteries are patent. No critical stenosis is demonstrated. Hepatic veins, portal veins, splenic veins, and superior mesenteric veins are patent. Renal nephrograms are symmetrical.  Surgical absence of the gallbladder. The liver, spleen, pancreas, adrenal glands, inferior vena cava, and retroperitoneal lymph nodes are unremarkable. Sub cm low-attenuation lesions in the kidneys likely represent small cysts. No hydronephrosis. Stomach, small bowel, and colon are not abnormally distended. No free air or free fluid in the abdomen. Small umbilical hernia containing fat.  Pelvis: The appendix is normal. Diverticulosis in the sigmoid colon without evidence of diverticulitis. Bladder is decompressed. Uterus is surgically absent. No free or loculated pelvic fluid collections. No pelvic mass or lymphadenopathy. Postoperative changes with posterior laminectomies and posterior rod and screw fixation from L4 to the sacrum. Degenerative changes throughout the lumbar spine. No destructive bone lesions demonstrated.  Review of the MIP images confirms the above findings.  IMPRESSION: Atherosclerotic changes in the abdominal aorta. No evidence of aneurysm, dissection, or occlusion of the abdominal aorta, branch vessels, or portal venous system.   Electronically Signed   By: Lucienne Capers M.D.   On: 12/19/2014 21:03   Dg Abd Acute W/chest  12/19/2014   CLINICAL DATA:  Intermittent abdominal pain since August 9. Diarrhea with medications. Sent for further evaluation by primary physician. Generalized abdominal cramping.  EXAM: DG ABDOMEN ACUTE W/ 1V CHEST  COMPARISON:  Chest 06/19/2014  FINDINGS: Cardiac enlargement with borderline pulmonary vascularity. No significant edema or consolidation. No blunting of costophrenic angles. No pneumothorax. Degenerative changes in the shoulders. Postoperative change in the cervical spine.  Scattered gas and stool throughout the  colon. No small or large bowel distention. No free intra-abdominal air. No abnormal air-fluid levels. Surgical clips in the right upper quadrant. Postoperative changes in the lower lumbar spine. No radiopaque stones.  IMPRESSION: Cardiac enlargement. No evidence of edema or consolidation. Normal nonobstructive bowel gas pattern.   Electronically Signed   By: Lucienne Capers M.D.   On: 12/19/2014 18:05    Micro Results    Recent Results (from the past 240 hour(s))  C difficile quick scan w PCR reflex     Status: None   Collection Time: 12/19/14  7:37 PM  Result Value Ref Range Status  C Diff antigen NEGATIVE NEGATIVE Final   C Diff toxin NEGATIVE NEGATIVE Final   C Diff interpretation Negative for C. difficile  Final  Giardia, EIA; Ova/Parasite     Status: None   Collection Time: 12/20/14  9:11 PM  Result Value Ref Range Status   Ova + Parasite Exam Final report  Final    Comment: (NOTE) These results were obtained using wet preparation(s) and trichrome stained smear. This test does not include testing for Cryptosporidium parvum, Cyclospora, or Microsporidia.    Giardia Ag, Stl Negative Negative Final    Comment: (NOTE) Performed At: [facility] [address] [place], [place] [id number] Caprice Red MD YZ:[id number] Performed At: [facility] [address] [place], [place] [id number] Lindon Romp MD GO:[id number]        Today   Subjective:   Molly Hoover today has no headache,no chest abdominal pain,no new weakness tingling or numbness, feels much better wants to go home today.   Objective:   Blood pressure 165/90, pulse 71, temperature 98.3 F (36.8 C), temperature source Oral, resp. rate 16, height 5\' 2"  (1.575 m), weight 105.779 kg (233 lb 3.2 oz), SpO2 97 %.  No intake or output data in the 24 hours ending 12/27/14 0725  Exam Awake Alert, Oriented x 3, No new F.N deficits, Normal affect [place].AT,PERRAL Supple Neck,No JVD, No cervical  lymphadenopathy appriciated.  Symmetrical Chest wall movement, Good air movement bilaterally, CTAB RRR,No Gallops,Rubs or new Murmurs, No Parasternal Heave +ve B.Sounds, Abd Soft, Non tender, No organomegaly appriciated, No rebound -guarding or rigidity. No Cyanosis, Clubbing or edema, No new Rash or bruise  Data Review   CBC w Diff:  Lab Results  Component Value Date   WBC 7.2 12/24/2014   HGB 12.8 12/24/2014   HCT 39.2 12/24/2014   PLT 151 12/24/2014   LYMPHOPCT 9 12/19/2014   MONOPCT 5 12/19/2014   EOSPCT 1 12/19/2014   BASOPCT 0 12/19/2014    CMP:  Lab Results  Component Value Date   NA 137 12/24/2014   K 4.1 12/24/2014   CL 99* 12/24/2014   CO2 29 12/24/2014   BUN 9 12/24/2014   BUN 14 08/27/2014   CREATININE 0.66 12/24/2014   CREATININE 0.6 08/27/2014   PROT 6.9 12/24/2014   ALBUMIN 4.1 12/24/2014   BILITOT 1.1 12/24/2014   ALKPHOS 55 12/24/2014   AST 19 12/24/2014   ALT 19 12/24/2014  .   Total Time in preparing paper work, data evaluation and todays exam - 66 minutes  Mollyann Halbert M.D on 12/23/2014 at 7:25 AM

## 2015-01-01 ENCOUNTER — Encounter (INDEPENDENT_AMBULATORY_CARE_PROVIDER_SITE_OTHER): Payer: Medicare Other | Admitting: Internal Medicine

## 2015-01-01 DIAGNOSIS — IMO0002 Reserved for concepts with insufficient information to code with codable children: Secondary | ICD-10-CM

## 2015-01-01 DIAGNOSIS — J449 Chronic obstructive pulmonary disease, unspecified: Secondary | ICD-10-CM

## 2015-01-01 DIAGNOSIS — R1110 Vomiting, unspecified: Secondary | ICD-10-CM

## 2015-01-01 DIAGNOSIS — E1165 Type 2 diabetes mellitus with hyperglycemia: Secondary | ICD-10-CM | POA: Diagnosis not present

## 2015-01-01 DIAGNOSIS — R195 Other fecal abnormalities: Secondary | ICD-10-CM

## 2015-01-01 NOTE — Progress Notes (Signed)
Patient ID: Molly Hoover, female   DOB: 1946-04-12, 69 y.o.   MRN: [id number]  Home  Health orders from [facility] Start of care  12/25/2014 until 02/22/2015.  home health orders are reviewed. Orders are signed and dated and faxed.

## 2015-01-02 ENCOUNTER — Ambulatory Visit (INDEPENDENT_AMBULATORY_CARE_PROVIDER_SITE_OTHER): Payer: Medicare Other | Admitting: Internal Medicine

## 2015-01-02 ENCOUNTER — Encounter: Payer: Self-pay | Admitting: Internal Medicine

## 2015-01-02 VITALS — BP 124/80 | HR 72 | Ht 62.0 in | Wt 225.0 lb

## 2015-01-02 DIAGNOSIS — IMO0002 Reserved for concepts with insufficient information to code with codable children: Secondary | ICD-10-CM

## 2015-01-02 DIAGNOSIS — J449 Chronic obstructive pulmonary disease, unspecified: Secondary | ICD-10-CM | POA: Diagnosis not present

## 2015-01-02 DIAGNOSIS — I10 Essential (primary) hypertension: Secondary | ICD-10-CM

## 2015-01-02 DIAGNOSIS — R1110 Vomiting, unspecified: Secondary | ICD-10-CM

## 2015-01-02 DIAGNOSIS — Z23 Encounter for immunization: Secondary | ICD-10-CM

## 2015-01-02 DIAGNOSIS — E1165 Type 2 diabetes mellitus with hyperglycemia: Secondary | ICD-10-CM | POA: Diagnosis not present

## 2015-01-02 MED ORDER — METFORMIN HCL 500 MG PO TABS: 500.0000 mg | ORAL_TABLET | Freq: Two times a day (BID) | ORAL | Status: DC

## 2015-01-02 MED ORDER — INSULIN PEN NEEDLE 31G X 5 MM MISC: 1.0000 | Freq: Every day | Status: DC

## 2015-01-02 MED ORDER — INSULIN DETEMIR 100 UNIT/ML FLEXPEN: 22.0000 [IU] | PEN_INJECTOR | Freq: Every day | SUBCUTANEOUS | Status: DC

## 2015-01-02 NOTE — Progress Notes (Signed)
Date:  01/02/2015   Name:  Molly Hoover   DOB:  08-26-1945   MRN:  [id number]   Chief Complaint: Hospitalization Follow-up Diabetes She presents for her follow-up diabetic visit. She has type 2 diabetes mellitus. Pertinent negatives for hypoglycemia include no nervousness/anxiousness, pallor or tremors. Pertinent negatives for diabetes include no chest pain and no fatigue. Required glucagon injection: gastroenter. Current diabetic treatment includes insulin injections. She is compliant with treatment all of the time (pt does not want to do 4 injections per day. She also wants to switch to that. Instead of the vial.).  GI Problem The primary symptoms include hematochezia. Primary symptoms do not include fever, fatigue, abdominal pain or diarrhea. The onset was sudden. The problem has been resolved.  The illness does not include chills. Associated symptoms comments: Patient was hospitalized with severe nausea and vomiting and diarrhea. For workup was unrevealing. It was felt that this was due to a virus. She reports symptoms have completely resolved. Her stools are normal. Her appetite is normal..  Rectal Bleeding  The current episode started more than 2 weeks ago. The onset was sudden. The problem has been resolved. The patient is experiencing no pain (She is scheduled to follow-up with gastroenterology as an outpatient. She will be seeing Dr. Rayann Heman.). The stool is described as soft. Pertinent negatives include no fever, no abdominal pain, no diarrhea, no chest pain and no coughing.   After hospitalization she was sent home with home health. Is also sent home with home oxygen therapy. In the hospital her O2 at rest was in the 90s but with exercise it dropped down to the mid 80s. She states that she is not using oxygen. She does not feel that she needs it any longer. She still has home health evaluating her and have encouraged her to initiate up with their recommendations.  Review of  Systems:  Review of Systems  Constitutional: Negative for fever, chills and fatigue.  Respiratory: Negative for cough, choking and shortness of breath.   Cardiovascular: Negative for chest pain, palpitations and leg swelling.  Gastrointestinal: Positive for hematochezia. Negative for abdominal pain, diarrhea, blood in stool and abdominal distention.  Genitourinary: Negative for frequency and pelvic pain.  Skin: Negative for color change and pallor.  Neurological: Negative for tremors, syncope and light-headedness.  Psychiatric/Behavioral: Negative for dysphoric mood. The patient is not nervous/anxious.     Patient Active Problem List   Diagnosis Date Noted  . Intractable nausea and vomiting 12/19/2014  . Fecal occult blood test positive 12/19/2014  . COPD, moderate 11/19/2014  . Edema extremities 11/19/2014  . H/O gastrointestinal disease 11/19/2014  . Combined fat and carbohydrate induced hyperlipemia 11/19/2014  . Muscle spasms of head and/or neck 11/19/2014  . Idiopathic osteoporosis 11/19/2014  . 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 Diabetes mellitus type 2, uncontrolled 11/19/2014  . Venous insufficiency of leg 11/19/2014  . Morbid obesity 10/17/2013  . Essential hypertension 10/17/2013  . Chronic cough 10/17/2013  . Chest pain on exertion 10/17/2013    Prior to Admission medications   Medication Sig Start Date End Date Taking? Authorizing Provider  alendronate (FOSAMAX) 70 MG tablet Take 70 mg by mouth once a week. Pt takes on Thursday.   Yes Historical Provider, MD  atorvastatin (LIPITOR) 20 MG tablet Take 20 mg by mouth at bedtime.   Yes Historical Provider, MD  Cranberry 500 MG CAPS Take 500 mg by mouth daily.    Yes Historical Provider, MD  gemfibrozil (LOPID)  600 MG tablet Take 600 mg by mouth 2 (two) times daily.   Yes Historical Provider, MD  glucose blood (ONE TOUCH ULTRA TEST) test strip Use 4 times per day to test blood sugar. 12/26/14  Yes Glean Hess,  MD  HUMALOG 100 UNIT/ML injection  12/23/14  Yes Historical Provider, MD  insulin detemir (LEVEMIR) 100 UNIT/ML injection Inject 0.22 mLs (22 Units total) into the skin daily. 12/23/14  Yes Epifanio Lesches, MD  losartan (COZAAR) 25 MG tablet Take 25 mg by mouth daily.   Yes Historical Provider, MD  meloxicam (MOBIC) 15 MG tablet  11/29/14  Yes Historical Provider, MD  metoCLOPramide (REGLAN) 5 MG tablet Take 1 tablet (5 mg total) by mouth 3 (three) times daily before meals. 12/23/14  Yes Epifanio Lesches, MD  mometasone-formoterol (DULERA) 100-5 MCG/ACT AERO Inhale 2 puffs into the lungs 2 (two) times daily.   Yes Historical Provider, MD  omega-3 acid ethyl esters (LOVAZA) 1 G capsule Take 1 g by mouth 2 (two) times daily.   Yes Historical Provider, MD  omeprazole (PRILOSEC) 20 MG capsule Take 20 mg by mouth daily.   Yes Historical Provider, MD  sitaGLIPtin (JANUVIA) 100 MG tablet Take 100 mg by mouth daily.   Yes Historical Provider, MD  tiotropium (SPIRIVA) 18 MCG inhalation capsule Place 18 mcg into inhaler and inhale daily.   Yes Historical Provider, MD  Vitamin D, Ergocalciferol, (DRISDOL) 50000 UNITS CAPS capsule Take 50,000 Units by mouth every 7 (seven) days. Pt takes on Thursday.   Yes Historical Provider, MD    Allergies  Allergen Reactions  . Codeine Hives and Itching  . Combivent [Ipratropium-Albuterol] Itching  . Ivp Dye [Iodinated Diagnostic Agents] Hives and Itching    Past Surgical History  Procedure Laterality Date  . Cholecystectomy    . Total abdominal hysterectomy    . Cervical fusion  2002  . Lumbar fusion      L1-4   . Carpal tunnel release      right  . Hernia repair    . Breast biopsy    . Esophagogastroduodenoscopy  03/2014    benign stricture dilated    Social History  Substance Use Topics  . Smoking status: Former Smoker -- 15 years    Types: Cigarettes  . Smokeless tobacco: None  . Alcohol Use: 1.2 oz/week    2 Standard drinks or equivalent per  week     Comment: occasional     Medication list has been reviewed and updated.  Physical Examination:  Physical Exam  Constitutional: She is oriented to person, place, and time. She appears well-developed and well-nourished. No distress.  HENT:  Head: Normocephalic and atraumatic.  Eyes: Conjunctivae are normal. Right eye exhibits no discharge. Left eye exhibits no discharge. No scleral icterus.  Neck: Normal range of motion. Neck supple. No thyromegaly present.  Cardiovascular: Normal rate, regular rhythm and normal heart sounds.   Pulmonary/Chest: Effort normal and breath sounds normal. No respiratory distress. She has no wheezes.  Abdominal: Soft. There is no tenderness.  Musculoskeletal: Normal range of motion.  Neurological: She is alert and oriented to person, place, and time.  Skin: Skin is warm and dry. No rash noted.  Psychiatric: She has a normal mood and affect. Her behavior is normal. Thought content normal.    BP 124/80 mmHg  Pulse 72  Ht 5\' 2"  (1.575 m)  Wt 225 lb (102.059 kg)  BMI 41.14 kg/m2  Assessment and Plan: 1. Flu vaccine need -  Flu Vaccine QUAD 36+ mos PF IM (Fluarix & Fluzone Quad PF)  2. Intractable vomiting with nausea, vomiting of unspecified type Symptoms have completely resolved. She'll see GI in follow-up  3. Diabetes mellitus type 2, uncontrolled We'll continue Levemir and switch to the pen. We will hold NovoLog pre-meal. Resume metformin twice a day. - Insulin Detemir (LEVEMIR FLEXTOUCH) 100 UNIT/ML Pen; Inject 22 Units into the skin daily.  Dispense: 15 mL; Refill: 11 - metFORMIN (GLUCOPHAGE) 500 MG tablet; Take 1 tablet (500 mg total) by mouth 2 (two) times daily.  Dispense: 60 tablet; Refill: 5 - Insulin Pen Needle (FIFTY50 PEN NEEDLES) 31G X 5 MM MISC; 1 each by Does not apply route daily.  Dispense: 100 each; Refill: 0  4. Essential hypertension Controlled on current medications  5. COPD, moderate We will give note to discontinue  home oxygen   Halina Maidens, MD [facility]  01/02/2015

## 2015-01-03 ENCOUNTER — Ambulatory Visit: Payer: Self-pay | Admitting: Internal Medicine

## 2015-02-04 ENCOUNTER — Other Ambulatory Visit: Payer: Self-pay | Admitting: Internal Medicine

## 2015-02-19 ENCOUNTER — Other Ambulatory Visit: Payer: Self-pay | Admitting: Internal Medicine

## 2015-04-03 ENCOUNTER — Encounter: Payer: Self-pay | Admitting: Internal Medicine

## 2015-04-03 ENCOUNTER — Ambulatory Visit (INDEPENDENT_AMBULATORY_CARE_PROVIDER_SITE_OTHER): Payer: Medicare Other | Admitting: Internal Medicine

## 2015-04-03 ENCOUNTER — Other Ambulatory Visit: Payer: Self-pay

## 2015-04-03 VITALS — BP 144/88 | HR 68 | Ht 62.0 in | Wt 229.0 lb

## 2015-04-03 DIAGNOSIS — I10 Essential (primary) hypertension: Secondary | ICD-10-CM

## 2015-04-03 DIAGNOSIS — J449 Chronic obstructive pulmonary disease, unspecified: Secondary | ICD-10-CM

## 2015-04-03 DIAGNOSIS — Z794 Long term (current) use of insulin: Secondary | ICD-10-CM

## 2015-04-03 DIAGNOSIS — H6691 Otitis media, unspecified, right ear: Secondary | ICD-10-CM | POA: Diagnosis not present

## 2015-04-03 DIAGNOSIS — E1165 Type 2 diabetes mellitus with hyperglycemia: Secondary | ICD-10-CM | POA: Diagnosis not present

## 2015-04-03 DIAGNOSIS — IMO0001 Reserved for inherently not codable concepts without codable children: Secondary | ICD-10-CM

## 2015-04-03 MED ORDER — INSULIN PEN NEEDLE 31G X 5 MM MISC: 1.0000 | Freq: Every day | Status: DC

## 2015-04-03 MED ORDER — AZITHROMYCIN 250 MG PO TABS: ORAL_TABLET | ORAL | Status: DC

## 2015-04-03 NOTE — Progress Notes (Signed)
Date:  04/03/2015   Name:  Molly Hoover   DOB:  1945/08/06   MRN:  [id number]   Chief Complaint: Diabetes Diabetes She presents for her follow-up diabetic visit. She has type 2 diabetes mellitus. Pertinent negatives for hypoglycemia include no dizziness or headaches. Pertinent negatives for diabetes include no chest pain and no fatigue. Current diabetic treatment includes insulin injections and oral agent (dual therapy). Her weight is stable. Her breakfast blood glucose is taken between 7-8 am. Her breakfast blood glucose range is generally 130-140 mg/dl. Her dinner blood glucose range is generally 130-140 mg/dl.  Hypertension This is a chronic problem. The current episode started more than 1 year ago. The problem is unchanged. The problem is controlled. Pertinent negatives include no chest pain, headaches, palpitations or shortness of breath.  Otalgia  There is pain in the right ear. This is a recurrent problem. The current episode started 1 to 4 weeks ago. The problem occurs every few hours. The problem has been waxing and waning. There has been no fever. Associated symptoms include rhinorrhea. Pertinent negatives include no abdominal pain, coughing, ear discharge or headaches. She has tried nothing for the symptoms.   Fall - patient fell onto her hands and knee several days ago. She was on hardwood floor did not strike her head or lose consciousness. She believes that she fell because the toe of her shoe caught on the floor. She was not dizzy or lightheaded at the time.  Review of Systems  Constitutional: Negative for fever and fatigue.  HENT: Positive for ear pain and rhinorrhea. Negative for ear discharge.   Respiratory: Negative for cough, chest tightness, shortness of breath and wheezing.   Cardiovascular: Negative for chest pain, palpitations and leg swelling.  Gastrointestinal: Negative for abdominal pain.  Musculoskeletal: Positive for myalgias (fell onto knees a few days ago -  sore but no injury).  Neurological: Negative for dizziness, light-headedness and headaches.    Patient Active Problem List   Diagnosis Date Noted  . Intractable nausea and vomiting 12/19/2014  . Fecal occult blood test positive 12/19/2014  . COPD, moderate ([place]) 11/19/2014  . Edema extremities 11/19/2014  . H/O gastrointestinal disease 11/19/2014  . Combined fat and carbohydrate induced hyperlipemia 11/19/2014  . Muscle spasms of head and/or neck 11/19/2014  . Idiopathic osteoporosis 11/19/2014  . 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 Diabetes mellitus type 2, uncontrolled ([place]) 11/19/2014  . Venous insufficiency of leg 11/19/2014  . Morbid obesity ([place]) 10/17/2013  . Essential hypertension 10/17/2013  . Chronic cough 10/17/2013  . Chest pain on exertion 10/17/2013    Prior to Admission medications   Medication Sig Start Date End Date Taking? Authorizing Provider  alendronate (FOSAMAX) 70 MG tablet Take 70 mg by mouth once a week. Pt takes on Thursday.   Yes Historical Provider, MD  Cranberry 500 MG CAPS Take 500 mg by mouth daily.    Yes Historical Provider, MD  gemfibrozil (LOPID) 600 MG tablet Take 600 mg by mouth 2 (two) times daily.   Yes Historical Provider, MD  glucose blood (ONE TOUCH ULTRA TEST) test strip Use 4 times per day to test blood sugar. 12/26/14  Yes Glean Hess, MD  HUMALOG 100 UNIT/ML injection  12/23/14  Yes Historical Provider, MD  Insulin Detemir (LEVEMIR FLEXTOUCH) 100 UNIT/ML Pen Inject 22 Units into the skin daily. 01/02/15  Yes Glean Hess, MD  Insulin Pen Needle (FIFTY50 PEN NEEDLES) 31G X 5 MM MISC 1 each by Does  not apply route daily. 01/02/15  Yes Glean Hess, MD  JANUVIA 100 MG tablet TAKE 1 TABLET DAILY 02/04/15  Yes Glean Hess, MD  Lancets ([place]) lancets    Yes Historical Provider, MD  losartan (COZAAR) 25 MG tablet Take 25 mg by mouth daily.   Yes Historical Provider, MD  meloxicam (MOBIC) 15 MG tablet  11/29/14   Yes Historical Provider, MD  metFORMIN (GLUCOPHAGE) 500 MG tablet TAKE 1 TABLET TWICE A DAY 02/19/15  Yes Glean Hess, MD  metoCLOPramide (REGLAN) 5 MG tablet Take 1 tablet (5 mg total) by mouth 3 (three) times daily before meals. 12/23/14  Yes Epifanio Lesches, MD  mometasone-formoterol (DULERA) 100-5 MCG/ACT AERO Inhale 2 puffs into the lungs 2 (two) times daily.   Yes Historical Provider, MD  omega-3 acid ethyl esters (LOVAZA) 1 G capsule Take 1 g by mouth 2 (two) times daily.   Yes Historical Provider, MD  omeprazole (PRILOSEC) 20 MG capsule Take 20 mg by mouth daily.   Yes Historical Provider, MD  tiotropium (SPIRIVA) 18 MCG inhalation capsule Place 18 mcg into inhaler and inhale daily.   Yes Historical Provider, MD  Vitamin D, Ergocalciferol, (DRISDOL) 50000 UNITS CAPS capsule Take 50,000 Units by mouth every 7 (seven) days. Pt takes on Thursday.   Yes Historical Provider, MD  atorvastatin (LIPITOR) 20 MG tablet TAKE 1 TABLET AT BEDTIME 02/04/15   Glean Hess, MD    Allergies  Allergen Reactions  . Codeine Hives and Itching  . Combivent [Ipratropium-Albuterol] Itching  . Ivp Dye [Iodinated Diagnostic Agents] Hives and Itching    Past Surgical History  Procedure Laterality Date  . Cholecystectomy    . Total abdominal hysterectomy    . Cervical fusion  2002  . Lumbar fusion      L1-4   . Carpal tunnel release      right  . Hernia repair    . Breast biopsy    . Esophagogastroduodenoscopy  03/2014    benign stricture dilated    Social History  Substance Use Topics  . Smoking status: Former Smoker -- 15 years    Types: Cigarettes  . Smokeless tobacco: None  . Alcohol Use: 1.2 oz/week    2 Standard drinks or equivalent per week     Comment: occasional    Medication list has been reviewed and updated.   Physical Exam  Constitutional: She is oriented to person, place, and time. She appears well-developed. No distress.  HENT:  Head: Normocephalic and  atraumatic.  Right Ear: Ear canal normal. Tympanic membrane is scarred (and dull).  Left Ear: Tympanic membrane and ear canal normal.  Nose: Right sinus exhibits maxillary sinus tenderness. Left sinus exhibits maxillary sinus tenderness.  Eyes: Right eye exhibits no discharge. Left eye exhibits no discharge. No scleral icterus.  Neck: Normal range of motion. Neck supple.  Cardiovascular: Normal rate, regular rhythm and S1 normal.   Pulmonary/Chest: Effort normal and breath sounds normal. No respiratory distress.  Musculoskeletal: Normal range of motion. She exhibits no edema.       Right knee: She exhibits no ecchymosis and no deformity. Tenderness found.       Left knee: She exhibits no ecchymosis and no deformity. Tenderness found.  Neurological: She is alert and oriented to person, place, and time.  Skin: Skin is warm and dry. No rash noted.  Psychiatric: She has a normal mood and affect. Her behavior is normal. Thought content normal.    BP  150/80 mmHg  Pulse 68  Ht 5\' 2"  (1.575 m)  Wt 229 lb (103.874 kg)  BMI 41.87 kg/m2  Assessment and Plan: 1. Uncontrolled type 2 diabetes mellitus without complication, with long-term current use of insulin (HCC) Continue Levemir, Januvia, and metformin We will increase the dose of Levemir if needed - Hemoglobin A1c - Insulin Pen Needle (FIFTY50 PEN NEEDLES) 31G X 5 MM MISC; 1 each by Does not apply route daily.  Dispense: 100 each; Refill: 3  2. Essential hypertension Fair control on current regimen - CBC with Differential/Platelet  3. COPD, moderate ([place]) Stable with no recent exacerbation  4. Subacute otitis media of right ear, recurrence not specified, unspecified otitis media type Continue allergy medications Z-Pak for possible bacterial otitis media - azithromycin (ZITHROMAX Z-PAK) 250 MG tablet; Take 2 tabs on day #1 then one a day for 4 more days.  Dispense: 6 each; Refill: 0   Halina Maidens, MD [facility]  04/03/2015

## 2015-04-04 LAB — CBC WITH DIFFERENTIAL/PLATELET
BASOS: 1 %
Basophils Absolute: 0 10*3/uL (ref 0.0–0.2)
EOS (ABSOLUTE): 0.1 10*3/uL (ref 0.0–0.4)
EOS: 4 %
HEMATOCRIT: 41.7 % (ref 34.0–46.6)
HEMOGLOBIN: 13.5 g/dL (ref 11.1–15.9)
IMMATURE GRANS (ABS): 0 10*3/uL (ref 0.0–0.1)
IMMATURE GRANULOCYTES: 0 %
LYMPHS: 32 %
Lymphocytes Absolute: 1.2 10*3/uL (ref 0.7–3.1)
MCH: 28.6 pg (ref 26.6–33.0)
MCHC: 32.4 g/dL (ref 31.5–35.7)
MCV: 88 fL (ref 79–97)
Monocytes Absolute: 0.3 10*3/uL (ref 0.1–0.9)
Monocytes: 9 %
NEUTROS ABS: 2 10*3/uL (ref 1.4–7.0)
NEUTROS PCT: 54 %
Platelets: 189 10*3/uL (ref 150–379)
RBC: 4.72 x10E6/uL (ref 3.77–5.28)
RDW: 14.4 % (ref 12.3–15.4)
WBC: 3.7 10*3/uL (ref 3.4–10.8)

## 2015-04-04 LAB — HEMOGLOBIN A1C
Est. average glucose Bld gHb Est-mCnc: 183 mg/dL
HEMOGLOBIN A1C: 8 % — AB (ref 4.8–5.6)

## 2015-05-03 ENCOUNTER — Other Ambulatory Visit: Payer: Self-pay | Admitting: Internal Medicine

## 2015-07-14 ENCOUNTER — Other Ambulatory Visit: Payer: Self-pay | Admitting: Internal Medicine

## 2015-08-03 ENCOUNTER — Other Ambulatory Visit: Payer: Self-pay | Admitting: Internal Medicine

## 2015-08-06 ENCOUNTER — Other Ambulatory Visit: Payer: Self-pay | Admitting: Internal Medicine

## 2015-08-07 ENCOUNTER — Ambulatory Visit (INDEPENDENT_AMBULATORY_CARE_PROVIDER_SITE_OTHER): Payer: Medicare Other | Admitting: Internal Medicine

## 2015-08-07 ENCOUNTER — Other Ambulatory Visit: Payer: Self-pay | Admitting: Internal Medicine

## 2015-08-07 ENCOUNTER — Encounter: Payer: Self-pay | Admitting: Internal Medicine

## 2015-08-07 VITALS — BP 142/73 | HR 84 | Temp 98.4°F | Resp 14 | Wt 229.0 lb

## 2015-08-07 DIAGNOSIS — E1165 Type 2 diabetes mellitus with hyperglycemia: Secondary | ICD-10-CM

## 2015-08-07 DIAGNOSIS — E1169 Type 2 diabetes mellitus with other specified complication: Secondary | ICD-10-CM | POA: Diagnosis not present

## 2015-08-07 DIAGNOSIS — Z794 Long term (current) use of insulin: Secondary | ICD-10-CM | POA: Diagnosis not present

## 2015-08-07 DIAGNOSIS — I10 Essential (primary) hypertension: Secondary | ICD-10-CM | POA: Diagnosis not present

## 2015-08-07 DIAGNOSIS — J449 Chronic obstructive pulmonary disease, unspecified: Secondary | ICD-10-CM | POA: Diagnosis not present

## 2015-08-07 DIAGNOSIS — E785 Hyperlipidemia, unspecified: Secondary | ICD-10-CM

## 2015-08-07 DIAGNOSIS — IMO0001 Reserved for inherently not codable concepts without codable children: Secondary | ICD-10-CM

## 2015-08-07 NOTE — Progress Notes (Signed)
Date:  08/07/2015   Name:  Molly Hoover   DOB:  1946/04/19   MRN:  [id number]   Chief Complaint: Diabetes Diabetes She presents for her follow-up diabetic visit. She has type 2 diabetes mellitus. Pertinent negatives for hypoglycemia include no headaches or tremors. Pertinent negatives for diabetes include no chest pain, no fatigue, no polydipsia and no polyuria. Current diabetic treatment includes insulin injections and oral agent (dual therapy). She is compliant with treatment most of the time. Her home blood glucose trend is decreasing steadily. An ACE inhibitor/angiotensin II receptor blocker is being taken.  Hypertension This is a chronic problem. The current episode started more than 1 year ago. The problem is unchanged. The problem is controlled. Pertinent negatives include no chest pain, headaches, palpitations or shortness of breath. Past treatments include ACE inhibitors.  Hyperlipidemia This is a chronic problem. The current episode started more than 1 year ago. The problem is controlled. Associated symptoms include myalgias. Pertinent negatives include no chest pain or shortness of breath. Current antihyperlipidemic treatment includes statins and fibric acid derivatives (but nausea and burps with Lovaza).   COPD - doing well on Dulera and Spiriva.  No recent episodes or symptoms of infection.  Intermittent nausea - long standing issue possibly related to DM.  She takes reglan 5 mg tid.  Recently nausea has been worse - she thinks the Lovaza is contributing.  Lab Results  Component Value Date   HGBA1C 8.0* 04/03/2015   Lab Results  Component Value Date   CREATININE 0.66 12/24/2014     Review of Systems  Constitutional: Negative for fever, appetite change, fatigue and unexpected weight change.  HENT: Negative for tinnitus and trouble swallowing.   Eyes: Negative for visual disturbance.  Respiratory: Negative for cough, chest tightness and shortness of breath.     Cardiovascular: Negative for chest pain, palpitations and leg swelling.  Gastrointestinal: Positive for nausea. Negative for abdominal pain, diarrhea and blood in stool.  Endocrine: Negative for polydipsia and polyuria.  Genitourinary: Negative for dysuria and hematuria.  Musculoskeletal: Positive for myalgias, arthralgias and gait problem.  Neurological: Negative for tremors, numbness and headaches.  Psychiatric/Behavioral: Negative for dysphoric mood.    Patient Active Problem List   Diagnosis Date Noted  . Uncontrolled type 2 diabetes mellitus without complication, with long-term current use of insulin ([place]) 04/03/2015  . Fecal occult blood test positive 12/19/2014  . COPD, moderate ([place]) 11/19/2014  . Edema extremities 11/19/2014  . H/O gastrointestinal disease 11/19/2014  . Hyperlipidemia associated with type 2 diabetes mellitus ([place]) 11/19/2014  . Muscle spasms of head and/or neck 11/19/2014  . Idiopathic osteoporosis 11/19/2014  . 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 Venous insufficiency of leg 11/19/2014  . Morbid obesity ([place]) 10/17/2013  . Essential hypertension 10/17/2013  . Chronic cough 10/17/2013  . Chest pain on exertion 10/17/2013    Prior to Admission medications   Medication Sig Start Date End Date Taking? Authorizing Provider  alendronate (FOSAMAX) 70 MG tablet Take 70 mg by mouth once a week. Pt takes on Thursday.    Historical Provider, MD  atorvastatin (LIPITOR) 20 MG tablet TAKE 1 TABLET AT BEDTIME 05/03/15   Glean Hess, MD  azithromycin (ZITHROMAX Z-PAK) 250 MG tablet Take 2 tabs on day #1 then one a day for 4 more days. 04/03/15   Glean Hess, MD  Cranberry 500 MG CAPS Take 500 mg by mouth daily.     Historical Provider, MD  DULERA 200-5  MCG/ACT AERO Take 1 puff by mouth daily. 05/22/15   Historical Provider, MD  gemfibrozil (LOPID) 600 MG tablet Take 600 mg by mouth 2 (two) times daily.    Historical Provider, MD  gemfibrozil (LOPID) 600 MG  tablet TAKE 1 TABLET TWICE A DAY 07/14/15   Glean Hess, MD  glucose blood (ONE TOUCH ULTRA TEST) test strip Use 4 times per day to test blood sugar. 12/26/14   Glean Hess, MD  Insulin Detemir (LEVEMIR FLEXTOUCH) 100 UNIT/ML Pen Inject 22 Units into the skin daily. 01/02/15   Glean Hess, MD  Insulin Pen Needle (FIFTY50 PEN NEEDLES) 31G X 5 MM MISC 1 each by Does not apply route daily. 04/03/15   Glean Hess, MD  JANUVIA 100 MG tablet TAKE 1 TABLET DAILY 05/03/15   Glean Hess, MD  Lancets (ACCU-CHEK MULTICLIX) lancets     Historical Provider, MD  losartan (COZAAR) 25 MG tablet TAKE 1 TABLET DAILY 07/14/15   Glean Hess, MD  meloxicam [facility]) 15 MG tablet  11/29/14   Historical Provider, MD  metFORMIN (GLUCOPHAGE) 500 MG tablet TAKE 1 TABLET TWICE A DAY 02/19/15   Glean Hess, MD  metoCLOPramide (REGLAN) 5 MG tablet Take 1 tablet (5 mg total) by mouth 3 (three) times daily before meals. 12/23/14   Epifanio Lesches, MD  mometasone-formoterol (DULERA) 100-5 MCG/ACT AERO Inhale 2 puffs into the lungs 2 (two) times daily.    Historical Provider, MD  omega-3 acid ethyl esters (LOVAZA) 1 G capsule Take 1 g by mouth 2 (two) times daily.    Historical Provider, MD  omeprazole (PRILOSEC) 20 MG capsule TAKE 1 CAPSULE DAILY 08/03/15   Glean Hess, MD  SPIRIVA HANDIHALER 18 MCG inhalation capsule INHALE THE CONTENTS OF 1 CAPSULE DAILY 07/14/15   Glean Hess, MD  tiotropium (SPIRIVA) 18 MCG inhalation capsule Place 18 mcg into inhaler and inhale daily.    Historical Provider, MD  Vitamin D, Ergocalciferol, (DRISDOL) 50000 UNITS CAPS capsule Take 50,000 Units by mouth every 7 (seven) days. Pt takes on Thursday.    Historical Provider, MD    Allergies  Allergen Reactions  . Codeine Hives and Itching  . Combivent [Ipratropium-Albuterol] Itching  . Ivp Dye [Iodinated Diagnostic Agents] Hives and Itching    Past Surgical History  Procedure Laterality Date  . Cholecystectomy     . Total abdominal hysterectomy    . Cervical fusion  2002  . Lumbar fusion      L1-4   . Carpal tunnel release      right  . Hernia repair    . Breast biopsy    . Esophagogastroduodenoscopy  03/2014    benign stricture dilated    Social History  Substance Use Topics  . Smoking status: Former Smoker -- 15 years    Types: Cigarettes  . Smokeless tobacco: None  . Alcohol Use: 1.2 oz/week    2 Standard drinks or equivalent per week     Comment: occasional    Medication list has been reviewed and updated.   Physical Exam  Constitutional: She is oriented to person, place, and time. She appears well-developed. No distress.  HENT:  Head: Normocephalic and atraumatic.  Neck: Normal range of motion. Carotid bruit is not present. No thyromegaly present.  Cardiovascular: Normal rate, regular rhythm and normal heart sounds.   Pulmonary/Chest: Effort normal and breath sounds normal. No respiratory distress.  Lymphadenopathy:    She has no cervical adenopathy.  Neurological: She is alert and oriented to person, place, and time.  Foot exam - normal pulses, skin and sensation bilaterally.  Mild dystrophic nails on several toes - non tender.  Skin: Skin is warm and dry. No rash noted.  Psychiatric: She has a normal mood and affect. Her behavior is normal. Thought content normal.    BP 142/73 mmHg  Pulse 84  Temp(Src) 98.4 F (36.9 C)  Resp 14  Wt 229 lb (103.874 kg)  Assessment and Plan: 1. Uncontrolled type 2 diabetes mellitus without complication, with long-term current use of insulin (HCC) Continue current therapy - adjust insulin if needed or add a second dose - Hemoglobin A1c  2. Essential hypertension Fair control  3. COPD, moderate ([place]) Stable on Dulera and Spiriva  4. Hyperlipidemia associated with type 2 diabetes mellitus ([place]) Continue atorvastatin and lopid but discontinue Lovaza due to nausea  5. Morbid obesity, unspecified obesity type ([place]) Stable weight  - pt unable to exercise due to [place], MD [facility]  08/07/2015

## 2015-08-08 ENCOUNTER — Other Ambulatory Visit: Payer: Self-pay | Admitting: Internal Medicine

## 2015-08-08 LAB — HEMOGLOBIN A1C
ESTIMATED AVERAGE GLUCOSE: 352 mg/dL
Hgb A1c MFr Bld: 13.9 % — ABNORMAL HIGH (ref 4.8–5.6)

## 2015-08-08 MED ORDER — INSULIN ASPART 100 UNIT/ML FLEXPEN: 10.0000 [IU] | PEN_INJECTOR | Freq: Every day | SUBCUTANEOUS | Status: DC

## 2015-08-11 ENCOUNTER — Telehealth: Payer: Self-pay

## 2015-08-11 NOTE — Telephone Encounter (Signed)
Spoke with patient. Patient advised of all results and verbalized understanding. Will call back with any future questions or concerns. MAH  

## 2015-08-11 NOTE — Telephone Encounter (Signed)
-----   Message from Glean Hess, MD sent at 08/08/2015  5:38 PM EDT ----- DM is much worse.  Need to add a second short acting insulin dose of 10 units before either lunch or supper - whichever meal is usually the largest.  Continue Levemir but increase dose to 30 units in the AM.  Will follow up at visit in July. New prescription sent to [facility].

## 2015-08-18 ENCOUNTER — Other Ambulatory Visit: Payer: Self-pay | Admitting: Internal Medicine

## 2015-08-20 ENCOUNTER — Other Ambulatory Visit: Payer: Self-pay | Admitting: Internal Medicine

## 2015-10-06 DIAGNOSIS — S8002XA Contusion of left knee, initial encounter: Secondary | ICD-10-CM | POA: Insufficient documentation

## 2015-10-06 DIAGNOSIS — M19049 Primary osteoarthritis, unspecified hand: Secondary | ICD-10-CM | POA: Insufficient documentation

## 2015-10-06 HISTORY — DX: Contusion of left knee, initial encounter: S80.02XA

## 2015-10-27 ENCOUNTER — Encounter: Payer: Self-pay | Admitting: Internal Medicine

## 2015-11-01 ENCOUNTER — Other Ambulatory Visit: Payer: Self-pay | Admitting: Internal Medicine

## 2015-11-18 ENCOUNTER — Other Ambulatory Visit: Payer: Self-pay | Admitting: Internal Medicine

## 2015-12-10 ENCOUNTER — Encounter: Payer: Self-pay | Admitting: Internal Medicine

## 2015-12-10 ENCOUNTER — Ambulatory Visit
Admission: RE | Admit: 2015-12-10 | Discharge: 2015-12-10 | Disposition: A | Discharge status: Home / Self Care | Payer: Medicare Other | Source: Ambulatory Visit | Attending: Internal Medicine | Admitting: Internal Medicine

## 2015-12-10 ENCOUNTER — Ambulatory Visit (INDEPENDENT_AMBULATORY_CARE_PROVIDER_SITE_OTHER): Payer: Medicare Other | Admitting: Internal Medicine

## 2015-12-10 VITALS — BP 138/82 | HR 77 | Resp 16 | Ht 62.0 in | Wt 215.0 lb

## 2015-12-10 DIAGNOSIS — I714 Abdominal aortic aneurysm, without rupture: Secondary | ICD-10-CM | POA: Insufficient documentation

## 2015-12-10 DIAGNOSIS — M1711 Unilateral primary osteoarthritis, right knee: Secondary | ICD-10-CM | POA: Insufficient documentation

## 2015-12-10 DIAGNOSIS — M25552 Pain in left hip: Secondary | ICD-10-CM

## 2015-12-10 DIAGNOSIS — E785 Hyperlipidemia, unspecified: Secondary | ICD-10-CM

## 2015-12-10 DIAGNOSIS — Z794 Long term (current) use of insulin: Secondary | ICD-10-CM

## 2015-12-10 DIAGNOSIS — E1169 Type 2 diabetes mellitus with other specified complication: Secondary | ICD-10-CM

## 2015-12-10 DIAGNOSIS — M5116 Intervertebral disc disorders with radiculopathy, lumbar region: Secondary | ICD-10-CM

## 2015-12-10 DIAGNOSIS — I10 Essential (primary) hypertension: Secondary | ICD-10-CM

## 2015-12-10 DIAGNOSIS — M8588 Other specified disorders of bone density and structure, other site: Secondary | ICD-10-CM | POA: Insufficient documentation

## 2015-12-10 DIAGNOSIS — E1165 Type 2 diabetes mellitus with hyperglycemia: Secondary | ICD-10-CM

## 2015-12-10 DIAGNOSIS — M170 Bilateral primary osteoarthritis of knee: Secondary | ICD-10-CM

## 2015-12-10 DIAGNOSIS — IMO0001 Reserved for inherently not codable concepts without codable children: Secondary | ICD-10-CM

## 2015-12-10 MED ORDER — INSULIN PEN NEEDLE 31G X 5 MM MISC: 1.0000 | Freq: Four times a day (QID) | 3 refills | Status: DC

## 2015-12-10 MED ORDER — INSULIN ASPART 100 UNIT/ML FLEXPEN: 20.0000 [IU] | PEN_INJECTOR | Freq: Three times a day (TID) | SUBCUTANEOUS | 11 refills | Status: DC

## 2015-12-10 MED ORDER — GABAPENTIN 300 MG PO CAPS: 300.0000 mg | ORAL_CAPSULE | Freq: Every day | ORAL | 3 refills | Status: DC

## 2015-12-10 MED ORDER — INSULIN DETEMIR 100 UNIT/ML FLEXPEN: 30.0000 [IU] | PEN_INJECTOR | Freq: Every day | SUBCUTANEOUS | 11 refills | Status: DC

## 2015-12-10 NOTE — Addendum Note (Signed)
Addended by: Theresia Majors A on: 12/10/2015 10:19 AM   Modules accepted: Orders

## 2015-12-10 NOTE — Progress Notes (Signed)
Date:  12/10/2015   Name:  Molly Hoover   DOB:  [birth year]   MRN:  [id number]   Chief Complaint: Arthritis (Wants to try Gel for knee. Does not agree with Ortho as nothing is helping knee. She tried Meloxicam, injection, Narcotics do not even help. She says she is afraid it could be from lower back surgery or neck surgeries in past but feels way diff than her arthritis in hand and other parts of body. Deseperate for a cause and cure even surgery. Has ONLY had Xrays. ); Hypertension; and Hyperlipidemia  Review of x-rays from [facility] reveal no significant arthritis in either knee. She describes a burning pain that comes from her left hip down the lateral thigh to the knee and lateral lower leg. It is worse with standing and walking. Sometimes feels as if it might give way. There has been no benefit from Mobic or narcotic pain medications.  She has a history of lumbar fusion. Has never taken gabapentin and Lyrica or Cymbalta.   Arthritis  Presents for follow-up visit. She complains of pain and stiffness. The symptoms have been worsening. Affected locations include the right knee and left knee (left worse than right). Pertinent negatives include no diarrhea, dysuria, fatigue, fever or rash.  Diabetes  She presents for her follow-up diabetic visit. She has type 2 diabetes mellitus. Her disease course has been worsening. Pertinent negatives for hypoglycemia include no headaches or tremors. Pertinent negatives for diabetes include no blurred vision, no chest pain, no fatigue, no foot paresthesias, no polydipsia and no polyuria. Symptoms are worsening. Current diabetic treatment includes intensive insulin program (30 units levemir; 15 units novolog). When asked about meal planning, she reported none. Her breakfast blood glucose is taken between 7-8 am. Her breakfast blood glucose range is generally >200 mg/dl.  Hypertension  This is a chronic problem. The current episode started more  than 1 year ago. The problem is unchanged. Pertinent negatives include no blurred vision, chest pain, headaches, palpitations or shortness of breath. Identifiable causes of hypertension include chronic renal disease.  Hyperlipidemia  This is a chronic problem. The current episode started more than 1 year ago. The problem is resistant. Exacerbating diseases include chronic renal disease. Associated symptoms include myalgias. Pertinent negatives include no chest pain or shortness of breath. Current antihyperlipidemic treatment includes statins.   Lab Results  Component Value Date   HGBA1C 13.9 (H) 08/07/2015   Lab Results  Component Value Date   CREATININE 0.66 12/24/2014      Review of Systems  Constitutional: Negative for appetite change, fatigue, fever and unexpected weight change.  HENT: Negative for tinnitus and trouble swallowing.   Eyes: Negative for blurred vision and visual disturbance.  Respiratory: Negative for cough, chest tightness and shortness of breath.   Cardiovascular: Negative for chest pain, palpitations and leg swelling.  Gastrointestinal: Negative for abdominal pain, blood in stool and diarrhea.  Endocrine: Negative for polydipsia and polyuria.  Genitourinary: Negative for dysuria and hematuria.  Musculoskeletal: Positive for arthralgias, arthritis, gait problem, myalgias and stiffness.  Skin: Negative for color change and rash.  Neurological: Negative for tremors, numbness and headaches.  Psychiatric/Behavioral: Negative for dysphoric mood and sleep disturbance.    Patient Active Problem List   Diagnosis Date Noted  . Osteoarthritis of both knees 12/10/2015  . Uncontrolled type 2 diabetes mellitus without complication, with long-term current use of insulin ([place]) 04/03/2015  . Fecal occult blood test positive 12/19/2014  . COPD, moderate ([place])  11/19/2014  . Edema extremities 11/19/2014  . H/O gastrointestinal disease 11/19/2014  . Hyperlipidemia associated  with type 2 diabetes mellitus ([place]) 11/19/2014  . Muscle spasms of head and/or neck 11/19/2014  . Idiopathic osteoporosis 11/19/2014  . 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 Venous insufficiency of leg 11/19/2014  . Morbid obesity ([place]) 10/17/2013  . Essential hypertension 10/17/2013  . Chronic cough 10/17/2013  . Chest pain on exertion 10/17/2013    Prior to Admission medications   Medication Sig Start Date End Date Taking? Authorizing Provider  alendronate (FOSAMAX) 70 MG tablet TAKE 1 TABLET EVERY WEEK 08/20/15  Yes Glean Hess, MD  atorvastatin (LIPITOR) 20 MG tablet TAKE 1 TABLET AT BEDTIME 05/03/15  Yes Glean Hess, MD  Cranberry 500 MG CAPS Take 500 mg by mouth daily.    Yes Historical Provider, MD  DULERA 200-5 MCG/ACT AERO USE 1 INHALATION TWICE A DAY 08/18/15  Yes Glean Hess, MD  gemfibrozil (LOPID) 600 MG tablet Take 600 mg by mouth 2 (two) times daily.   Yes Historical Provider, MD  glucose blood (ONE TOUCH ULTRA TEST) test strip Use 4 times per day to test blood sugar. 12/26/14  Yes Glean Hess, MD  insulin aspart (NOVOLOG FLEXPEN) 100 UNIT/ML FlexPen Inject 10 Units into the skin daily before supper. 08/08/15  Yes Glean Hess, MD  Insulin Detemir (LEVEMIR FLEXTOUCH) 100 UNIT/ML Pen Inject 22 Units into the skin daily. 01/02/15  Yes Glean Hess, MD  Insulin Pen Needle (FIFTY50 PEN NEEDLES) 31G X 5 MM MISC 1 each by Does not apply route daily. 04/03/15  Yes Glean Hess, MD  JANUVIA 100 MG tablet TAKE 1 TABLET DAILY 05/03/15  Yes Glean Hess, MD  Lancets ([place]) lancets    Yes Historical Provider, MD  losartan (COZAAR) 25 MG tablet TAKE 1 TABLET DAILY 07/14/15  Yes Glean Hess, MD  meloxicam [facility]) 15 MG tablet  11/29/14  Yes Historical Provider, MD  metFORMIN (GLUCOPHAGE) 500 MG tablet TAKE 1 TABLET TWICE A DAY 11/18/15  Yes Glean Hess, MD  omeprazole (PRILOSEC) 20 MG capsule TAKE 1 CAPSULE DAILY 11/01/15  Yes Glean Hess, MD  SPIRIVA HANDIHALER 18 MCG inhalation capsule INHALE THE CONTENTS OF 1 CAPSULE DAILY 07/14/15  Yes Glean Hess, MD  Vitamin D, Ergocalciferol, (DRISDOL) 50000 units CAPS capsule TAKE 1 CAPSULE TWICE WEEKLY 08/20/15  Yes Glean Hess, MD    Allergies  Allergen Reactions  . Codeine Hives and Itching  . Combivent [Ipratropium-Albuterol] Itching  . Ivp Dye [Iodinated Diagnostic Agents] Hives and Itching    Past Surgical History:  Procedure Laterality Date  . BREAST BIOPSY    . CARPAL TUNNEL RELEASE     right  . CERVICAL FUSION  2002  . CHOLECYSTECTOMY    . ESOPHAGOGASTRODUODENOSCOPY  03/2014   benign stricture dilated  . HERNIA REPAIR    . LUMBAR FUSION     L1-4   . TOTAL ABDOMINAL HYSTERECTOMY      Social History  Substance Use Topics  . Smoking status: Former Smoker    Years: 15.00    Types: Cigarettes  . Smokeless tobacco: Never Used  . Alcohol use 1.2 oz/week    2 Standard drinks or equivalent per week     Comment: occasional     Medication list has been reviewed and updated.   Physical Exam  Constitutional: She is oriented to person, place, and time. She appears well-developed.  No distress.  HENT:  Head: Normocephalic and atraumatic.  Neck: Normal range of motion. Carotid bruit is not present. No thyromegaly present.  Cardiovascular: Normal rate, regular rhythm and normal heart sounds.   Pulmonary/Chest: Effort normal and breath sounds normal. No respiratory distress.  Musculoskeletal:       Left hip: She exhibits decreased range of motion. She exhibits no tenderness and no bony tenderness.       Right knee: She exhibits decreased range of motion. She exhibits no effusion. Tenderness found.       Left knee: She exhibits decreased range of motion. She exhibits no effusion. Tenderness found.  Lymphadenopathy:    She has no cervical adenopathy.  Neurological: She is alert and oriented to person, place, and time.  Skin: Skin is warm and dry. No  rash noted.  Psychiatric: Her speech is normal and behavior is normal. Thought content normal. She exhibits a depressed mood.    BP 138/82 (BP Location: Right Arm, Patient Position: Sitting, Cuff Size: Normal)   Pulse 77   Resp 16   Ht 5\' 2"  (1.575 m)   Wt 215 lb (97.5 kg)   SpO2 98%   BMI 39.32 kg/m   Assessment and Plan: 1. Uncontrolled type 2 diabetes mellitus without complication, with long-term current use of insulin (HCC) Very poorly controlled and likely contributing to neuropathic type leg and knee pain Increase Novolog to [facility] Patient to call in 2 weeks to report BS and for titration instructions - Hemoglobin A1c - Comprehensive metabolic panel - Insulin Detemir (LEVEMIR FLEXTOUCH) 100 UNIT/ML Pen; Inject 30 Units into the skin daily.  Dispense: 15 mL; Refill: 11 - insulin aspart (NOVOLOG FLEXPEN) 100 UNIT/ML FlexPen; Inject 20 Units into the skin 3 (three) times daily with meals.  Dispense: 15 mL; Refill: 11 - Insulin Pen Needle (FIFTY50 PEN NEEDLES) 31G X 5 MM MISC; 1 each by Does not apply route 4 (four) times daily.  Dispense: 400 each; Refill: 3  2. Essential hypertension controlled  3. Hyperlipidemia associated with type 2 diabetes mellitus ([place]) On statin therapy - Lipid panel  4. Lumbar disc disease with radiculopathy Begin gabapentin - increase as tolerated  Patient to call in 2 weeks to report - gabapentin (NEURONTIN) 300 MG capsule; Take 1 capsule (300 mg total) by mouth at bedtime.  Dispense: 30 capsule; Refill: 3 - DG Lumbar Spine Complete; Future  5. Hip pain, left - DG HIP UNILAT WITH PELVIS MIN 4 VIEWS LEFT; Future   Halina Maidens, MD [facility]  12/10/2015

## 2015-12-10 NOTE — Patient Instructions (Signed)
CALL IN 2 WEEKS WITH blood sugar reading for instructions on changing the dose  Call regarding Gabapentin - the dose can be increased if helpful.

## 2015-12-11 ENCOUNTER — Other Ambulatory Visit: Payer: Self-pay | Admitting: Internal Medicine

## 2015-12-11 ENCOUNTER — Encounter: Payer: Self-pay | Admitting: Internal Medicine

## 2015-12-11 DIAGNOSIS — I714 Abdominal aortic aneurysm, without rupture, unspecified: Secondary | ICD-10-CM

## 2015-12-11 DIAGNOSIS — I77811 Abdominal aortic ectasia: Secondary | ICD-10-CM | POA: Insufficient documentation

## 2015-12-11 LAB — LIPID PANEL
CHOL/HDL RATIO: 3.7 ratio (ref 0.0–4.4)
Cholesterol, Total: 184 mg/dL (ref 100–199)
HDL: 50 mg/dL (ref >39)
LDL Calculated: 108 mg/dL — ABNORMAL HIGH (ref 0–99)
Triglycerides: 131 mg/dL (ref 0–149)
VLDL Cholesterol Cal: 26 mg/dL (ref 5–40)

## 2015-12-11 LAB — COMPREHENSIVE METABOLIC PANEL
A/G RATIO: 2 (ref 1.2–2.2)
ALT: 23 IU/L (ref 0–32)
AST: 19 IU/L (ref 0–40)
Albumin: 4.5 g/dL (ref 3.5–4.8)
Alkaline Phosphatase: 65 IU/L (ref 39–117)
BUN/Creatinine Ratio: 19 (ref 12–28)
BUN: 16 mg/dL (ref 8–27)
Bilirubin Total: 0.9 mg/dL (ref 0.0–1.2)
CALCIUM: 9.9 mg/dL (ref 8.7–10.3)
CO2: 24 mmol/L (ref 18–29)
Chloride: 95 mmol/L — ABNORMAL LOW (ref 96–106)
Creatinine, Ser: 0.84 mg/dL (ref 0.57–1.00)
GFR, EST AFRICAN AMERICAN: 81 mL/min/{1.73_m2} (ref >59)
GFR, EST NON AFRICAN AMERICAN: 71 mL/min/{1.73_m2} (ref >59)
GLOBULIN, TOTAL: 2.3 g/dL (ref 1.5–4.5)
Glucose: 399 mg/dL — ABNORMAL HIGH (ref 65–99)
POTASSIUM: 5.2 mmol/L (ref 3.5–5.2)
SODIUM: 138 mmol/L (ref 134–144)
Total Protein: 6.8 g/dL (ref 6.0–8.5)

## 2015-12-11 LAB — HEMOGLOBIN A1C
Est. average glucose Bld gHb Est-mCnc: 372 mg/dL
HEMOGLOBIN A1C: 14.6 % — AB (ref 4.8–5.6)

## 2015-12-15 ENCOUNTER — Encounter: Payer: Self-pay | Admitting: Internal Medicine

## 2015-12-15 ENCOUNTER — Other Ambulatory Visit: Payer: Self-pay | Admitting: Internal Medicine

## 2015-12-15 ENCOUNTER — Ambulatory Visit
Admission: RE | Admit: 2015-12-15 | Discharge: 2015-12-15 | Disposition: A | Discharge status: Home / Self Care | Payer: Medicare Other | Source: Ambulatory Visit | Attending: Internal Medicine | Admitting: Internal Medicine

## 2015-12-15 DIAGNOSIS — I723 Aneurysm of iliac artery: Secondary | ICD-10-CM

## 2015-12-15 DIAGNOSIS — I714 Abdominal aortic aneurysm, without rupture, unspecified: Secondary | ICD-10-CM

## 2015-12-15 DIAGNOSIS — I77811 Abdominal aortic ectasia: Secondary | ICD-10-CM | POA: Insufficient documentation

## 2015-12-15 HISTORY — DX: Aneurysm of iliac artery: I72.3

## 2015-12-22 ENCOUNTER — Ambulatory Visit (INDEPENDENT_AMBULATORY_CARE_PROVIDER_SITE_OTHER): Payer: Medicare Other | Admitting: Internal Medicine

## 2015-12-22 ENCOUNTER — Encounter: Payer: Self-pay | Admitting: Internal Medicine

## 2015-12-22 VITALS — BP 140/80 | HR 74 | Resp 16 | Ht 62.0 in | Wt 213.0 lb

## 2015-12-22 DIAGNOSIS — Z794 Long term (current) use of insulin: Secondary | ICD-10-CM | POA: Diagnosis not present

## 2015-12-22 DIAGNOSIS — E1165 Type 2 diabetes mellitus with hyperglycemia: Secondary | ICD-10-CM

## 2015-12-22 DIAGNOSIS — M170 Bilateral primary osteoarthritis of knee: Secondary | ICD-10-CM

## 2015-12-22 DIAGNOSIS — IMO0001 Reserved for inherently not codable concepts without codable children: Secondary | ICD-10-CM

## 2015-12-22 DIAGNOSIS — R21 Rash and other nonspecific skin eruption: Secondary | ICD-10-CM

## 2015-12-22 DIAGNOSIS — L0889 Other specified local infections of the skin and subcutaneous tissue: Secondary | ICD-10-CM | POA: Diagnosis not present

## 2015-12-22 DIAGNOSIS — L089 Local infection of the skin and subcutaneous tissue, unspecified: Secondary | ICD-10-CM

## 2015-12-22 MED ORDER — ONETOUCH ULTRA SYSTEM W/DEVICE KIT: 1.0000 | PACK | Freq: Once | 0 refills | Status: AC

## 2015-12-22 NOTE — Progress Notes (Signed)
Date:  12/22/2015   Name:  Molly Hoover   DOB:  1945-06-11   MRN:  [id number]   Chief Complaint: blisters (R and L arms- has Dermatologist and saw few weeks ago but for different issues. Blisters all over and one looks like it is ready to pop. ); Knee Pain (Wants RX for walker with wheels and a lift chair in livingroom as well as an Rx for shower chair with back support. Pain in L hip constant and knee pain. Wants ref to [facility]. ); and Diabetes (BS [phone])  Knee Pain   There was no injury mechanism. The pain is present in the left knee and right knee. The quality of the pain is described as stabbing and aching. The pain is moderate. The pain has been worsening since onset. Associated symptoms include an inability to bear weight and muscle weakness. Pertinent negatives include no numbness. Associated symptoms comments: Needs Rollator for safety and shower chair for the same reason.  Would also like a lift chair but may not qualify.. The symptoms are aggravated by weight bearing. She has tried immobilization and NSAIDs (she is afraid of steroid injections so has never had one.  Would rather go straight to surgery) for the symptoms.  Diabetes  She presents for her follow-up diabetic visit. She has type 2 diabetes mellitus. Her disease course has been improving (since increasing insulin dosing). Pertinent negatives for hypoglycemia include no headaches or tremors. Pertinent negatives for diabetes include no chest pain, no fatigue, no polydipsia and no polyuria.  Rash - blistering rash on both arms over the past week.  Slightly itchy, not painful.  On no new medications.  Has Dermatologist but has not seen her. Pustule - in right axilla.  Somewhat painful, not draining.  No known injury.  Lab Results  Component Value Date   HGBA1C 14.6 (H) 12/10/2015   Wt Readings from Last 3 Encounters:  12/22/15 213 lb (96.6 kg)  12/10/15 215 lb (97.5 kg)  08/07/15 229 lb (103.9 kg)     Review of  Systems  Constitutional: Negative for appetite change, fatigue, fever and unexpected weight change.  HENT: Negative for tinnitus and trouble swallowing.   Eyes: Negative for visual disturbance.  Respiratory: Negative for cough, chest tightness and shortness of breath.   Cardiovascular: Negative for chest pain, palpitations and leg swelling.  Gastrointestinal: Negative for abdominal pain, blood in stool and diarrhea.  Endocrine: Negative for polydipsia and polyuria.  Musculoskeletal: Positive for arthralgias, gait problem and myalgias.  Skin: Positive for rash. Negative for color change.  Neurological: Negative for tremors, numbness and headaches.  Psychiatric/Behavioral: Positive for dysphoric mood. Negative for sleep disturbance.    Patient Active Problem List   Diagnosis Date Noted  . Iliac artery aneurysm, bilateral ([place]) 12/15/2015  . Abdominal aortic aneurysm (AAA) without rupture ([place]) 12/11/2015  . Osteoarthritis of both knees 12/10/2015  . Uncontrolled type 2 diabetes mellitus without complication, with long-term current use of insulin ([place]) 04/03/2015  . Fecal occult blood test positive 12/19/2014  . COPD, moderate ([place]) 11/19/2014  . Edema extremities 11/19/2014  . H/O gastrointestinal disease 11/19/2014  . Hyperlipidemia associated with type 2 diabetes mellitus ([place]) 11/19/2014  . Muscle spasms of head and/or neck 11/19/2014  . Idiopathic osteoporosis 11/19/2014  . 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 Venous insufficiency of leg 11/19/2014  . Morbid obesity ([place]) 10/17/2013  . Essential hypertension 10/17/2013  . Chronic cough 10/17/2013  . Chest pain on exertion 10/17/2013  Prior to Admission medications   Medication Sig Start Date End Date Taking? Authorizing Provider  alendronate (FOSAMAX) 70 MG tablet TAKE 1 TABLET EVERY WEEK 08/20/15  Yes Glean Hess, MD  atorvastatin (LIPITOR) 20 MG tablet TAKE 1 TABLET AT BEDTIME 05/03/15  Yes Glean Hess, MD    Cranberry 500 MG CAPS Take 500 mg by mouth daily.    Yes Historical Provider, MD  DULERA 200-5 MCG/ACT AERO USE 1 INHALATION TWICE A DAY 08/18/15  Yes Glean Hess, MD  gabapentin (NEURONTIN) 300 MG capsule Take 1 capsule (300 mg total) by mouth at bedtime. 12/10/15  Yes Glean Hess, MD  gemfibrozil (LOPID) 600 MG tablet Take 600 mg by mouth 2 (two) times daily.   Yes Historical Provider, MD  glucose blood (ONE TOUCH ULTRA TEST) test strip Use 4 times per day to test blood sugar. 12/26/14  Yes Glean Hess, MD  insulin aspart (NOVOLOG FLEXPEN) 100 UNIT/ML FlexPen Inject 20 Units into the skin 3 (three) times daily with meals. 12/10/15  Yes Glean Hess, MD  Insulin Detemir (LEVEMIR FLEXTOUCH) 100 UNIT/ML Pen Inject 30 Units into the skin daily. 12/10/15  Yes Glean Hess, MD  Insulin Pen Needle (FIFTY50 PEN NEEDLES) 31G X 5 MM MISC 1 each by Does not apply route 4 (four) times daily. 12/10/15  Yes Glean Hess, MD  JANUVIA 100 MG tablet TAKE 1 TABLET DAILY 05/03/15  Yes Glean Hess, MD  Lancets ([place]) lancets    Yes Historical Provider, MD  losartan (COZAAR) 25 MG tablet TAKE 1 TABLET DAILY 07/14/15  Yes Glean Hess, MD  meloxicam [facility]) 15 MG tablet  11/29/14  Yes Historical Provider, MD  metFORMIN (GLUCOPHAGE) 500 MG tablet TAKE 1 TABLET TWICE A DAY 11/18/15  Yes Glean Hess, MD  omeprazole (PRILOSEC) 20 MG capsule TAKE 1 CAPSULE DAILY 11/01/15  Yes Glean Hess, MD  SPIRIVA HANDIHALER 18 MCG inhalation capsule INHALE THE CONTENTS OF 1 CAPSULE DAILY 07/14/15  Yes Glean Hess, MD  Vitamin D, Ergocalciferol, (DRISDOL) 50000 units CAPS capsule TAKE 1 CAPSULE TWICE WEEKLY 08/20/15  Yes Glean Hess, MD    Allergies  Allergen Reactions  . Codeine Hives and Itching  . Combivent [Ipratropium-Albuterol] Itching  . Ivp Dye [Iodinated Diagnostic Agents] Hives and Itching    Past Surgical History:  Procedure Laterality Date  . BREAST BIOPSY     . CARPAL TUNNEL RELEASE     right  . CERVICAL FUSION  2002  . CHOLECYSTECTOMY    . ESOPHAGOGASTRODUODENOSCOPY  03/2014   benign stricture dilated  . HERNIA REPAIR    . LUMBAR FUSION     L1-4   . TOTAL ABDOMINAL HYSTERECTOMY      Social History  Substance Use Topics  . Smoking status: Former Smoker    Years: 15.00    Types: Cigarettes  . Smokeless tobacco: Never Used  . Alcohol use 1.2 oz/week    2 Standard drinks or equivalent per week     Comment: occasional     Medication list has been reviewed and updated.   Physical Exam  Constitutional: She is oriented to person, place, and time. She appears well-developed. No distress.  HENT:  Head: Normocephalic and atraumatic.  Cardiovascular: Normal rate, regular rhythm and normal heart sounds.   Pulmonary/Chest: Effort normal and breath sounds normal. No respiratory distress.  Musculoskeletal:       Right knee: She exhibits decreased range of motion  and swelling. She exhibits no effusion.       Left knee: She exhibits decreased range of motion and swelling. She exhibits no effusion.  Neurological: She is alert and oriented to person, place, and time.  Skin: Skin is warm and dry. No rash noted.  maculo papular rash over both arms - c/w photosensitivity reaction.  Pustule in right axilla - small amount of bloody purulent material expressed  Psychiatric: Her speech is normal and behavior is normal. Thought content normal. She exhibits a depressed mood.  Nursing note and vitals reviewed.   BP 140/80 (BP Location: Right Arm, Patient Position: Sitting, Cuff Size: Large)   Pulse 74   Resp 16   Ht 5' 2"  (1.575 m)   Wt 213 lb (96.6 kg)   SpO2 97%   BMI 38.96 kg/m   Assessment and Plan: 1. Primary osteoarthritis of both knees Strongly urge patient to consider trial of cortisone injections RXs written for Rollator, Shower chair with back rest and Lift chair. Refer to [facility] - Dr. Calton Dach - Ambulatory referral to  Orthopedic Surgery  2. Uncontrolled type 2 diabetes mellitus without complication, with long-term current use of insulin ([place]) Improving with aggressive insulin therapy - Blood Glucose Monitoring Suppl (ONE TOUCH ULTRA SYSTEM KIT) w/Device KIT; 1 kit by Does not apply route once.  Dispense: 1 each; Refill: 0  3. Rash Follow up with Dermatology  4. Skin pustule Keep area clean and apply TAO as needed  Halina Maidens, MD [facility]  12/22/2015

## 2016-01-13 ENCOUNTER — Other Ambulatory Visit: Payer: Self-pay | Admitting: Internal Medicine

## 2016-01-21 ENCOUNTER — Other Ambulatory Visit: Payer: Self-pay | Admitting: Internal Medicine

## 2016-01-21 DIAGNOSIS — IMO0001 Reserved for inherently not codable concepts without codable children: Secondary | ICD-10-CM

## 2016-01-21 DIAGNOSIS — Z794 Long term (current) use of insulin: Principal | ICD-10-CM

## 2016-01-21 DIAGNOSIS — E1165 Type 2 diabetes mellitus with hyperglycemia: Principal | ICD-10-CM

## 2016-01-21 MED ORDER — GLUCOSE BLOOD VI STRP: ORAL_STRIP | 12 refills | Status: DC

## 2016-01-21 MED ORDER — INSULIN PEN NEEDLE 31G X 5 MM MISC: 1.0000 | Freq: Four times a day (QID) | 3 refills | Status: DC

## 2016-01-30 ENCOUNTER — Other Ambulatory Visit: Payer: Self-pay | Admitting: Unknown Physician Specialty

## 2016-01-30 DIAGNOSIS — M1712 Unilateral primary osteoarthritis, left knee: Secondary | ICD-10-CM

## 2016-02-16 ENCOUNTER — Ambulatory Visit
Admission: RE | Admit: 2016-02-16 | Discharge: 2016-02-16 | Disposition: A | Discharge status: Home / Self Care | Payer: Medicare Other | Source: Ambulatory Visit | Attending: Unknown Physician Specialty | Admitting: Unknown Physician Specialty

## 2016-02-16 DIAGNOSIS — S83272A Complex tear of lateral meniscus, current injury, left knee, initial encounter: Secondary | ICD-10-CM | POA: Insufficient documentation

## 2016-02-16 DIAGNOSIS — S83242A Other tear of medial meniscus, current injury, left knee, initial encounter: Secondary | ICD-10-CM | POA: Insufficient documentation

## 2016-02-16 DIAGNOSIS — M25462 Effusion, left knee: Secondary | ICD-10-CM | POA: Insufficient documentation

## 2016-02-16 DIAGNOSIS — M1712 Unilateral primary osteoarthritis, left knee: Secondary | ICD-10-CM | POA: Diagnosis not present

## 2016-02-16 DIAGNOSIS — X58XXXA Exposure to other specified factors, initial encounter: Secondary | ICD-10-CM | POA: Diagnosis not present

## 2016-02-25 ENCOUNTER — Telehealth: Payer: Self-pay

## 2016-02-25 ENCOUNTER — Encounter: Payer: Self-pay | Admitting: Internal Medicine

## 2016-02-25 NOTE — Telephone Encounter (Signed)
He has not sent me any notes but I can see the MRI report.

## 2016-02-25 NOTE — Telephone Encounter (Signed)
Have you gotten any notes from Dr. Jefm Bryant about patient getting knee replacement?

## 2016-03-01 ENCOUNTER — Encounter: Payer: Self-pay | Admitting: Internal Medicine

## 2016-03-01 ENCOUNTER — Ambulatory Visit (INDEPENDENT_AMBULATORY_CARE_PROVIDER_SITE_OTHER): Payer: Medicare Other | Admitting: Internal Medicine

## 2016-03-01 VITALS — BP 117/78 | HR 74 | Resp 16 | Ht 62.0 in | Wt 237.0 lb

## 2016-03-01 DIAGNOSIS — M170 Bilateral primary osteoarthritis of knee: Secondary | ICD-10-CM

## 2016-03-01 DIAGNOSIS — Z794 Long term (current) use of insulin: Secondary | ICD-10-CM

## 2016-03-01 DIAGNOSIS — E1165 Type 2 diabetes mellitus with hyperglycemia: Secondary | ICD-10-CM

## 2016-03-01 DIAGNOSIS — IMO0001 Reserved for inherently not codable concepts without codable children: Secondary | ICD-10-CM

## 2016-03-01 MED ORDER — INSULIN DETEMIR 100 UNIT/ML FLEXPEN: 30.0000 [IU] | PEN_INJECTOR | Freq: Two times a day (BID) | SUBCUTANEOUS | 11 refills | Status: DC

## 2016-03-01 MED ORDER — INSULIN ASPART 100 UNIT/ML FLEXPEN: 30.0000 [IU] | PEN_INJECTOR | Freq: Three times a day (TID) | SUBCUTANEOUS | 11 refills | Status: DC

## 2016-03-01 NOTE — Progress Notes (Signed)
Date:  03/01/2016   Name:  Molly Hoover   DOB:  01-31-1946   MRN:  [id number]   Chief Complaint: Medical Clearance (Denied L knee replacement due to A1C)  Diabetes  She presents for her follow-up diabetic visit. She has type 2 diabetes mellitus. Her disease course has been worsening. Pertinent negatives for diabetes include no chest pain and no fatigue.  She gets some low readings around 70 but other readings are 130-140.  She tends to eat 2 meals a day and snacks like graham crackers with peanut butter. She also consumes carbs such as cornbread regularly. Her dose is Levemir 30 units once a day; Novolog 30 units with meals (2 times per day) and 8 units with snacks.  Her weight is up.  She is very depressed today that she can not get surgery yet.  I explained carefully to her and her husband that high blood sugars increase surgical complications such as wound infections and poor wound healing.   I let them know that it will be at least 8 weeks before significant improvement in A1C can be expected.  We discussed diet and the need to eliminate almost all carbs to help get sugars under control.   Review of Systems  Constitutional: Positive for unexpected weight change. Negative for chills and fatigue.  Respiratory: Negative for chest tightness, shortness of breath and wheezing.   Cardiovascular: Negative for chest pain and palpitations.  Genitourinary: Negative for dysuria.  Musculoskeletal: Positive for arthralgias and gait problem.    Patient Active Problem List   Diagnosis Date Noted  . Iliac artery aneurysm, bilateral ([place]) 12/15/2015  . Abdominal aortic aneurysm (AAA) without rupture ([place]) 12/11/2015  . Osteoarthritis of both knees 12/10/2015  . Uncontrolled type 2 diabetes mellitus without complication, with long-term current use of insulin ([place]) 04/03/2015  . Fecal occult blood test positive 12/19/2014  . COPD, moderate ([place]) 11/19/2014  . Edema extremities 11/19/2014    . H/O gastrointestinal disease 11/19/2014  . Hyperlipidemia associated with type 2 diabetes mellitus ([place]) 11/19/2014  . Muscle spasms of head and/or neck 11/19/2014  . Idiopathic osteoporosis 11/19/2014  . 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 Venous insufficiency of leg 11/19/2014  . Morbid obesity ([place]) 10/17/2013  . Essential hypertension 10/17/2013  . Chronic cough 10/17/2013  . Chest pain on exertion 10/17/2013    Prior to Admission medications   Medication Sig Start Date End Date Taking? Authorizing Provider  alendronate (FOSAMAX) 70 MG tablet TAKE 1 TABLET EVERY WEEK 08/20/15  Yes Glean Hess, MD  atorvastatin (LIPITOR) 20 MG tablet TAKE 1 TABLET AT BEDTIME 05/03/15  Yes Glean Hess, MD  Cranberry 500 MG CAPS Take 500 mg by mouth daily.    Yes Historical Provider, MD  diclofenac (VOLTAREN) 75 MG EC tablet  11/03/15  Yes Historical Provider, MD  DULERA 200-5 MCG/ACT AERO USE 1 INHALATION TWICE A DAY 08/18/15  Yes Glean Hess, MD  gabapentin (NEURONTIN) 300 MG capsule Take 1 capsule (300 mg total) by mouth at bedtime. 12/10/15  Yes Glean Hess, MD  gemfibrozil (LOPID) 600 MG tablet TAKE 1 TABLET TWICE A DAY 01/13/16  Yes Glean Hess, MD  glucose blood (ONE TOUCH ULTRA TEST) test strip Use 4 times per day to test blood sugar. 01/21/16  Yes Glean Hess, MD  insulin aspart (NOVOLOG FLEXPEN) 100 UNIT/ML FlexPen Inject 20 Units into the skin 3 (three) times daily with meals. 12/10/15  Yes Jesse Sans  Army Melia, MD  Insulin Detemir (LEVEMIR FLEXTOUCH) 100 UNIT/ML Pen Inject 30 Units into the skin daily. 12/10/15  Yes Glean Hess, MD  Insulin Pen Needle (FIFTY50 PEN NEEDLES) 31G X 5 MM MISC 1 each by Does not apply route 4 (four) times daily. 01/21/16  Yes Glean Hess, MD  JANUVIA 100 MG tablet TAKE 1 TABLET DAILY 05/03/15  Yes Glean Hess, MD  Lancets ([place]) lancets    Yes Historical Provider, MD  losartan (COZAAR) 25 MG tablet TAKE 1 TABLET  DAILY 01/13/16  Yes Glean Hess, MD  meloxicam [facility]) 15 MG tablet  11/29/14  Yes Historical Provider, MD  metFORMIN (GLUCOPHAGE) 500 MG tablet TAKE 1 TABLET TWICE A DAY 11/18/15  Yes Glean Hess, MD  omeprazole (PRILOSEC) 20 MG capsule TAKE 1 CAPSULE DAILY 11/01/15  Yes Glean Hess, MD  SPIRIVA HANDIHALER 18 MCG inhalation capsule INHALE THE CONTENTS OF 1 CAPSULE DAILY 01/13/16  Yes Glean Hess, MD  Vitamin D, Ergocalciferol, (DRISDOL) 50000 units CAPS capsule TAKE 1 CAPSULE TWICE WEEKLY 08/20/15  Yes Glean Hess, MD    Allergies  Allergen Reactions  . Codeine Hives and Itching  . Combivent [Ipratropium-Albuterol] Itching  . Ivp Dye [Iodinated Diagnostic Agents] Hives and Itching    Past Surgical History:  Procedure Laterality Date  . BREAST BIOPSY    . CARPAL TUNNEL RELEASE     right  . CERVICAL FUSION  2002  . CHOLECYSTECTOMY    . ESOPHAGOGASTRODUODENOSCOPY  03/2014   benign stricture dilated  . HERNIA REPAIR    . LUMBAR FUSION     L1-4   . TOTAL ABDOMINAL HYSTERECTOMY      Social History  Substance Use Topics  . Smoking status: Former Smoker    Years: 15.00    Types: Cigarettes  . Smokeless tobacco: Never Used  . Alcohol use 1.2 oz/week    2 Standard drinks or equivalent per week     Comment: occasional     Medication list has been reviewed and updated.   Physical Exam  Constitutional: She is oriented to person, place, and time. She appears well-developed. No distress.  HENT:  Head: Normocephalic and atraumatic.  Neck: Normal range of motion.  Cardiovascular: Normal rate, regular rhythm and normal heart sounds.   Pulmonary/Chest: Effort normal and breath sounds normal. No respiratory distress.  Lymphadenopathy:    She has no cervical adenopathy.  Neurological: She is alert and oriented to person, place, and time.  Skin: Skin is warm and dry. No rash noted.  Psychiatric: She has a normal mood and affect. Her behavior is normal. Thought  content normal.  Nursing note and vitals reviewed.   BP 117/78   Pulse 74   Resp 16   Ht 5' 2"  (1.575 m)   Wt 237 lb (107.5 kg)   SpO2 99%   BMI 43.35 kg/m   Assessment and Plan: 1. Primary osteoarthritis of both knees Needs total knee replacement on left  I recommend A1C < 8 before proceeding  2. Uncontrolled type 2 diabetes mellitus without complication, with long-term current use of insulin ([place]) I decided to double Levemir to 30 units BID Continue Novolog qAC Strict low carb diet until goal met Will advise on 6 or 8 week follow up when labs received - Hemoglobin A1c - Renal function panel - Insulin Detemir (LEVEMIR FLEXTOUCH) 100 UNIT/ML Pen; Inject 30 Units into the skin 2 (two) times daily.  Dispense: 30 mL; Refill: 11 -  insulin aspart (NOVOLOG FLEXPEN) 100 UNIT/ML FlexPen; Inject 30 Units into the skin 3 (three) times daily with meals.  Dispense: 30 mL; Refill: [place], MD [facility]  03/01/2016

## 2016-03-02 LAB — RENAL FUNCTION PANEL
Albumin: 4.7 g/dL (ref 3.5–4.8)
BUN / CREAT RATIO: 24 (ref 12–28)
BUN: 14 mg/dL (ref 8–27)
CO2: 22 mmol/L (ref 18–29)
CREATININE: 0.58 mg/dL (ref 0.57–1.00)
Calcium: 9.6 mg/dL (ref 8.7–10.3)
Chloride: 99 mmol/L (ref 96–106)
GFR calc non Af Amer: 94 mL/min/{1.73_m2} (ref >59)
GFR, EST AFRICAN AMERICAN: 108 mL/min/{1.73_m2} (ref >59)
Glucose: 110 mg/dL — ABNORMAL HIGH (ref 65–99)
Phosphorus: 3.7 mg/dL (ref 2.5–4.5)
Potassium: 4.8 mmol/L (ref 3.5–5.2)
SODIUM: 142 mmol/L (ref 134–144)

## 2016-03-02 LAB — HEMOGLOBIN A1C
ESTIMATED AVERAGE GLUCOSE: 166 mg/dL
HEMOGLOBIN A1C: 7.4 % — AB (ref 4.8–5.6)

## 2016-03-05 ENCOUNTER — Other Ambulatory Visit: Payer: Self-pay | Admitting: Internal Medicine

## 2016-03-05 DIAGNOSIS — M5116 Intervertebral disc disorders with radiculopathy, lumbar region: Secondary | ICD-10-CM

## 2016-03-15 ENCOUNTER — Encounter: Payer: Self-pay | Admitting: Internal Medicine

## 2016-03-15 ENCOUNTER — Ambulatory Visit (INDEPENDENT_AMBULATORY_CARE_PROVIDER_SITE_OTHER): Payer: Medicare Other | Admitting: Internal Medicine

## 2016-03-15 VITALS — BP 138/86 | HR 76 | Temp 98.4°F | Resp 18 | Wt 237.0 lb

## 2016-03-15 DIAGNOSIS — R300 Dysuria: Secondary | ICD-10-CM

## 2016-03-15 DIAGNOSIS — H60543 Acute eczematoid otitis externa, bilateral: Secondary | ICD-10-CM | POA: Diagnosis not present

## 2016-03-15 LAB — POC URINALYSIS WITH MICROSCOPIC (NON AUTO)MANUAL RESULT
BILIRUBIN UA: NEGATIVE
Bacteria, UA: 0
CRYSTALS: 0
Epithelial cells, urine per micros: 0
GLUCOSE UA: NEGATIVE
KETONES UA: NEGATIVE
LEUKOCYTES UA: NEGATIVE
Mucus, UA: 0
NITRITE UA: NEGATIVE
Protein, UA: NEGATIVE
RBC UA: NEGATIVE
RBC: 0 M/uL — AB (ref 4.04–5.48)
SPEC GRAV UA: 1.01
Urobilinogen, UA: 0.2
WBC Casts, UA: 0
pH, UA: 6

## 2016-03-15 MED ORDER — MOMETASONE FUROATE 0.1 % EX CREA: 1.0000 "application " | TOPICAL_CREAM | Freq: Every day | CUTANEOUS | 0 refills | Status: DC

## 2016-03-15 NOTE — Progress Notes (Signed)
Date:  03/15/2016   Name:  Molly Hoover   DOB:  12/22/1945   MRN:  [id number]   Chief Complaint: Urinary Frequency (patient states she is having urinary frequency, urgency and pelvic pressure all the time. Symptoms started on Saturday 03/13/16. She denies nausea, abdominal pain, blood in the urine, fever or chills. 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  Uses elocon cream and needs a refill.  Review of Systems  Constitutional: Negative for chills, fatigue and fever.  Respiratory: Negative for chest tightness and shortness of breath.   Cardiovascular: Negative for chest pain.  Gastrointestinal: Negative for abdominal pain, diarrhea, nausea and vomiting.  Genitourinary: Positive for frequency and urgency. Negative for dysuria and hematuria.    Patient Active Problem List   Diagnosis Date Noted  . Iliac artery aneurysm, bilateral ([place]) 12/15/2015  . Abdominal aortic aneurysm (AAA) without rupture ([place]) 12/11/2015  . Osteoarthritis of both knees 12/10/2015  . Uncontrolled type 2 diabetes mellitus without complication, with long-term current use of insulin ([place]) 04/03/2015  . Fecal occult blood test positive 12/19/2014  . COPD, moderate ([place]) 11/19/2014  . Edema extremities 11/19/2014  . H/O gastrointestinal disease 11/19/2014  . Hyperlipidemia associated with type 2 diabetes mellitus ([place]) 11/19/2014  . Muscle spasms of head and/or neck 11/19/2014  . Idiopathic osteoporosis 11/19/2014  . 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 Venous insufficiency of leg 11/19/2014  . Morbid obesity ([place]) 10/17/2013  . Essential hypertension 10/17/2013  . Chronic cough 10/17/2013  . Chest pain on exertion 10/17/2013    Prior to Admission medications   Medication Sig Start Date End Date Taking? Authorizing Provider  alendronate (FOSAMAX) 70 MG tablet TAKE 1 TABLET EVERY WEEK 08/20/15  Yes Glean Hess, MD  atorvastatin  (LIPITOR) 20 MG tablet TAKE 1 TABLET AT BEDTIME 05/03/15  Yes Glean Hess, MD  Cranberry 500 MG CAPS Take 500 mg by mouth daily.    Yes Historical Provider, MD  diclofenac (VOLTAREN) 75 MG EC tablet  11/03/15  Yes Historical Provider, MD  DULERA 200-5 MCG/ACT AERO USE 1 INHALATION TWICE A DAY 08/18/15  Yes Glean Hess, MD  gabapentin (NEURONTIN) 300 MG capsule TAKE 1 CAPSULE BY MOUTH AT BEDTIME 03/05/16  Yes Glean Hess, MD  gemfibrozil (LOPID) 600 MG tablet TAKE 1 TABLET TWICE A DAY 01/13/16  Yes Glean Hess, MD  glucose blood (ONE TOUCH ULTRA TEST) test strip Use 4 times per day to test blood sugar. 01/21/16  Yes Glean Hess, MD  insulin aspart (NOVOLOG FLEXPEN) 100 UNIT/ML FlexPen Inject 30 Units into the skin 3 (three) times daily with meals. 03/01/16  Yes Glean Hess, MD  Insulin Detemir (LEVEMIR FLEXTOUCH) 100 UNIT/ML Pen Inject 30 Units into the skin 2 (two) times daily. 03/01/16  Yes Glean Hess, MD  Insulin Pen Needle (FIFTY50 PEN NEEDLES) 31G X 5 MM MISC 1 each by Does not apply route 4 (four) times daily. 01/21/16  Yes Glean Hess, MD  JANUVIA 100 MG tablet TAKE 1 TABLET DAILY 05/03/15  Yes Glean Hess, MD  Lancets ([place]) lancets    Yes Historical Provider, MD  losartan (COZAAR) 25 MG tablet TAKE 1 TABLET DAILY 01/13/16  Yes Glean Hess, MD  meloxicam [facility]) 15 MG tablet  11/29/14  Yes Historical Provider, MD  metFORMIN (GLUCOPHAGE) 500 MG tablet TAKE 1 TABLET TWICE A DAY  11/18/15  Yes Glean Hess, MD  omeprazole (PRILOSEC) 20 MG capsule TAKE 1 CAPSULE DAILY 11/01/15  Yes Glean Hess, MD  SPIRIVA HANDIHALER 18 MCG inhalation capsule INHALE THE CONTENTS OF 1 CAPSULE DAILY 01/13/16  Yes Glean Hess, MD  traMADol (ULTRAM) 50 MG tablet Take 1 tablet by mouth daily as needed. 03/09/16  Yes Historical Provider, MD  Vitamin D, Ergocalciferol, (DRISDOL) 50000 units CAPS capsule TAKE 1 CAPSULE TWICE WEEKLY 08/20/15  Yes Glean Hess, MD    Allergies  Allergen Reactions  . Codeine Hives and Itching  . Combivent [Ipratropium-Albuterol] Itching  . Ivp Dye [Iodinated Diagnostic Agents] Hives and Itching    Past Surgical History:  Procedure Laterality Date  . BREAST BIOPSY    . CARPAL TUNNEL RELEASE     right  . CERVICAL FUSION  2002  . CHOLECYSTECTOMY    . ESOPHAGOGASTRODUODENOSCOPY  03/2014   benign stricture dilated  . HERNIA REPAIR    . LUMBAR FUSION     L1-4   . TOTAL ABDOMINAL HYSTERECTOMY      Social History  Substance Use Topics  . Smoking status: Former Smoker    Years: 15.00    Types: Cigarettes  . Smokeless tobacco: Never Used  . Alcohol use 1.2 oz/week    2 Standard drinks or equivalent per week     Comment: occasional     Medication list has been reviewed and updated.   Physical Exam  Constitutional: She is oriented to person, place, and time. She appears well-developed. No distress.  HENT:  Head: Normocephalic and atraumatic.  Cardiovascular: Normal rate, regular rhythm and normal heart sounds.   Pulmonary/Chest: Effort normal and breath sounds normal. No respiratory distress.  Abdominal: Soft. Bowel sounds are normal. There is no tenderness. There is no CVA tenderness.  Musculoskeletal: Normal range of motion.  Neurological: She is alert and oriented to person, place, and time.  Skin: Skin is warm and dry. No rash noted.  Psychiatric: She has a normal mood and affect. Her behavior is normal. Thought content normal.  Nursing note and vitals reviewed.   BP 138/86   Pulse 76   Temp 98.4 F (36.9 C)   Resp 18   Wt 237 lb (107.5 kg)   SpO2 97%   BMI 43.35 kg/m   Assessment and Plan: 1. Dysuria No evidence of infection Suspect incomplete emptying - may need urology evaluation - POC urinalysis w microscopic (non auto)  2. Eczema of external ear, bilateral - mometasone (ELOCON) 0.1 % cream; Apply 1 application topically daily.  Dispense: 15 g; Refill: 0   Halina Maidens, MD [facility]  03/15/2016

## 2016-04-15 ENCOUNTER — Encounter (INDEPENDENT_AMBULATORY_CARE_PROVIDER_SITE_OTHER): Payer: Self-pay | Admitting: Vascular Surgery

## 2016-04-29 ENCOUNTER — Other Ambulatory Visit: Payer: Self-pay | Admitting: Internal Medicine

## 2016-04-30 ENCOUNTER — Other Ambulatory Visit: Payer: Self-pay | Admitting: *Deleted

## 2016-04-30 ENCOUNTER — Ambulatory Visit: Payer: Self-pay | Admitting: Internal Medicine

## 2016-04-30 NOTE — Patient Outreach (Signed)
Met with patient at facility.  RNCM reviewed patient discharge planning needs and [facility] care management services. Patient states she is married and has supportive children close by who assist with transportation.  She denies any issues with obtaining medications. Patient is at facility to recover from TKR, she does state she has COPD/asthma that is well controlled, verbalizes understanding or signs and symptoms to monitor for and seek early interventions for.   No [facility] care management needs addressed. Patient given brochure and magnet.   RNCM spoke with Donnalee Curry, SW for facility, requested that she re consult RNCM if discharge needs change.  Plan to sign off case. Royetta Crochet. Laymond Purser, RN, BSN, [facility] Acute Care Coordinator [phone]

## 2016-05-03 ENCOUNTER — Ambulatory Visit: Payer: Self-pay | Admitting: Internal Medicine

## 2016-05-16 ENCOUNTER — Other Ambulatory Visit: Payer: Self-pay | Admitting: Internal Medicine

## 2016-05-18 ENCOUNTER — Ambulatory Visit: Payer: Self-pay | Admitting: Internal Medicine

## 2016-06-10 DIAGNOSIS — Z96652 Presence of left artificial knee joint: Secondary | ICD-10-CM | POA: Insufficient documentation

## 2016-06-18 ENCOUNTER — Encounter (INDEPENDENT_AMBULATORY_CARE_PROVIDER_SITE_OTHER): Payer: Medicare Other

## 2016-06-18 ENCOUNTER — Ambulatory Visit (INDEPENDENT_AMBULATORY_CARE_PROVIDER_SITE_OTHER): Payer: Medicare Other | Admitting: Vascular Surgery

## 2016-07-14 ENCOUNTER — Other Ambulatory Visit: Payer: Self-pay | Admitting: Internal Medicine

## 2016-07-21 ENCOUNTER — Other Ambulatory Visit: Payer: Self-pay | Admitting: Internal Medicine

## 2016-08-05 ENCOUNTER — Other Ambulatory Visit (INDEPENDENT_AMBULATORY_CARE_PROVIDER_SITE_OTHER): Payer: Self-pay | Admitting: Vascular Surgery

## 2016-08-05 DIAGNOSIS — I723 Aneurysm of iliac artery: Secondary | ICD-10-CM

## 2016-08-06 ENCOUNTER — Ambulatory Visit (INDEPENDENT_AMBULATORY_CARE_PROVIDER_SITE_OTHER): Payer: Medicare Other

## 2016-08-06 ENCOUNTER — Encounter (INDEPENDENT_AMBULATORY_CARE_PROVIDER_SITE_OTHER): Payer: Self-pay | Admitting: Vascular Surgery

## 2016-08-06 ENCOUNTER — Ambulatory Visit (INDEPENDENT_AMBULATORY_CARE_PROVIDER_SITE_OTHER): Payer: Medicare Other | Admitting: Vascular Surgery

## 2016-08-06 VITALS — BP 186/96 | HR 75 | Resp 17 | Wt 231.0 lb

## 2016-08-06 DIAGNOSIS — I10 Essential (primary) hypertension: Secondary | ICD-10-CM | POA: Diagnosis not present

## 2016-08-06 DIAGNOSIS — E1169 Type 2 diabetes mellitus with other specified complication: Secondary | ICD-10-CM

## 2016-08-06 DIAGNOSIS — I723 Aneurysm of iliac artery: Secondary | ICD-10-CM

## 2016-08-06 DIAGNOSIS — E785 Hyperlipidemia, unspecified: Secondary | ICD-10-CM | POA: Diagnosis not present

## 2016-08-06 NOTE — Assessment & Plan Note (Signed)
She had difficulty with the exam today secondary to discomfort in her lower abdomen, and her aortoiliac duplex was limited and could not really gain accurate measurements of the iliac artery aneurysms. These were small last year. There does not appear to be a hemodynamically significant stenosis in the aorta or either iliac artery. Her aneurysms were reasonably small last year at 1.8 and 1.6 cm in the common iliac arteries. At this point, I'm going to wait about 6 months and hope she has recovered further from her orthopedic issues and plan to repeat an ultrasound. If we are still unable to evaluate her vessels at that time, a CT scan would likely be required.

## 2016-08-06 NOTE — Assessment & Plan Note (Signed)
blood glucose control important in reducing the progression of atherosclerotic disease. Also, involved in wound healing. On appropriate medications.  

## 2016-08-06 NOTE — Assessment & Plan Note (Signed)
blood pressure control important in reducing the progression of atherosclerotic disease. On appropriate oral medications.  

## 2016-08-06 NOTE — Progress Notes (Signed)
MRN : [id number]  Molly Hoover is a 71 y.o. ([birth year]) female who presents with chief complaint of  Chief Complaint  Patient presents with  . Follow-up  .  History of Present Illness: Patient returns today in follow up of Iliac aneurysms. She has had a knee replacement since her last visit, and has apparently not recovered particularly well. She is having a lot of pain in her knee/thigh/hip on the left and has not regained her normal mobility. She had difficulty with the exam today secondary to discomfort in her lower abdomen, and her aortoiliac duplex was limited and could not really gain accurate measurements of the iliac artery aneurysms. These were small last year. There does not appear to be a hemodynamically significant stenosis in the aorta or either iliac artery.  Current Outpatient Prescriptions  Medication Sig Dispense Refill  . alendronate (FOSAMAX) 70 MG tablet TAKE 1 TABLET EVERY WEEK 12 tablet 3  . atorvastatin (LIPITOR) 20 MG tablet TAKE 1 TABLET AT BEDTIME 90 tablet 3  . Cranberry 500 MG CAPS Take 500 mg by mouth daily.     . diclofenac (VOLTAREN) 75 MG EC tablet     . DULERA 200-5 MCG/ACT AERO USE 1 INHALATION TWICE A DAY 39 g 3  . gabapentin (NEURONTIN) 300 MG capsule TAKE 1 CAPSULE BY MOUTH AT BEDTIME 30 capsule 5  . gemfibrozil (LOPID) 600 MG tablet TAKE 1 TABLET TWICE A DAY 180 tablet 1  . glucose blood (ONE TOUCH ULTRA TEST) test strip Use 4 times per day to test blood sugar. 150 each 12  . insulin aspart (NOVOLOG FLEXPEN) 100 UNIT/ML FlexPen Inject 30 Units into the skin 3 (three) times daily with meals. 30 mL 11  . Insulin Detemir (LEVEMIR FLEXTOUCH) 100 UNIT/ML Pen Inject 30 Units into the skin 2 (two) times daily. 30 mL 11  . Insulin Pen Needle (FIFTY50 PEN NEEDLES) 31G X 5 MM MISC 1 each by Does not apply route 4 (four) times daily. 400 each 3  . JANUVIA 100 MG tablet TAKE 1 TABLET DAILY 90 tablet 3  . Lancets (ACCU-CHEK MULTICLIX) lancets     .  losartan (COZAAR) 25 MG tablet TAKE 1 TABLET DAILY 90 tablet 1  . meloxicam (MOBIC) 15 MG tablet     . metFORMIN (GLUCOPHAGE) 500 MG tablet TAKE 1 TABLET TWICE A DAY 180 tablet 1  . mometasone (ELOCON) 0.1 % cream Apply 1 application topically daily. 15 g 0  . omeprazole (PRILOSEC) 20 MG capsule TAKE 1 CAPSULE DAILY 90 capsule 3  . SPIRIVA HANDIHALER 18 MCG inhalation capsule INHALE THE CONTENTS OF 1 CAPSULE DAILY 90 capsule 1  . Vitamin D, Ergocalciferol, (DRISDOL) 50000 units CAPS capsule TAKE 1 CAPSULE TWICE WEEKLY 24 capsule 3  . traMADol (ULTRAM) 50 MG tablet Take 1 tablet by mouth daily as needed.     No current facility-administered medications for this visit.     Past Medical History:  Diagnosis Date  . Allergic rhinitis   . Anxiety   . Asthma   . Bilateral breast cysts   . COPD (chronic obstructive pulmonary disease) ([place])   . Degenerative disc disease   . Depression   . Diabetes mellitus without complication ([place])   . Diabetic peripheral neuropathy ([place])   . Diverticulosis   . Enlarged heart   . GERD (gastroesophageal reflux disease)   . Hiatal hernia   . Hyperlipidemia   . Hypertension   . Hypothyroidism   . PVD (peripheral  vascular disease) ([place])   . Syncope and collapse   . Vitamin D deficiency     Past Surgical History:  Procedure Laterality Date  . BREAST BIOPSY    . CARPAL TUNNEL RELEASE     right  . CERVICAL FUSION  2002  . CHOLECYSTECTOMY    . ESOPHAGOGASTRODUODENOSCOPY  03/2014   benign stricture dilated  . HERNIA REPAIR    . LUMBAR FUSION     L1-4   . TOTAL ABDOMINAL HYSTERECTOMY      Social History Social History  Substance Use Topics  . Smoking status: Former Smoker    Years: 15.00    Types: Cigarettes  . Smokeless tobacco: Never Used  . Alcohol use 1.2 oz/week    2 Standard drinks or equivalent per week     Comment: occasional  Married  Family History Family History  Problem Relation Age of Onset  . Heart attack Mother 67  .  Hypertension Mother   . Heart attack Father     Allergies  Allergen Reactions  . Codeine Hives and Itching  . Combivent [Ipratropium-Albuterol] Itching  . Ivp Dye [Iodinated Diagnostic Agents] Hives and Itching     REVIEW OF SYSTEMS (Negative unless checked)  Constitutional: [] Weight loss  [] Fever  [] Chills Cardiac: [] Chest pain   [] Chest pressure   [] Palpitations   [] Shortness of breath when laying flat   [] Shortness of breath at rest   [] Shortness of breath with exertion. Vascular:  [x] Pain in legs with walking   [x] Pain in legs at rest   [x] Pain in legs when laying flat   [] Claudication   [] Pain in feet when walking  [] Pain in feet at rest  [] Pain in feet when laying flat   [] History of DVT   [] Phlebitis   [] Swelling in legs   [] Varicose veins   [] Non-healing ulcers Pulmonary:   [] Uses home oxygen   [] Productive cough   [] Hemoptysis   [] Wheeze  [] COPD   [] Asthma Neurologic:  [] Dizziness  [] Blackouts   [] Seizures   [] History of stroke   [] History of TIA  [] Aphasia   [] Temporary blindness   [] Dysphagia   [] Weakness or numbness in arms   [] Weakness or numbness in legs Musculoskeletal:  [x] Arthritis   [] Joint swelling   [x] Joint pain   [] Low back pain Hematologic:  [] Easy bruising  [] Easy bleeding   [] Hypercoagulable state   [] Anemic   Gastrointestinal:  [] Blood in stool   [] Vomiting blood  [] Gastroesophageal reflux/heartburn   [] Abdominal pain Genitourinary:  [] Chronic kidney disease   [] Difficult urination  [] Frequent urination  [] Burning with urination   [] Hematuria Skin:  [] Rashes   [] Ulcers   [] Wounds Psychological:  [] History of anxiety   []  History of major depression.  Physical Examination  BP (!) 186/96   Pulse 75   Resp 17   Wt 104.8 kg (231 lb)   BMI 42.25 kg/m  Gen:  WD/WN, NAD. obese Head: [place]/AT, No temporalis wasting. Ear/Nose/Throat: Hearing grossly intact, nares w/o erythema or drainage, trachea midline Eyes: Conjunctiva clear. Sclera non-icteric Neck: Supple.  No  JVD.  Pulmonary:  Good air movement, no use of accessory muscles.  Cardiac: RRR, normal S1, S2 Vascular:  Vessel Right Left  Radial Palpable Palpable  Ulnar Palpable Palpable  Brachial Palpable Palpable  Carotid Palpable, without bruit Palpable, without bruit  Aorta Not palpable N/A  Femoral Palpable Palpable  Popliteal Palpable Palpable  PT Palpable Palpable  DP Palpable Palpable   Gastrointestinal: soft, non-tender/non-distended.  Musculoskeletal: M/S 5/5 throughout.  No deformity or atrophy. Walks with a cane Neurologic: Sensation grossly intact in extremities.  Symmetrical.  Speech is fluent.  Psychiatric: Judgment intact, Mood & affect appropriate for pt's clinical situation. Dermatologic: No rashes or ulcers noted.  No cellulitis or open wounds.       Labs No results found for this or any previous visit (from the past 2160 hour(s)).  Radiology No results found.   Assessment/Plan  Hyperlipidemia associated with type 2 diabetes mellitus (HCC) blood glucose control important in reducing the progression of atherosclerotic disease. Also, involved in wound healing. On appropriate medications.   Essential hypertension blood pressure control important in reducing the progression of atherosclerotic disease. On appropriate oral medications.   Iliac artery aneurysm, bilateral (HCC) She had difficulty with the exam today secondary to discomfort in her lower abdomen, and her aortoiliac duplex was limited and could not really gain accurate measurements of the iliac artery aneurysms. These were small last year. There does not appear to be a hemodynamically significant stenosis in the aorta or either iliac artery. Her aneurysms were reasonably small last year at 1.8 and 1.6 cm in the common iliac arteries. At this point, I'm going to wait about 6 months and hope she has recovered further from her orthopedic issues and plan to repeat an ultrasound. If we are still unable to evaluate  her vessels at that time, a CT scan would likely be required.    Leotis Pain, MD  08/06/2016 10:00 AM    This note was created with Dragon medical transcription system.  Any errors from dictation are purely unintentional

## 2016-08-12 DIAGNOSIS — M79641 Pain in right hand: Secondary | ICD-10-CM | POA: Insufficient documentation

## 2016-08-13 ENCOUNTER — Other Ambulatory Visit: Payer: Self-pay | Admitting: Internal Medicine

## 2016-08-16 ENCOUNTER — Other Ambulatory Visit: Payer: Self-pay | Admitting: Unknown Physician Specialty

## 2016-08-16 DIAGNOSIS — M5416 Radiculopathy, lumbar region: Secondary | ICD-10-CM

## 2016-08-19 IMAGING — CR DG LUMBAR SPINE COMPLETE 4+V
5 series · 6 of 6 positions shown · non-contrast
Comparison: CT 12/19/2014.

CLINICAL DATA: Pain.

EXAM:
LUMBAR SPINE - COMPLETE 4+ VIEW

[l-spine ap]
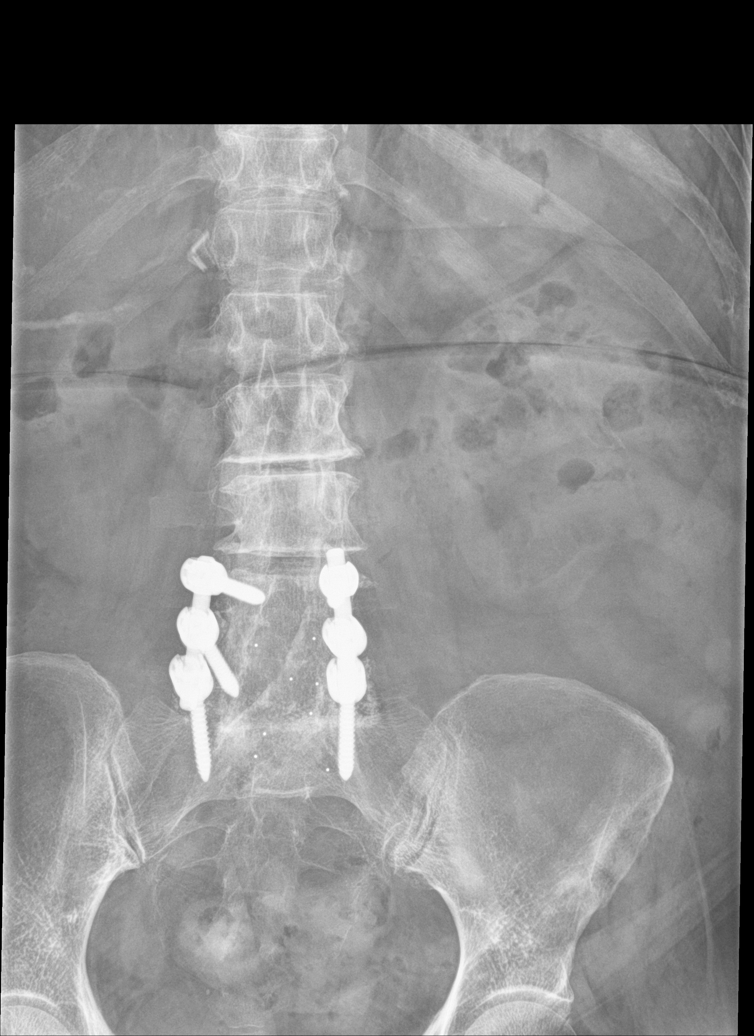

[l-spine obl (1 of 2)]
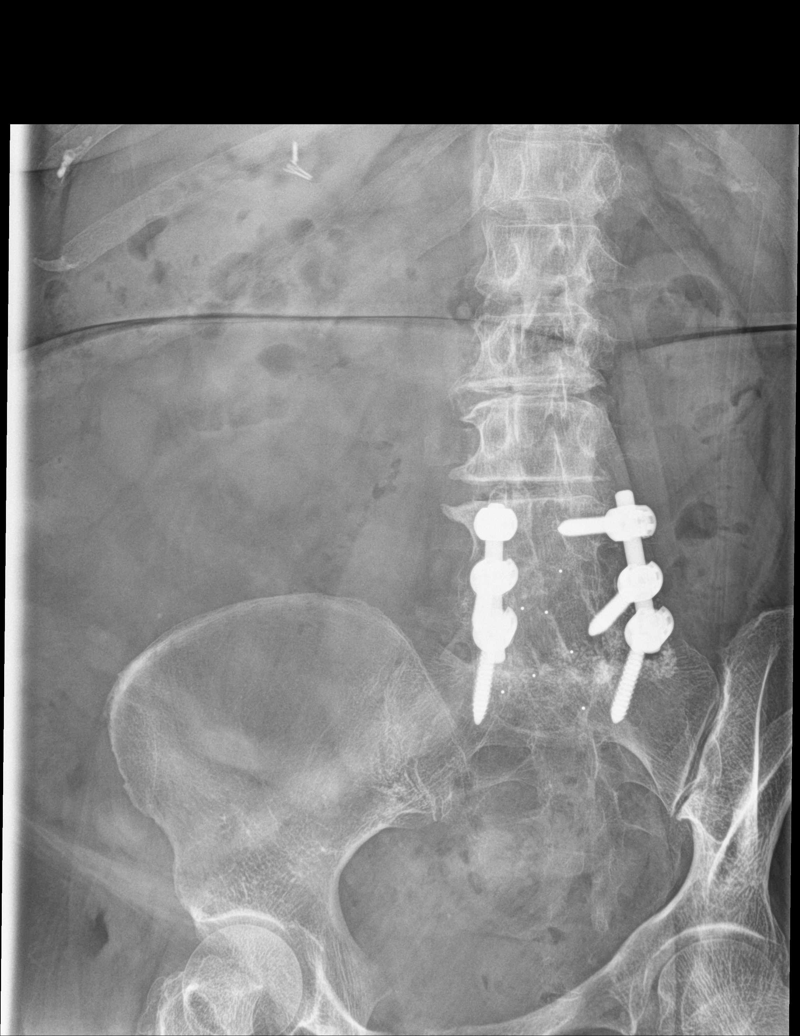

[Series 3: l-spine obl · 0.14mm/px · 2 of 2 slices shown (2 of 2)]
[im 1/2]
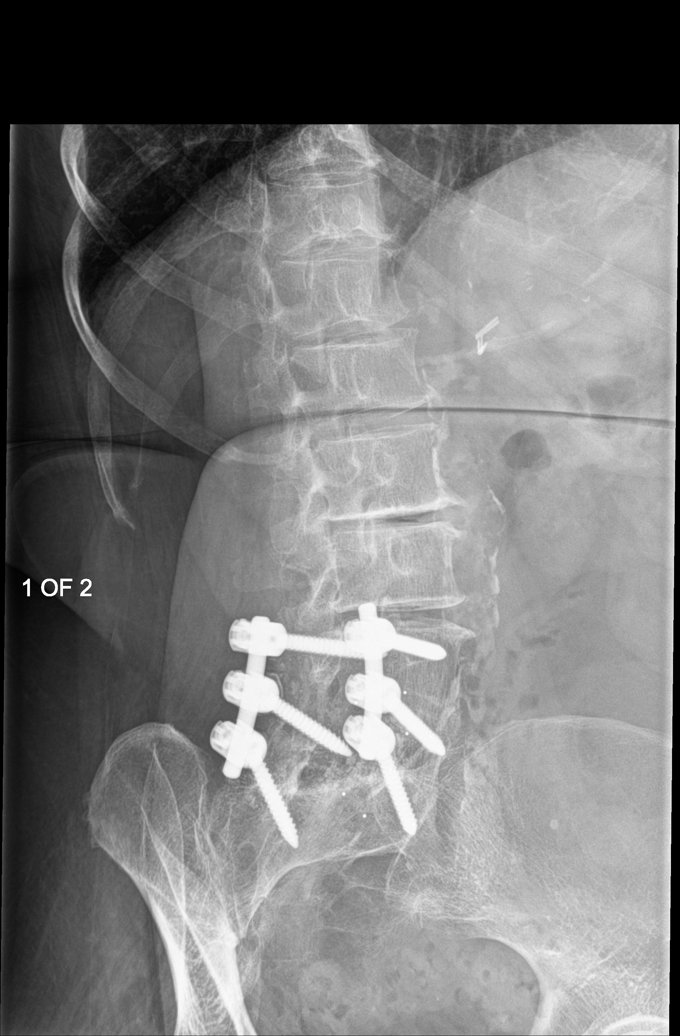
[im 2/2]
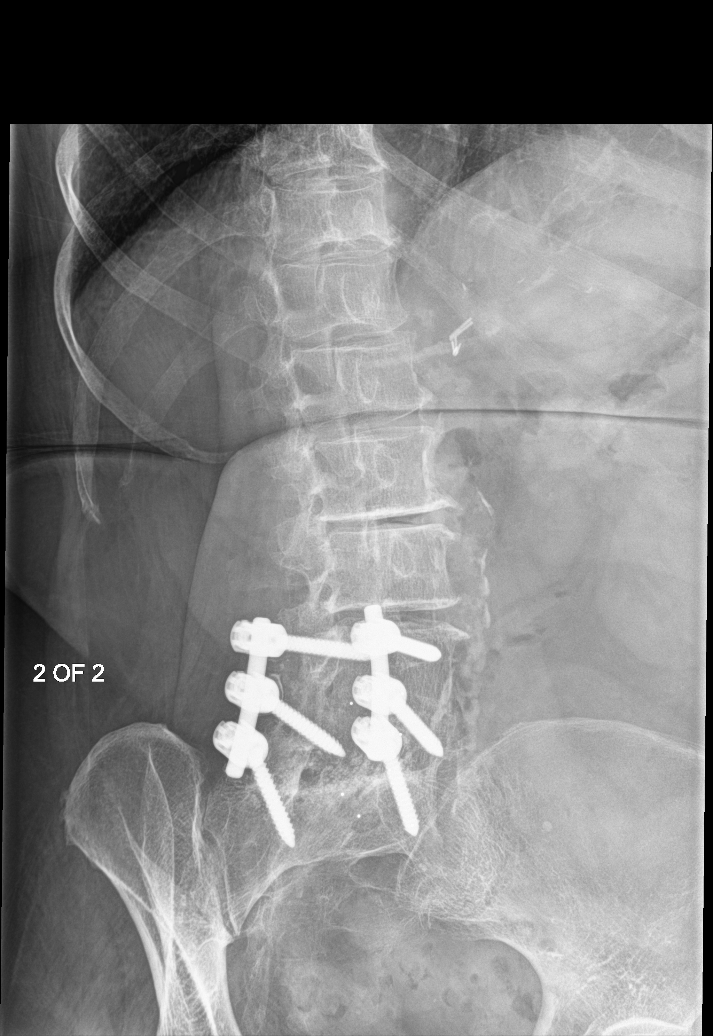

[l-spine lat]
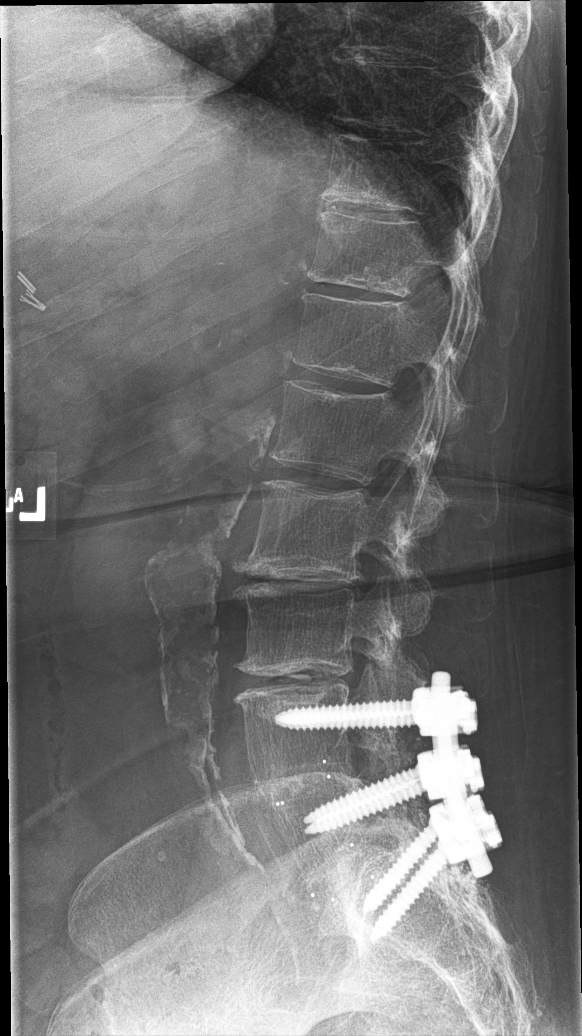

[l-spine spot]
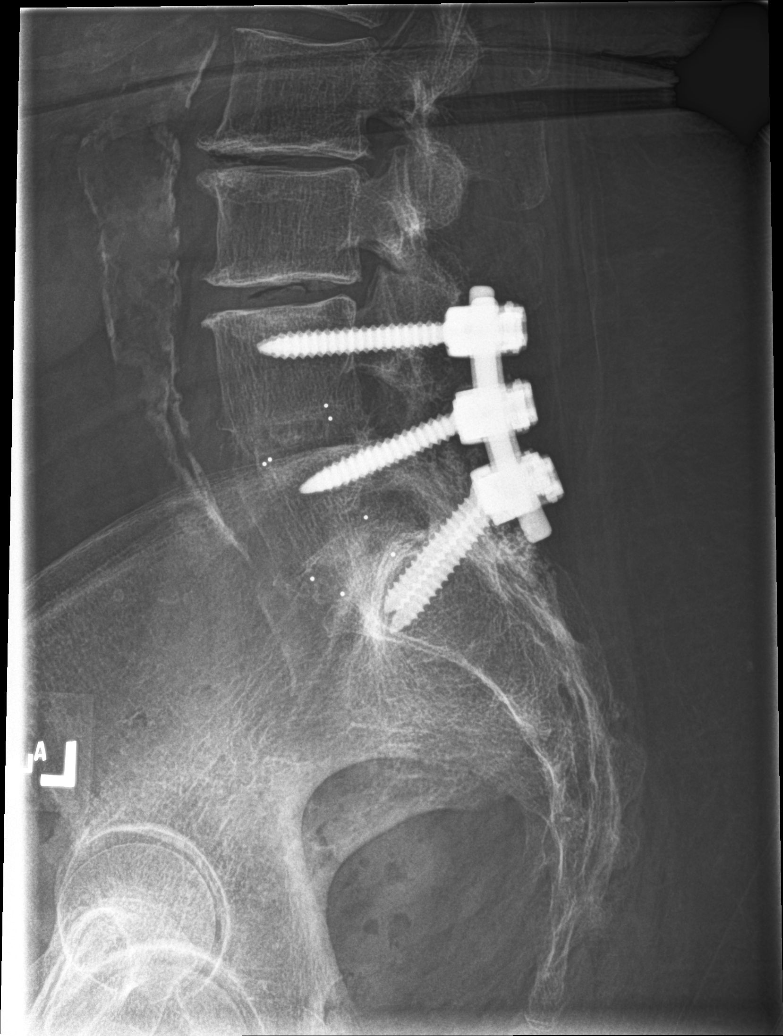

[6 of 6 positions shown; findings below may reference images not displayed]

FINDINGS: L4 through S1 posterior and interbody fusion. Hardware intact. No
acute bony abnormality. Postsurgical changes with laminectomy
defects noted over the lower lumbar spine. Diffuse severe
osteopenia. Diffuse multilevel degenerative change. 3.3 cm
infrarenal abdominal aortic aneurysm noted.
IMPRESSION: 1. L4 through S1 posterior interbody fusion. Hardware intact. Good
anatomic alignment. Prior lower lumbar laminectomies.

2. Severe diffuse degenerative change and osteopenia.

3. 3.3 cm infrarenal abdominal aortic aneurysm. Abdominal aortic
ultrasound can be obtained for further evaluation.

## 2016-08-31 ENCOUNTER — Ambulatory Visit
Admission: RE | Admit: 2016-08-31 | Discharge: 2016-08-31 | Disposition: A | Discharge status: Home / Self Care | Payer: Medicare Other | Source: Ambulatory Visit | Attending: Unknown Physician Specialty | Admitting: Unknown Physician Specialty

## 2016-08-31 DIAGNOSIS — M48061 Spinal stenosis, lumbar region without neurogenic claudication: Secondary | ICD-10-CM | POA: Diagnosis not present

## 2016-08-31 DIAGNOSIS — M5416 Radiculopathy, lumbar region: Secondary | ICD-10-CM | POA: Insufficient documentation

## 2016-08-31 DIAGNOSIS — Z981 Arthrodesis status: Secondary | ICD-10-CM | POA: Insufficient documentation

## 2016-09-17 ENCOUNTER — Other Ambulatory Visit: Payer: Self-pay | Admitting: Internal Medicine

## 2016-09-17 DIAGNOSIS — M5116 Intervertebral disc disorders with radiculopathy, lumbar region: Secondary | ICD-10-CM

## 2016-10-26 ENCOUNTER — Other Ambulatory Visit: Payer: Self-pay | Admitting: Internal Medicine

## 2016-11-01 LAB — HM DIABETES EYE EXAM

## 2016-11-12 ENCOUNTER — Other Ambulatory Visit: Payer: Self-pay | Admitting: Internal Medicine

## 2016-11-19 ENCOUNTER — Other Ambulatory Visit: Payer: Self-pay | Admitting: Internal Medicine

## 2016-11-19 DIAGNOSIS — M5116 Intervertebral disc disorders with radiculopathy, lumbar region: Secondary | ICD-10-CM

## 2016-12-16 ENCOUNTER — Other Ambulatory Visit (INDEPENDENT_AMBULATORY_CARE_PROVIDER_SITE_OTHER): Payer: Self-pay | Admitting: Vascular Surgery

## 2016-12-16 DIAGNOSIS — M79606 Pain in leg, unspecified: Secondary | ICD-10-CM

## 2016-12-17 ENCOUNTER — Ambulatory Visit (INDEPENDENT_AMBULATORY_CARE_PROVIDER_SITE_OTHER): Payer: Medicare Other

## 2016-12-17 ENCOUNTER — Ambulatory Visit (INDEPENDENT_AMBULATORY_CARE_PROVIDER_SITE_OTHER): Payer: Medicare Other | Admitting: Vascular Surgery

## 2016-12-17 ENCOUNTER — Encounter (INDEPENDENT_AMBULATORY_CARE_PROVIDER_SITE_OTHER): Payer: Self-pay | Admitting: Vascular Surgery

## 2016-12-17 VITALS — BP 171/90 | HR 71 | Resp 16 | Wt 250.0 lb

## 2016-12-17 DIAGNOSIS — IMO0001 Reserved for inherently not codable concepts without codable children: Secondary | ICD-10-CM

## 2016-12-17 DIAGNOSIS — I10 Essential (primary) hypertension: Secondary | ICD-10-CM

## 2016-12-17 DIAGNOSIS — E1165 Type 2 diabetes mellitus with hyperglycemia: Secondary | ICD-10-CM

## 2016-12-17 DIAGNOSIS — I723 Aneurysm of iliac artery: Secondary | ICD-10-CM | POA: Diagnosis not present

## 2016-12-17 DIAGNOSIS — Z794 Long term (current) use of insulin: Secondary | ICD-10-CM | POA: Diagnosis not present

## 2016-12-17 DIAGNOSIS — M79606 Pain in leg, unspecified: Secondary | ICD-10-CM | POA: Diagnosis not present

## 2016-12-17 NOTE — Progress Notes (Signed)
MRN : [id number]  Molly Hoover is a 71 y.o. ([birth year]) female who presents with chief complaint of  Chief Complaint  Patient presents with  . ultrasound follow up  .  History of Present Illness: Patient returns today in follow up of aneurysmal disease and is here today to check her ABIs for PAD. Her leg symptoms are about the same. She says they still tire easily and she cannot walk as well as she used to. Her noninvasive studies today demonstrate normal ABIs today of greater than 1 bilaterally with brisk triphasic waveforms and normal digital pressure consistent with no arterial insufficiency of the lower extremities.  Current Outpatient Prescriptions  Medication Sig Dispense Refill  . alendronate (FOSAMAX) 70 MG tablet TAKE 1 TABLET EVERY WEEK 12 tablet 3  . atorvastatin (LIPITOR) 20 MG tablet TAKE 1 TABLET AT BEDTIME 90 tablet 3  . Cranberry 500 MG CAPS Take 500 mg by mouth daily.     . diclofenac (VOLTAREN) 75 MG EC tablet     . DULERA 200-5 MCG/ACT AERO USE 1 INHALATION TWICE A DAY 39 g 3  . gabapentin (NEURONTIN) 300 MG capsule TAKE 1 CAPSULE BY MOUTH AT BEDTIME 30 capsule 3  . gemfibrozil (LOPID) 600 MG tablet TAKE 1 TABLET TWICE A DAY 180 tablet 1  . glucose blood (ONE TOUCH ULTRA TEST) test strip Use 4 times per day to test blood sugar. 150 each 12  . insulin aspart (NOVOLOG FLEXPEN) 100 UNIT/ML FlexPen Inject 30 Units into the skin 3 (three) times daily with meals. 30 mL 11  . Insulin Detemir (LEVEMIR FLEXTOUCH) 100 UNIT/ML Pen Inject 30 Units into the skin 2 (two) times daily. 30 mL 11  . Insulin Pen Needle (FIFTY50 PEN NEEDLES) 31G X 5 MM MISC 1 each by Does not apply route 4 (four) times daily. 400 each 3  . JANUVIA 100 MG tablet TAKE 1 TABLET DAILY 90 tablet 3  . Lancets (ACCU-CHEK MULTICLIX) lancets     . losartan (COZAAR) 25 MG tablet TAKE 1 TABLET DAILY 90 tablet 1  . metFORMIN (GLUCOPHAGE) 500 MG tablet TAKE 1 TABLET TWICE A DAY 180 tablet 1  . omeprazole  (PRILOSEC) 20 MG capsule TAKE 1 CAPSULE DAILY 90 capsule 3  . SPIRIVA HANDIHALER 18 MCG inhalation capsule INHALE THE CONTENTS OF 1 CAPSULE DAILY 90 capsule 1  . Vitamin D, Ergocalciferol, (DRISDOL) 50000 units CAPS capsule TAKE 1 CAPSULE TWICE WEEKLY 24 capsule 3  . meloxicam (MOBIC) 15 MG tablet     . mometasone (ELOCON) 0.1 % cream Apply 1 application topically daily. (Patient not taking: Reported on 12/17/2016) 15 g 0  . traMADol (ULTRAM) 50 MG tablet Take 1 tablet by mouth daily as needed.     No current facility-administered medications for this visit.     Past Medical History:  Diagnosis Date  . Allergic rhinitis   . Anxiety   . Asthma   . Bilateral breast cysts   . COPD (chronic obstructive pulmonary disease) ([place])   . Degenerative disc disease   . Depression   . Diabetes mellitus without complication ([place])   . Diabetic peripheral neuropathy ([place])   . Diverticulosis   . Enlarged heart   . GERD (gastroesophageal reflux disease)   . Hiatal hernia   . Hyperlipidemia   . Hypertension   . Hypothyroidism   . PVD (peripheral vascular disease) ([place])   . Syncope and collapse   . Vitamin D deficiency     Past  Surgical History:  Procedure Laterality Date  . BREAST BIOPSY    . CARPAL TUNNEL RELEASE     right  . CERVICAL FUSION  2002  . CHOLECYSTECTOMY    . ESOPHAGOGASTRODUODENOSCOPY  03/2014   benign stricture dilated  . HERNIA REPAIR    . LUMBAR FUSION     L1-4   . TOTAL ABDOMINAL HYSTERECTOMY     Social History  Substance Use Topics  . Smoking status: Former Smoker    Years: 15.00    Types: Cigarettes  . Smokeless tobacco: Never Used  . Alcohol use 1.2 oz/week     2 Standard drinks or equivalent per week      Comment: occasional  Married  Family History      Family History  Problem Relation Age of Onset  . Heart attack Mother 45  . Hypertension Mother   . Heart attack Father         Allergies  Allergen Reactions  . Codeine Hives and  Itching  . Combivent [Ipratropium-Albuterol] Itching  . Ivp Dye [Iodinated Diagnostic Agents] Hives and Itching     REVIEW OF SYSTEMS (Negative unless checked)  Constitutional: [] Weight loss  [] Fever  [] Chills Cardiac: [] Chest pain   [] Chest pressure   [] Palpitations   [] Shortness of breath when laying flat   [] Shortness of breath at rest   [] Shortness of breath with exertion. Vascular:  [x] Pain in legs with walking   [x] Pain in legs at rest   [x] Pain in legs when laying flat   [] Claudication   [] Pain in feet when walking  [] Pain in feet at rest  [] Pain in feet when laying flat   [] History of DVT   [] Phlebitis   [] Swelling in legs   [] Varicose veins   [] Non-healing ulcers Pulmonary:   [] Uses home oxygen   [] Productive cough   [] Hemoptysis   [] Wheeze  [] COPD   [] Asthma Neurologic:  [] Dizziness  [] Blackouts   [] Seizures   [] History of stroke   [] History of TIA  [] Aphasia   [] Temporary blindness   [] Dysphagia   [] Weakness or numbness in arms   [] Weakness or numbness in legs Musculoskeletal:  [x] Arthritis   [] Joint swelling   [x] Joint pain   [] Low back pain Hematologic:  [] Easy bruising  [] Easy bleeding   [] Hypercoagulable state   [] Anemic   Gastrointestinal:  [] Blood in stool   [] Vomiting blood  [] Gastroesophageal reflux/heartburn   [] Abdominal pain Genitourinary:  [] Chronic kidney disease   [] Difficult urination  [] Frequent urination  [] Burning with urination   [] Hematuria Skin:  [] Rashes   [] Ulcers   [] Wounds Psychological:  [] History of anxiety   []  History of major depression.   Physical Examination  BP (!) 171/90   Pulse 71   Resp 16   Wt 250 lb (113.4 kg)   BMI 45.73 kg/m  Gen:  WD/WN, NAD Head: [place]/AT, No temporalis wasting. Ear/Nose/Throat: Hearing grossly intact, nares w/o erythema or drainage, trachea midline Eyes: Conjunctiva clear. Sclera non-icteric Neck: Supple.  No JVD.  Pulmonary:  Good air movement, no use of accessory muscles.  Cardiac: RRR, normal S1,  S2 Vascular:  Vessel Right Left  Radial Palpable Palpable                          PT 1+ Palpable 1+ Palpable  DP Palpable Palpable    Musculoskeletal: M/S 5/5 throughout.  No deformity or atrophy. Mild lower extremity edema. Neurologic: Sensation grossly intact in extremities.  Symmetrical.  Speech  is fluent.  Psychiatric: Judgment intact, Mood & affect appropriate for pt's clinical situation. Dermatologic: No rashes or ulcers noted.  No cellulitis or open wounds.       Labs No results found for this or any previous visit (from the past 2160 hour(s)).  Radiology No results found.    Assessment/Plan Hyperlipidemia associated with type 2 diabetes mellitus (HCC) blood glucose control important in reducing the progression of atherosclerotic disease. Also, involved in wound healing. On appropriate medications.   Essential hypertension blood pressure control important in reducing the progression of atherosclerotic disease. On appropriate oral medications.   Iliac artery aneurysm, bilateral (HCC) She had difficulty with the exam a few months ago secondary to discomfort in her lower abdomen, and her aortoiliac duplex was limited and could not really gain accurate measurements of the iliac artery aneurysms. These were small last year. There does not appear to be a hemodynamically significant stenosis in the aorta or either iliac artery. Her aneurysms were reasonably small last year at 1.8 and 1.6 cm in the common iliac arteries. Scheduled to come back later this year for her repeat ultrasound.  Her noninvasive studies today demonstrate normal ABIs today of greater than 1 bilaterally with brisk triphasic waveforms and normal digital pressure consistent with no arterial insufficiency of the lower extremities.  No problem-specific Assessment & Plan notes found for this encounter.    Leotis Pain, MD  12/17/2016 11:43 AM    This note was created with Dragon medical  transcription system.  Any errors from dictation are purely unintentional

## 2016-12-17 NOTE — Assessment & Plan Note (Signed)
blood glucose control important in reducing the progression of atherosclerotic disease. Also, involved in wound healing. On appropriate medications.  

## 2016-12-17 NOTE — Assessment & Plan Note (Signed)
blood pressure control important in reducing the progression of atherosclerotic disease an aneurysmal disease. On appropriate oral medications.

## 2016-12-31 ENCOUNTER — Ambulatory Visit (INDEPENDENT_AMBULATORY_CARE_PROVIDER_SITE_OTHER): Payer: Medicare Other | Admitting: Internal Medicine

## 2016-12-31 ENCOUNTER — Encounter: Payer: Self-pay | Admitting: Internal Medicine

## 2016-12-31 VITALS — BP 138/78 | HR 83 | Ht 62.0 in | Wt 252.0 lb

## 2016-12-31 DIAGNOSIS — E785 Hyperlipidemia, unspecified: Secondary | ICD-10-CM

## 2016-12-31 DIAGNOSIS — I10 Essential (primary) hypertension: Secondary | ICD-10-CM | POA: Diagnosis not present

## 2016-12-31 DIAGNOSIS — Z1231 Encounter for screening mammogram for malignant neoplasm of breast: Secondary | ICD-10-CM | POA: Diagnosis not present

## 2016-12-31 DIAGNOSIS — E1165 Type 2 diabetes mellitus with hyperglycemia: Secondary | ICD-10-CM | POA: Diagnosis not present

## 2016-12-31 DIAGNOSIS — Z23 Encounter for immunization: Secondary | ICD-10-CM | POA: Diagnosis not present

## 2016-12-31 DIAGNOSIS — E1169 Type 2 diabetes mellitus with other specified complication: Secondary | ICD-10-CM | POA: Diagnosis not present

## 2016-12-31 DIAGNOSIS — Z794 Long term (current) use of insulin: Secondary | ICD-10-CM

## 2016-12-31 DIAGNOSIS — Z1159 Encounter for screening for other viral diseases: Secondary | ICD-10-CM

## 2016-12-31 DIAGNOSIS — M48061 Spinal stenosis, lumbar region without neurogenic claudication: Secondary | ICD-10-CM | POA: Insufficient documentation

## 2016-12-31 DIAGNOSIS — IMO0001 Reserved for inherently not codable concepts without codable children: Secondary | ICD-10-CM

## 2016-12-31 NOTE — Progress Notes (Signed)
Date:  12/31/2016   Name:  Molly Hoover   DOB:  03/17/1946   MRN:  [id number]   Chief Complaint: Diabetes (BS- running in 200's. This morning- 177. ) and Hypertension Diabetes  She presents for her follow-up diabetic visit. She has type 2 diabetes mellitus. Her disease course has been worsening. Pertinent negatives for hypoglycemia include no headaches or tremors. Pertinent negatives for diabetes include no chest pain, no fatigue, no foot paresthesias, no polydipsia and no polyuria. Symptoms are worsening. Her weight is increasing steadily. She is following a generally healthy diet. Her breakfast blood glucose is taken between 6-7 am. Her breakfast blood glucose range is generally 180-200 mg/dl. An ACE inhibitor/angiotensin II receptor blocker is being taken.  Hypertension  Pertinent negatives include no chest pain, headaches, palpitations or shortness of breath.  Hyperlipidemia  This is a chronic problem. Pertinent negatives include no chest pain or shortness of breath. Current antihyperlipidemic treatment includes statins. The current treatment provides significant improvement of lipids.  Back pain - due to spinal stenosis with some radiation down left leg.  Taking gabapentin but not sure it is helping much.  Has not investigated surgery.  Walking with a cane.  Lab Results  Component Value Date   HGBA1C 7.4 (H) 03/01/2016     Review of Systems  Constitutional: Positive for unexpected weight change (20 lbs since april). Negative for appetite change, fatigue and fever.  HENT: Negative for tinnitus and trouble swallowing.   Eyes: Positive for visual disturbance (recent eye surgery).  Respiratory: Negative for cough, chest tightness and shortness of breath.   Cardiovascular: Negative for chest pain, palpitations and leg swelling.  Gastrointestinal: Negative for abdominal pain.  Endocrine: Negative for polydipsia and polyuria.  Genitourinary: Negative for dysuria and hematuria.    Musculoskeletal: Negative for arthralgias.  Neurological: Negative for tremors, numbness and headaches.  Psychiatric/Behavioral: Negative for dysphoric mood.    Patient Active Problem List   Diagnosis Date Noted  . 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 Iliac artery aneurysm, bilateral ([place]) 12/15/2015  . Abdominal aortic aneurysm (AAA) without rupture ([place]) 12/11/2015  . Osteoarthritis of both knees 12/10/2015  . Uncontrolled type 2 diabetes mellitus without complication, with long-term current use of insulin ([place]) 04/03/2015  . COPD, moderate ([place]) 11/19/2014  . Edema extremities 11/19/2014  . H/O gastrointestinal disease 11/19/2014  . Hyperlipidemia associated with type 2 diabetes mellitus ([place]) 11/19/2014  . Muscle spasms of head and/or neck 11/19/2014  . Idiopathic osteoporosis 11/19/2014  . 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 Venous insufficiency of leg 11/19/2014  . Morbid obesity ([place]) 10/17/2013  . Essential hypertension 10/17/2013  . Chronic cough 10/17/2013    Prior to Admission medications   Medication Sig Start Date End Date Taking? Authorizing Provider  alendronate (FOSAMAX) 70 MG tablet TAKE 1 TABLET EVERY WEEK 07/21/16  Yes Glean Hess, MD  atorvastatin (LIPITOR) 20 MG tablet TAKE 1 TABLET AT BEDTIME 04/29/16  Yes Glean Hess, MD  Cranberry 500 MG CAPS Take 500 mg by mouth daily.    Yes [provider]  diclofenac (VOLTAREN) 75 MG EC tablet  11/03/15  Yes [provider]  DULERA 200-5 MCG/ACT AERO USE 1 INHALATION TWICE A DAY 08/13/16  Yes Glean Hess, MD  gabapentin (NEURONTIN) 300 MG capsule TAKE 1 CAPSULE BY MOUTH AT BEDTIME 11/19/16  Yes Glean Hess, MD  gemfibrozil (LOPID) 600 MG tablet TAKE 1 TABLET TWICE A DAY 07/14/16  Yes Army Melia,  Jesse Sans, MD  glucose blood (ONE TOUCH ULTRA TEST) test strip Use 4 times per day to test blood sugar. 01/21/16  Yes Glean Hess, MD  insulin aspart (NOVOLOG FLEXPEN) 100  UNIT/ML FlexPen Inject 30 Units into the skin 3 (three) times daily with meals. 03/01/16  Yes Glean Hess, MD  Insulin Detemir (LEVEMIR FLEXTOUCH) 100 UNIT/ML Pen Inject 30 Units into the skin 2 (two) times daily. 03/01/16  Yes Glean Hess, MD  Insulin Pen Needle (FIFTY50 PEN NEEDLES) 31G X 5 MM MISC 1 each by Does not apply route 4 (four) times daily. 01/21/16  Yes Glean Hess, MD  JANUVIA 100 MG tablet TAKE 1 TABLET DAILY 04/29/16  Yes Glean Hess, MD  Lancets (ACCU-CHEK MULTICLIX) lancets    Yes [provider]  losartan (COZAAR) 25 MG tablet TAKE 1 TABLET DAILY 07/14/16  Yes Glean Hess, MD  meloxicam [facility]) 15 MG tablet  11/29/14  Yes [provider]  metFORMIN (GLUCOPHAGE) 500 MG tablet TAKE 1 TABLET TWICE A DAY 11/12/16  Yes Glean Hess, MD  mometasone (ELOCON) 0.1 % cream Apply 1 application topically daily. 03/15/16  Yes Glean Hess, MD  omeprazole (PRILOSEC) 20 MG capsule TAKE 1 CAPSULE DAILY 10/26/16  Yes Glean Hess, MD  [facility] HANDIHALER 18 MCG inhalation capsule INHALE THE CONTENTS OF 1 CAPSULE DAILY 07/14/16  Yes Glean Hess, MD  traMADol (ULTRAM) 50 MG tablet Take 1 tablet by mouth daily as needed. 03/09/16  Yes [provider]  Vitamin D, Ergocalciferol, (DRISDOL) 50000 units CAPS capsule TAKE 1 CAPSULE TWICE WEEKLY 07/21/16  Yes Glean Hess, MD    Allergies  Allergen Reactions  . Codeine Hives and Itching  . Combivent [Ipratropium-Albuterol] Itching  . Ivp Dye [Iodinated Diagnostic Agents] Hives and Itching  . Meloxicam     Past Surgical History:  Procedure Laterality Date  . BREAST BIOPSY    . CARPAL TUNNEL RELEASE     right  . CERVICAL FUSION  2002  . CHOLECYSTECTOMY    . ESOPHAGOGASTRODUODENOSCOPY  03/2014   benign stricture dilated  . HERNIA REPAIR    . LUMBAR FUSION     L1-4   . TOTAL ABDOMINAL HYSTERECTOMY      Social History  Substance Use Topics  . Smoking status: Former  Smoker    Years: 15.00    Types: Cigarettes  . Smokeless tobacco: Never Used  . Alcohol use 1.2 oz/week    2 Standard drinks or equivalent per week     Comment: occasional     Medication list has been reviewed and updated.  PHQ 2/9 Scores 12/31/2016 12/10/2015 12/19/2014  PHQ - 2 Score 0 0 0    Physical Exam  Constitutional: She is oriented to person, place, and time. She appears well-developed. No distress.  HENT:  Head: Normocephalic and atraumatic.  Cardiovascular: Normal rate, regular rhythm and normal heart sounds.   Pulmonary/Chest: Effort normal and breath sounds normal. No respiratory distress. She has no wheezes. She has no rhonchi.  Musculoskeletal: She exhibits edema (trace).  Neurological: She is alert and oriented to person, place, and time. No sensory deficit.  Skin: Skin is warm, dry and intact. No rash noted.  Psychiatric: She has a normal mood and affect. Her behavior is normal. Thought content normal.  Nursing note and vitals reviewed.   BP 138/78   Pulse 83   Ht 5\' 2"  (1.575 m)   Wt 252 lb (114.3 kg)  SpO2 98%   BMI 46.09 kg/m   Assessment and Plan: 1. Uncontrolled type 2 diabetes mellitus without complication, with long-term current use of insulin ([place]) Check labs; will likely need to increase Lantus Work on diet and weight loss - Comprehensive metabolic panel - Hemoglobin A1c - Microalbumin / creatinine urine ratio  2. Essential hypertension Fair control  3. Spinal stenosis of lumbar region, unspecified whether neurogenic claudication present May stop gabapentin due to questionable benefit  4. Hyperlipidemia associated with type 2 diabetes mellitus ([place]) Continue statin therapy  5. Encounter for screening mammogram for breast cancer At [place]; Future  6. Need for influenza vaccination - Flu Vaccine QUAD 36+ mos IM  7. Need for hepatitis C screening test - Hepatitis C antibody  8. Need  for pneumococcal vaccination - Pneumococcal polysaccharide vaccine 23-valent greater than or equal to 2yo subcutaneous/IM   No orders of the defined types were placed in this encounter. VIS for Influenza and PPV-23 given  Partially dictated using Editor, commissioning. Any errors are unintentional.  Halina Maidens, MD [facility]  12/31/2016

## 2017-01-01 ENCOUNTER — Encounter: Payer: Self-pay | Admitting: Internal Medicine

## 2017-01-01 DIAGNOSIS — E113293 Type 2 diabetes mellitus with mild nonproliferative diabetic retinopathy without macular edema, bilateral: Secondary | ICD-10-CM

## 2017-01-01 LAB — COMPREHENSIVE METABOLIC PANEL
ALT: 19 IU/L (ref 0–32)
AST: 23 IU/L (ref 0–40)
Albumin/Globulin Ratio: 1.8 (ref 1.2–2.2)
Albumin: 4.6 g/dL (ref 3.5–4.8)
Alkaline Phosphatase: 69 IU/L (ref 39–117)
BUN/Creatinine Ratio: 26 (ref 12–28)
BUN: 18 mg/dL (ref 8–27)
Bilirubin Total: 0.4 mg/dL (ref 0.0–1.2)
CALCIUM: 10.1 mg/dL (ref 8.7–10.3)
CO2: 25 mmol/L (ref 20–29)
CREATININE: 0.7 mg/dL (ref 0.57–1.00)
Chloride: 98 mmol/L (ref 96–106)
GFR calc Af Amer: 101 mL/min/{1.73_m2} (ref >59)
GFR, EST NON AFRICAN AMERICAN: 87 mL/min/{1.73_m2} (ref >59)
GLOBULIN, TOTAL: 2.5 g/dL (ref 1.5–4.5)
Glucose: 109 mg/dL — ABNORMAL HIGH (ref 65–99)
Potassium: 4.9 mmol/L (ref 3.5–5.2)
Sodium: 141 mmol/L (ref 134–144)
Total Protein: 7.1 g/dL (ref 6.0–8.5)

## 2017-01-01 LAB — MICROALBUMIN / CREATININE URINE RATIO
Creatinine, Urine: 51.9 mg/dL
Microalb/Creat Ratio: 10.4 mg/g creat (ref 0.0–30.0)
Microalbumin, Urine: 5.4 ug/mL

## 2017-01-01 LAB — HEPATITIS C ANTIBODY: Hep C Virus Ab: <0.1 s/co ratio (ref 0.0–0.9)

## 2017-01-01 LAB — HEMOGLOBIN A1C
ESTIMATED AVERAGE GLUCOSE: 180 mg/dL
Hgb A1c MFr Bld: 7.9 % — ABNORMAL HIGH (ref 4.8–5.6)

## 2017-01-03 ENCOUNTER — Ambulatory Visit (INDEPENDENT_AMBULATORY_CARE_PROVIDER_SITE_OTHER): Payer: Medicare Other

## 2017-01-03 VITALS — BP 142/88 | HR 65 | Temp 98.0°F | Resp 16 | Ht 62.0 in | Wt 255.4 lb

## 2017-01-03 DIAGNOSIS — Z0000 Encounter for general adult medical examination without abnormal findings: Secondary | ICD-10-CM | POA: Diagnosis not present

## 2017-01-03 NOTE — Patient Instructions (Signed)
Molly Hoover , Thank you for taking time to come for your Medicare Wellness Visit. I appreciate your ongoing commitment to your health goals. Please review the following plan we discussed and let me know if I can assist you in the future.   Screening recommendations/referrals: Colonoscopy: completed 04/28/2011 Mammogram: due now- please call and schedule Bone Density: due now- declined Recommended yearly ophthalmology/optometry visit for glaucoma screening and checkup Recommended yearly dental visit for hygiene and checkup  Vaccinations: Influenza vaccine:up to date Pneumococcal vaccine: up to date Tdap vaccine: up to date, please bring a copy of your immunization records Shingles vaccine: up to date  Advanced directives: Advance directive discussed with you today.declined information today  Conditions/risks identified: none  Next appointment: Follow up on 05/02/2017 at 72:00am with Dr.Berglund. Follow up in one year for your annual wellness exam.   Preventive Care 72 Years and Older, Female Preventive care refers to lifestyle choices and visits with your health care provider that can promote health and wellness. What does preventive care include?  A yearly physical exam. This is also called an annual well check.  Dental exams once or twice a year.  Routine eye exams. Ask your health care provider how often you should have your eyes checked.  Personal lifestyle choices, including:  Daily care of your teeth and gums.  Regular physical activity.  Eating a healthy diet.  Avoiding tobacco and drug use.  Limiting alcohol use.  Practicing safe sex.  Taking low-dose aspirin every day.  Taking vitamin and mineral supplements as recommended by your health care provider. What happens during an annual well check? The services and screenings done by your health care provider during your annual well check will depend on your age, overall health, lifestyle risk factors, and family  history of disease. Counseling  Your health care provider may ask you questions about your:  Alcohol use.  Tobacco use.  Drug use.  Emotional well-being.  Home and relationship well-being.  Sexual activity.  Eating habits.  History of falls.  Memory and ability to understand (cognition).  Work and work Statistician.  Reproductive health. Screening  You may have the following tests or measurements:  Height, weight, and BMI.  Blood pressure.  Lipid and cholesterol levels. These may be checked every 5 years, or more frequently if you are over 15 years old.  Skin check.  Lung cancer screening. You may have this screening every year starting at age 67 if you have a 30-pack-year history of smoking and currently smoke or have quit within the past 15 years.  Fecal occult blood test (FOBT) of the stool. You may have this test every year starting at age 56.  Flexible sigmoidoscopy or colonoscopy. You may have a sigmoidoscopy every 5 years or a colonoscopy every 10 years starting at age 15.  Hepatitis C blood test.  Hepatitis B blood test.  Sexually transmitted disease (STD) testing.  Diabetes screening. This is done by checking your blood sugar (glucose) after you have not eaten for a while (fasting). You may have this done every 1-3 years.  Bone density scan. This is done to screen for osteoporosis. You may have this done starting at age 35.  Mammogram. This may be done every 1-2 years. Talk to your health care provider about how often you should have regular mammograms. Talk with your health care provider about your test results, treatment options, and if necessary, the need for more tests. Vaccines  Your health care provider may recommend certain vaccines,  such as:  Influenza vaccine. This is recommended every year.  Tetanus, diphtheria, and acellular pertussis (Tdap, Td) vaccine. You may need a Td booster every 10 years.  Zoster vaccine. You may need this after  age 39.  Pneumococcal 13-valent conjugate (PCV13) vaccine. One dose is recommended after age 72.  Pneumococcal polysaccharide (PPSV23) vaccine. One dose is recommended after age 72. Talk to your health care provider about which screenings and vaccines you need and how often you need them. This information is not intended to replace advice given to you by your health care provider. Make sure you discuss any questions you have with your health care provider. Document Released: 05/09/2015 Document Revised: 12/31/2015 Document Reviewed: 02/11/2015 Elsevier Interactive Patient Education  2017 [place] Prevention in the Home Falls can cause injuries. They can happen to people of all ages. There are many things you can do to make your home safe and to help prevent falls. What can I do on the outside of my home?  Regularly fix the edges of walkways and driveways and fix any cracks.  Remove anything that might make you trip as you walk through a door, such as a raised step or threshold.  Trim any bushes or trees on the path to your home.  Use bright outdoor lighting.  Clear any walking paths of anything that might make someone trip, such as rocks or tools.  Regularly check to see if handrails are loose or broken. Make sure that both sides of any steps have handrails.  Any raised decks and porches should have guardrails on the edges.  Have any leaves, snow, or ice cleared regularly.  Use sand or salt on walking paths during winter.  Clean up any spills in your garage right away. This includes oil or grease spills. What can I do in the bathroom?  Use night lights.  Install grab bars by the toilet and in the tub and shower. Do not use towel bars as grab bars.  Use non-skid mats or decals in the tub or shower.  If you need to sit down in the shower, use a plastic, non-slip stool.  Keep the floor dry. Clean up any water that spills on the floor as soon as it  happens.  Remove soap buildup in the tub or shower regularly.  Attach bath mats securely with double-sided non-slip rug tape.  Do not have throw rugs and other things on the floor that can make you trip. What can I do in the bedroom?  Use night lights.  Make sure that you have a light by your bed that is easy to reach.  Do not use any sheets or blankets that are too big for your bed. They should not hang down onto the floor.  Have a firm chair that has side arms. You can use this for support while you get dressed.  Do not have throw rugs and other things on the floor that can make you trip. What can I do in the kitchen?  Clean up any spills right away.  Avoid walking on wet floors.  Keep items that you use a lot in easy-to-reach places.  If you need to reach something above you, use a strong step stool that has a grab bar.  Keep electrical cords out of the way.  Do not use floor polish or wax that makes floors slippery. If you must use wax, use non-skid floor wax.  Do not have throw rugs and other things on  the floor that can make you trip. What can I do with my stairs?  Do not leave any items on the stairs.  Make sure that there are handrails on both sides of the stairs and use them. Fix handrails that are broken or loose. Make sure that handrails are as long as the stairways.  Check any carpeting to make sure that it is firmly attached to the stairs. Fix any carpet that is loose or worn.  Avoid having throw rugs at the top or bottom of the stairs. If you do have throw rugs, attach them to the floor with carpet tape.  Make sure that you have a light switch at the top of the stairs and the bottom of the stairs. If you do not have them, ask someone to add them for you. What else can I do to help prevent falls?  Wear shoes that:  Do not have high heels.  Have rubber bottoms.  Are comfortable and fit you well.  Are closed at the toe. Do not wear sandals.  If you  use a stepladder:  Make sure that it is fully opened. Do not climb a closed stepladder.  Make sure that both sides of the stepladder are locked into place.  Ask someone to hold it for you, if possible.  Clearly mark and make sure that you can see:  Any grab bars or handrails.  First and last steps.  Where the edge of each step is.  Use tools that help you move around (mobility aids) if they are needed. These include:  Canes.  Walkers.  Scooters.  Crutches.  Turn on the lights when you go into a dark area. Replace any light bulbs as soon as they burn out.  Set up your furniture so you have a clear path. Avoid moving your furniture around.  If any of your floors are uneven, fix them.  If there are any pets around you, be aware of where they are.  Review your medicines with your doctor. Some medicines can make you feel dizzy. This can increase your chance of falling. Ask your doctor what other things that you can do to help prevent falls. This information is not intended to replace advice given to you by your health care provider. Make sure you discuss any questions you have with your health care provider. Document Released: 02/06/2009 Document Revised: 09/18/2015 Document Reviewed: 05/17/2014 Elsevier Interactive Patient Education  2017 [employer].

## 2017-01-03 NOTE — Progress Notes (Signed)
Subjective:   Molly Hoover is a 71 y.o. female who presents for Medicare Annual (Subsequent) preventive examination.  Review of Systems:   Cardiac Risk Factors include: obesity (BMI >30kg/m2);advanced age (>70men, >62 women);diabetes mellitus;dyslipidemia;hypertension     Objective:     Vitals: BP (!) 142/88 (BP Location: Left Arm, Patient Position: Sitting)   Pulse 65   Temp 98 F (36.7 C)   Resp 16   Ht 5\' 2"  (1.575 m)   Wt 255 lb 6.4 oz (115.8 kg)   BMI 46.71 kg/m   Body mass index is 46.71 kg/m.   Tobacco History  Smoking Status  . Former Smoker  . Years: 15.00  . Types: Cigarettes  Smokeless Tobacco  . Never Used     Counseling given: Not Answered   Past Medical History:  Diagnosis Date  . Allergic rhinitis   . Anxiety   . Asthma   . Bilateral breast cysts   . COPD (chronic obstructive pulmonary disease) ([place])   . Degenerative disc disease   . Depression   . Diabetes mellitus without complication ([place])   . Diabetic peripheral neuropathy ([place])   . Diverticulosis   . Enlarged heart   . GERD (gastroesophageal reflux disease)   . Hiatal hernia   . Hyperlipidemia   . Hypertension   . Hypothyroidism   . PVD (peripheral vascular disease) ([place])   . Syncope and collapse   . Vitamin D deficiency    Past Surgical History:  Procedure Laterality Date  . BREAST BIOPSY    . CARPAL TUNNEL RELEASE     right  . CERVICAL FUSION  2002  . CHOLECYSTECTOMY    . ESOPHAGOGASTRODUODENOSCOPY  03/2014   benign stricture dilated  . HERNIA REPAIR    . LUMBAR FUSION     L1-4   . 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 TOTAL ABDOMINAL HYSTERECTOMY     Family History  Problem Relation Age of Onset  . Heart attack Mother 3  . Hypertension Mother   . Heart attack Father    History  Sexual Activity  . Sexual activity: No    Outpatient Encounter Prescriptions as of 01/03/2017  Medication Sig  . alendronate (FOSAMAX) 70 MG tablet TAKE 1 TABLET EVERY  WEEK  . atorvastatin (LIPITOR) 20 MG tablet TAKE 1 TABLET AT BEDTIME  . Cranberry 500 MG CAPS Take 500 mg by mouth daily.   . diclofenac (VOLTAREN) 75 MG EC tablet   . DULERA 200-5 MCG/ACT AERO USE 1 INHALATION TWICE A DAY  . gemfibrozil (LOPID) 600 MG tablet TAKE 1 TABLET TWICE A DAY  . insulin aspart (NOVOLOG FLEXPEN) 100 UNIT/ML FlexPen Inject 30 Units into the skin 3 (three) times daily with meals.  . Insulin Detemir (LEVEMIR FLEXTOUCH) 100 UNIT/ML Pen Inject 30 Units into the skin 2 (two) times daily.  . Insulin Pen Needle (FIFTY50 PEN NEEDLES) 31G X 5 MM MISC 1 each by Does not apply route 4 (four) times daily.  Marland Kitchen JANUVIA 100 MG tablet TAKE 1 TABLET DAILY  . Lancets (ACCU-CHEK MULTICLIX) lancets   . losartan (COZAAR) 25 MG tablet TAKE 1 TABLET DAILY  . metFORMIN (GLUCOPHAGE) 500 MG tablet TAKE 1 TABLET TWICE A DAY  . mometasone (ELOCON) 0.1 % cream Apply 1 application topically daily.  Marland Kitchen omeprazole (PRILOSEC) 20 MG capsule TAKE 1 CAPSULE DAILY  . SPIRIVA HANDIHALER 18 MCG inhalation capsule INHALE THE CONTENTS OF 1 CAPSULE DAILY  . traMADol (ULTRAM) 50 MG tablet Take 1  tablet by mouth daily as needed.  . Vitamin D, Ergocalciferol, (DRISDOL) 50000 units CAPS capsule TAKE 1 CAPSULE TWICE WEEKLY  . gabapentin (NEURONTIN) 300 MG capsule TAKE 1 CAPSULE BY MOUTH AT BEDTIME (Patient not taking: Reported on 01/03/2017)  . glucose blood (ONE TOUCH ULTRA TEST) test strip Use 4 times per day to test blood sugar.  . [DISCONTINUED] meloxicam (MOBIC) 15 MG tablet    No facility-administered encounter medications on file as of 01/03/2017.     Activities of Daily Living In your present state of health, do you have any difficulty performing the following activities: 01/03/2017 12/31/2016  Hearing? N N  Vision? N N  Difficulty concentrating or making decisions? N N  Walking or climbing stairs? N Y  Dressing or bathing? N N  Doing errands, shopping? N N  Preparing Food and eating ? N -  Using the  Toilet? N -  In the past six months, have you accidently leaked urine? N -  Do you have problems with loss of bowel control? N -  Managing your Medications? N -  Managing your Finances? N -  Housekeeping or managing your Housekeeping? N -  Some recent data might be hidden    Patient Care Team: Glean Hess, MD as PCP - General (Family Medicine) Oh, Lupita Dawn, MD (Inactive) as Physician Assistant (Internal Medicine) Minna Merritts, MD as Consulting Physician (Cardiology) Anell Barr, OD (Optometry)    Assessment:     Exercise Activities and Dietary recommendations Current Exercise Habits: Home exercise routine, Type of exercise: stretching, Time (Minutes): 10, Frequency (Times/Week): 7, Weekly Exercise (Minutes/Week): 70, Intensity: Mild, Exercise limited by: orthopedic condition(s)  Goals    None     Fall Risk Fall Risk  01/03/2017 12/31/2016 12/10/2015 12/19/2014  Falls in the past year? No No No No   Depression Screen PHQ 2/9 Scores 01/03/2017 12/31/2016 12/10/2015 12/19/2014  PHQ - 2 Score 0 0 0 0     Cognitive Function     6CIT Screen 01/03/2017  What Year? 0 points  What month? 0 points  What time? 0 points  Count back from 20 0 points  Months in reverse 0 points  Repeat phrase 0 points  Total Score 0    Immunization History  Administered Date(s) Administered  . Influenza,inj,Quad PF,6+ Mos 01/02/2015, 12/31/2016  . Pneumococcal Conjugate-13 03/15/2014  . Pneumococcal Polysaccharide-23 04/27/2008, 12/31/2016  . Zoster 04/27/2008   Screening Tests Health Maintenance  Topic Date Due  . MAMMOGRAM  01/24/2017 (Originally 08/25/2015)  . DEXA SCAN  12/25/2017 (Originally 03/09/2010)  . HEMOGLOBIN A1C  06/30/2017  . OPHTHALMOLOGY EXAM  11/01/2017  . FOOT EXAM  12/31/2017  . COLONOSCOPY  04/27/2021  . TETANUS/TDAP  12/25/2021  . INFLUENZA VACCINE  Completed  . Hepatitis C Screening  Completed  . PNA vac Low Risk Adult  Completed      Plan:     I have  personally reviewed and addressed the Medicare Annual Wellness questionnaire and have noted the following in the patient's chart:  A. Medical and social history B. Use of alcohol, tobacco or illicit drugs  C. Current medications and supplements D. Functional ability and status E.  Nutritional status F.  Physical activity G. Advance directives H. List of other physicians I.  Hospitalizations, surgeries, and ER visits in previous 12 months J.  [place] such as hearing and vision if needed, cognitive and depression L. Referrals and appointments  In addition, I have reviewed and  discussed with patient certain preventive protocols, quality metrics, and best practice recommendations. A written personalized care plan for preventive services as well as general preventive health recommendations were provided to patient.   Signed,  Tyler Aas, LPN Nurse Health Advisor   MD Recommendations:none

## 2017-01-10 ENCOUNTER — Other Ambulatory Visit: Payer: Self-pay | Admitting: Internal Medicine

## 2017-01-11 ENCOUNTER — Other Ambulatory Visit: Payer: Self-pay | Admitting: Internal Medicine

## 2017-02-04 ENCOUNTER — Ambulatory Visit (INDEPENDENT_AMBULATORY_CARE_PROVIDER_SITE_OTHER): Payer: Medicare Other | Admitting: Vascular Surgery

## 2017-02-04 ENCOUNTER — Encounter (INDEPENDENT_AMBULATORY_CARE_PROVIDER_SITE_OTHER): Payer: Self-pay | Admitting: Vascular Surgery

## 2017-02-04 ENCOUNTER — Ambulatory Visit (INDEPENDENT_AMBULATORY_CARE_PROVIDER_SITE_OTHER): Payer: Medicare Other

## 2017-02-04 VITALS — BP 185/90 | HR 70 | Resp 16 | Ht 62.0 in | Wt 255.0 lb

## 2017-02-04 DIAGNOSIS — I723 Aneurysm of iliac artery: Secondary | ICD-10-CM | POA: Diagnosis not present

## 2017-02-04 DIAGNOSIS — I714 Abdominal aortic aneurysm, without rupture, unspecified: Secondary | ICD-10-CM

## 2017-02-04 DIAGNOSIS — E785 Hyperlipidemia, unspecified: Secondary | ICD-10-CM

## 2017-02-04 DIAGNOSIS — E1169 Type 2 diabetes mellitus with other specified complication: Secondary | ICD-10-CM

## 2017-02-04 NOTE — Progress Notes (Signed)
Subjective:    Patient ID: Molly Hoover, female    DOB: 07/04/1945, 71 y.o.   MRN: [id number] Chief Complaint  Patient presents with  . Follow-up    26mo aorta iliac   Patient presents for a yearly abdominal aortic aneurysm follow-up. She presents today without complaint patient denies any abdominal pain, back pain or thrombosis to the lower extremities. Patient denies any claudication-like symptoms, rest pain or ulceration to the lower extremity. The patient underwent an abdominal aortic duplex exam which was limited due to overlying bowel gas patterns and patient body habitus. The visualized vasculature was only obtained with the patient in the left lateral decubitus position. No hemodynamically significant velocity increase noted in the visualized abdominal aorta or bilateral proximal common iliac arteries. Normal biphasic flow noted in the bilateral proximal common and distal external external iliac arteries. No evidence of aneurysmal dilatation in the distal abdominal aorta or bilateral proximal/mid common. When compared to the previous ultrasound on 12/15/2015 maximum diameters of the bilateral common iliac arteries are less than previously recorded. No aneurysm was noted on the previous CTA from 12/19/2014. Distal aorta proximal / mid were not visualized measures 2.4 cm x 2.2 cm. Right common iliac artery 1.4cm x 1.3cm. Left common iliac artery 1.4cm x 1.4cm. Patient denies any fever, nausea or vomiting.   Review of Systems  Constitutional: Negative.   HENT: Negative.   Eyes: Negative.   Respiratory: Negative.   Cardiovascular: Negative.   Gastrointestinal: Negative.   Endocrine: Negative.   Genitourinary: Negative.   Musculoskeletal: Negative.   Skin: Negative.   Allergic/Immunologic: Negative.   Neurological: Negative.   Hematological: Negative.   Psychiatric/Behavioral: Negative.       Objective:   Physical Exam  Constitutional: She is oriented to person, place, and  time. She appears well-developed and well-nourished. No distress.  Obese.  HENT:  Head: Normocephalic and atraumatic.  Eyes: Pupils are equal, round, and reactive to light. Conjunctivae are normal.  Neck: Normal range of motion.  Cardiovascular: Normal rate, regular rhythm, normal heart sounds and intact distal pulses.   Pulses:      Radial pulses are 2+ on the right side, and 2+ on the left side.  Hard to palpate pedal pulses due to body habitus however her bilateral feet are warm.  Pulmonary/Chest: Effort normal.  Musculoskeletal: Normal range of motion. She exhibits edema (mild bilateral lower extremity edema).  Neurological: She is alert and oriented to person, place, and time.  Skin: Skin is warm and dry. She is not diaphoretic.  Psychiatric: She has a normal mood and affect. Her behavior is normal. Judgment and thought content normal.  Vitals reviewed.  BP (!) 185/90 (BP Location: Right Arm)   Pulse 70   Resp 16   Ht 5\' 2"  (1.575 m)   Wt 255 lb (115.7 kg)   BMI 46.64 kg/m   Past Medical History:  Diagnosis Date  . Allergic rhinitis   . Anxiety   . Asthma   . Bilateral breast cysts   . COPD (chronic obstructive pulmonary disease) ([place])   . Degenerative disc disease   . Depression   . Diabetes mellitus without complication ([place])   . Diabetic peripheral neuropathy ([place])   . Diverticulosis   . Enlarged heart   . GERD (gastroesophageal reflux disease)   . Hiatal hernia   . Hyperlipidemia   . Hypertension   . Hypothyroidism   . PVD (peripheral vascular disease) ([place])   . Syncope and collapse   .  Vitamin D deficiency    Social History   Social History  . Marital status: Married    Spouse name: N/A  . Number of children: N/A  . Years of education: N/A   Occupational History  . Not on file.   Social History Main Topics  . Smoking status: Former Smoker    Years: 15.00    Types: Cigarettes  . Smokeless tobacco: Never Used  . Alcohol use 1.2 oz/week    2  Standard drinks or equivalent per week     Comment: occasional  . Drug use: No  . Sexual activity: No   Other Topics Concern  . Not on file   Social History Narrative  . No narrative on file   Past Surgical History:  Procedure Laterality Date  . BREAST BIOPSY    . CARPAL TUNNEL RELEASE     right  . CERVICAL FUSION  2002  . CHOLECYSTECTOMY    . ESOPHAGOGASTRODUODENOSCOPY  03/2014   benign stricture dilated  . HERNIA REPAIR    . LUMBAR FUSION     L1-4   . 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 TOTAL ABDOMINAL HYSTERECTOMY     Family History  Problem Relation Age of Onset  . Heart attack Mother 7  . Hypertension Mother   . Heart attack Father    Allergies  Allergen Reactions  . Codeine Hives and Itching  . Combivent [Ipratropium-Albuterol] Itching  . Ivp Dye [Iodinated Diagnostic Agents] Hives and Itching  . Meloxicam       Assessment & Plan:  Patient presents for a yearly abdominal aortic aneurysm follow-up. She presents today without complaint patient denies any abdominal pain, back pain or thrombosis to the lower extremities. Patient denies any claudication-like symptoms, rest pain or ulceration to the lower extremity. The patient underwent an abdominal aortic duplex exam which was limited due to overlying bowel gas patterns and patient body habitus. The visualized vasculature was only obtained with the patient in the left lateral decubitus position. No hemodynamically significant velocity increase noted in the visualized abdominal aorta or bilateral proximal common iliac arteries. Normal biphasic flow noted in the bilateral proximal common and distal external external iliac arteries. No evidence of aneurysmal dilatation in the distal abdominal aorta or bilateral proximal/mid common. When compared to the previous ultrasound on 12/15/2015 maximum diameters of the bilateral common iliac arteries are less than previously recorded. No aneurysm was noted on the previous CTA  from 12/19/2014. Distal aorta proximal / mid were not visualized measures 2.4 cm x 2.2 cm. Right common iliac artery 1.4cm x 1.3cm. Left common iliac artery 1.4cm x 1.4cm. Patient denies any fever, nausea or vomiting.  1. Abdominal aortic aneurysm (AAA) without rupture (HCC) - Stable Abdominal ultrasound today with limited visualization due to overlying bowel gas patterns however no abdominal aneurysm was seen. Patient is a symptomatic. Physical exam is unremarkable The patient's blood pressure is not adequately controlled. I have reviewed the importance of hypertension and lipid control and the importance of continuing his abstinence from tobacco.  The patient is also encouraged to exercise a minimum of 30 minutes 4 times a week.  Patient was encouraged to follow-up with her primary care physician or cardiologist. Should the patient develop new onset abdominal or back pain or signs of peripheral embolization they are instructed to seek medical attention immediately and to alert the physician providing care that they have an aneurysm.  The patient voices their understanding.  - VAS US AORTA/IVC/ILIACS; Future  2. Iliac artery aneurysm, bilateral (HCC) - stable No iliac aneurysms noted on duplex today. Patient states she was told she has aneurysms in her "legs". I have a feeling she may be confusing this with her iliac arteries which do not have aneurysms which we imaged today on duplex. I will order a bilateral lower extremity arterial duplex to rule out any aneurysmal disease as the patient states she believes she has aneurysms in her lower extremity. She is asymptomatic at time Physical exam is unremarkable Patient follow-up in 1 year  - VAS US AORTA/IVC/ILIACS; Future - VAS [place] LOWER EXTREMITY ARTERIAL DUPLEX; Future  3. Hyperlipidemia associated with type 2 diabetes mellitus ([place]) - Stable Encouraged good control as its slows the progression of atherosclerotic disease  Current  Outpatient Prescriptions on File Prior to Visit  Medication Sig Dispense Refill  . alendronate (FOSAMAX) 70 MG tablet TAKE 1 TABLET EVERY WEEK 12 tablet 3  . atorvastatin (LIPITOR) 20 MG tablet TAKE 1 TABLET AT BEDTIME 90 tablet 3  . Cranberry 500 MG CAPS Take 500 mg by mouth daily.     . diclofenac (VOLTAREN) 75 MG EC tablet     . DULERA 200-5 MCG/ACT AERO USE 1 INHALATION TWICE A DAY 39 g 3  . gemfibrozil (LOPID) 600 MG tablet TAKE 1 TABLET TWICE A DAY 180 tablet 1  . glucose blood (ONE TOUCH ULTRA TEST) test strip Use 4 times per day to test blood sugar. 150 each 12  . insulin aspart (NOVOLOG FLEXPEN) 100 UNIT/ML FlexPen Inject 30 Units into the skin 3 (three) times daily with meals. 30 mL 11  . Insulin Detemir (LEVEMIR FLEXTOUCH) 100 UNIT/ML Pen Inject 30 Units into the skin 2 (two) times daily. 30 mL 11  . Insulin Pen Needle (FIFTY50 PEN NEEDLES) 31G X 5 MM MISC 1 each by Does not apply route 4 (four) times daily. 400 each 3  . JANUVIA 100 MG tablet TAKE 1 TABLET DAILY 90 tablet 3  . Lancets (ACCU-CHEK MULTICLIX) lancets     . losartan (COZAAR) 25 MG tablet TAKE 1 TABLET DAILY 90 tablet 1  . metFORMIN (GLUCOPHAGE) 500 MG tablet TAKE 1 TABLET TWICE A DAY 180 tablet 1  . mometasone (ELOCON) 0.1 % cream Apply 1 application topically daily. 15 g 0  . omeprazole (PRILOSEC) 20 MG capsule TAKE 1 CAPSULE DAILY 90 capsule 3  . SPIRIVA HANDIHALER 18 MCG inhalation capsule INHALE THE CONTENTS OF 1 CAPSULE DAILY 90 capsule 1  . traMADol (ULTRAM) 50 MG tablet Take 1 tablet by mouth daily as needed.    . Vitamin D, Ergocalciferol, (DRISDOL) 50000 units CAPS capsule TAKE 1 CAPSULE TWICE WEEKLY 24 capsule 3  . gabapentin (NEURONTIN) 300 MG capsule TAKE 1 CAPSULE BY MOUTH AT BEDTIME (Patient not taking: Reported on 01/03/2017) 30 capsule 3   No current facility-administered medications on file prior to visit.    There are no Patient Instructions on file for this visit. No Follow-up on file.  Shannen Flansburg  A Joellen Tullos, PA-C

## 2017-02-10 ENCOUNTER — Other Ambulatory Visit: Payer: Self-pay | Admitting: Internal Medicine

## 2017-02-10 DIAGNOSIS — IMO0001 Reserved for inherently not codable concepts without codable children: Secondary | ICD-10-CM

## 2017-02-10 DIAGNOSIS — Z794 Long term (current) use of insulin: Principal | ICD-10-CM

## 2017-02-10 DIAGNOSIS — E1165 Type 2 diabetes mellitus with hyperglycemia: Principal | ICD-10-CM

## 2017-04-11 ENCOUNTER — Other Ambulatory Visit: Payer: Self-pay | Admitting: Internal Medicine

## 2017-04-11 DIAGNOSIS — Z794 Long term (current) use of insulin: Principal | ICD-10-CM

## 2017-04-11 DIAGNOSIS — E1165 Type 2 diabetes mellitus with hyperglycemia: Principal | ICD-10-CM

## 2017-04-11 DIAGNOSIS — IMO0001 Reserved for inherently not codable concepts without codable children: Secondary | ICD-10-CM

## 2017-04-24 ENCOUNTER — Other Ambulatory Visit: Payer: Self-pay | Admitting: Internal Medicine

## 2017-05-02 ENCOUNTER — Encounter: Payer: Self-pay | Admitting: Internal Medicine

## 2017-05-02 ENCOUNTER — Ambulatory Visit (INDEPENDENT_AMBULATORY_CARE_PROVIDER_SITE_OTHER): Payer: Medicare Other | Admitting: Internal Medicine

## 2017-05-02 VITALS — BP 128/70 | HR 71 | Ht 62.0 in | Wt 260.0 lb

## 2017-05-02 DIAGNOSIS — J449 Chronic obstructive pulmonary disease, unspecified: Secondary | ICD-10-CM | POA: Diagnosis not present

## 2017-05-02 DIAGNOSIS — E1165 Type 2 diabetes mellitus with hyperglycemia: Secondary | ICD-10-CM | POA: Diagnosis not present

## 2017-05-02 DIAGNOSIS — Z794 Long term (current) use of insulin: Secondary | ICD-10-CM | POA: Diagnosis not present

## 2017-05-02 DIAGNOSIS — I10 Essential (primary) hypertension: Secondary | ICD-10-CM

## 2017-05-02 DIAGNOSIS — E1169 Type 2 diabetes mellitus with other specified complication: Secondary | ICD-10-CM

## 2017-05-02 DIAGNOSIS — E785 Hyperlipidemia, unspecified: Secondary | ICD-10-CM

## 2017-05-02 DIAGNOSIS — IMO0001 Reserved for inherently not codable concepts without codable children: Secondary | ICD-10-CM

## 2017-05-02 MED ORDER — ONETOUCH DELICA LANCETS 33G MISC: 1.0000 | Freq: Two times a day (BID) | 5 refills | Status: DC

## 2017-05-02 NOTE — Progress Notes (Signed)
Date:  05/02/2017   Name:  Molly Hoover   DOB:  29-Dec-1945   MRN:  [id number]   Chief Complaint: Diabetes (BS- 300) and Hypertension Diabetes  She presents for her follow-up diabetic visit. She has type 2 diabetes mellitus. Her disease course has been worsening. Pertinent negatives for diabetes include no blurred vision, no chest pain, no fatigue, no foot ulcerations, no polydipsia, no polyuria and no weight loss. Symptoms are worsening. She monitors blood glucose at home 3-4 x per week. Her home blood glucose trend is increasing steadily. Her breakfast blood glucose range is generally >200 mg/dl. An ACE inhibitor/angiotensin II receptor blocker is being taken.  Hypertension  This is a chronic problem. The problem is controlled. Pertinent negatives include no blurred vision, chest pain, palpitations or shortness of breath.  Hyperlipidemia  This is a chronic problem. The problem is controlled. Pertinent negatives include no chest pain or shortness of breath. Current antihyperlipidemic treatment includes statins.  COPD - she reports doing well.  No recent infections, cough or change in shortness of breath.  She is using Dulera and Spiriva.  Immunizations are up to date. Back pain - spinal stenosis.  Stopped gabapentin due to no benefit.  Pain is unchanged.  She did not benefit from [facility].  Next step is surgery but she is not ready.  She continues to walk with a cane.  Lab Results  Component Value Date   HGBA1C 7.9 (H) 12/31/2016   Lab Results  Component Value Date   CHOL 184 12/10/2015   HDL 50 12/10/2015   LDLCALC 108 (H) 12/10/2015   TRIG 131 12/10/2015   CHOLHDL 3.7 12/10/2015     Review of Systems  Constitutional: Negative for chills, fatigue, fever and weight loss.  Eyes: Negative for blurred vision and visual disturbance.  Respiratory: Negative for cough, chest tightness and shortness of breath.   Cardiovascular: Negative for chest pain, palpitations and leg swelling.    Gastrointestinal: Negative for abdominal pain.  Endocrine: Negative for polydipsia and polyuria.  Genitourinary: Negative for difficulty urinating.  Musculoskeletal: Positive for back pain and gait problem.  Psychiatric/Behavioral: Negative for dysphoric mood and sleep disturbance.    Patient Active Problem List   Diagnosis Date Noted  . Mild non proliferative diabetic retinopathy ([place]) 01/01/2017  . Spinal stenosis of lumbar region 12/31/2016  . 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 Iliac artery aneurysm, bilateral ([place]) 12/15/2015  . Abdominal aortic aneurysm (AAA) without rupture ([place]) 12/11/2015  . Osteoarthritis of both knees 12/10/2015  . Uncontrolled type 2 diabetes mellitus without complication, with long-term current use of insulin ([place]) 04/03/2015  . COPD, moderate ([place]) 11/19/2014  . Edema extremities 11/19/2014  . H/O gastrointestinal disease 11/19/2014  . Hyperlipidemia associated with type 2 diabetes mellitus ([place]) 11/19/2014  . Muscle spasms of head and/or neck 11/19/2014  . Idiopathic osteoporosis 11/19/2014  . 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 Venous insufficiency of leg 11/19/2014  . Morbid obesity ([place]) 10/17/2013  . Essential hypertension 10/17/2013  . Chronic cough 10/17/2013    Prior to Admission medications   Medication Sig Start Date End Date Taking? Authorizing Provider  alendronate (FOSAMAX) 70 MG tablet TAKE 1 TABLET EVERY WEEK 07/21/16   Glean Hess, MD  atorvastatin (LIPITOR) 20 MG tablet TAKE 1 TABLET AT BEDTIME 04/24/17   Glean Hess, MD  B-D UF III MINI PEN NEEDLES 31G X 5 MM MISC USE 1 NEEDLE FOUR TIMES A DAY 02/10/17  Glean Hess, MD  Cranberry 500 MG CAPS Take 500 mg by mouth daily.     [provider]  diclofenac (VOLTAREN) 75 MG EC tablet  11/03/15   [provider]  DULERA 200-5 MCG/ACT AERO USE 1 INHALATION TWICE A DAY 08/13/16   Glean Hess, MD  gabapentin (NEURONTIN) 300 MG  capsule TAKE 1 CAPSULE BY MOUTH AT BEDTIME Patient not taking: Reported on 01/03/2017 11/19/16   Glean Hess, MD  gemfibrozil (LOPID) 600 MG tablet TAKE 1 TABLET TWICE A DAY 01/10/17   Glean Hess, MD  glucose blood (ONE TOUCH ULTRA TEST) test strip Use 4 times per day to test blood sugar. 01/21/16   Glean Hess, MD  HUMALOG KWIKPEN 100 UNIT/ML KiwkPen INJECT 30 UNITS INTO THE SKIN 3 TIMES DAILY WITH MEALS 04/11/17   Glean Hess, MD  JANUVIA 100 MG tablet TAKE 1 TABLET DAILY 04/24/17   Glean Hess, MD  Lancets ([place]) lancets     [provider]  LEVEMIR FLEXTOUCH 100 UNIT/ML Pen INJECT 30 UNITS INTO THE SKIN TWICE A DAY 04/11/17   Glean Hess, MD  losartan (COZAAR) 25 MG tablet TAKE 1 TABLET DAILY 01/10/17   Glean Hess, MD  metFORMIN (GLUCOPHAGE) 500 MG tablet TAKE 1 TABLET TWICE A DAY 11/12/16   Glean Hess, MD  mometasone (ELOCON) 0.1 % cream Apply 1 application topically daily. 03/15/16   Glean Hess, MD  omeprazole (PRILOSEC) 20 MG capsule TAKE 1 CAPSULE DAILY 10/26/16   Glean Hess, MD  [facility] HANDIHALER 18 MCG inhalation capsule INHALE THE CONTENTS OF 1 CAPSULE DAILY 01/11/17   Glean Hess, MD       [provider]  Vitamin D, Ergocalciferol, (DRISDOL) 50000 units CAPS capsule TAKE 1 CAPSULE TWICE WEEKLY 07/21/16   Glean Hess, MD    Allergies  Allergen Reactions  . Codeine Hives and Itching  . Combivent [Ipratropium-Albuterol] Itching  . Ivp Dye [Iodinated Diagnostic Agents] Hives and Itching  . Meloxicam     Past Surgical History:  Procedure Laterality Date  . BREAST BIOPSY    . CARPAL TUNNEL RELEASE     right  . CERVICAL FUSION  2002  . CHOLECYSTECTOMY    . ESOPHAGOGASTRODUODENOSCOPY  03/2014   benign stricture dilated  . HERNIA REPAIR    . LUMBAR FUSION     L1-4   . 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 TOTAL ABDOMINAL HYSTERECTOMY      Social History   Tobacco Use   . Smoking status: Former Smoker    Years: 15.00    Types: Cigarettes  . Smokeless tobacco: Never Used  Substance Use Topics  . Alcohol use: Yes    Alcohol/week: 1.2 oz    Types: 2 Standard drinks or equivalent per week    Comment: occasional  . Drug use: No     Medication list has been reviewed and updated.  PHQ 2/9 Scores 01/03/2017 12/31/2016 12/10/2015 12/19/2014  PHQ - 2 Score 0 0 0 0    Physical Exam  Constitutional: She is oriented to person, place, and time. She appears well-developed. No distress.  HENT:  Head: Normocephalic and atraumatic.  Eyes: Pupils are equal, round, and reactive to light.  Neck: Normal range of motion. Neck supple. Carotid bruit is not present. No thyromegaly present.  Cardiovascular: Normal rate, regular rhythm and normal heart sounds.  No murmur heard. Pulmonary/Chest: Effort normal and breath sounds normal.  No respiratory distress. She has no wheezes.  Musculoskeletal: She exhibits no edema.  Neurological: She is alert and oriented to person, place, and time.  Skin: Skin is warm and dry. No rash noted.  Psychiatric: She has a normal mood and affect. Her speech is normal and behavior is normal. Thought content normal.  Nursing note and vitals reviewed.   BP 128/70   Pulse 71   Ht 5\' 2"  (1.575 m)   Wt 260 lb (117.9 kg)   SpO2 99%   BMI 47.55 kg/m   Assessment and Plan: 1. Uncontrolled type 2 diabetes mellitus without complication, with long-term current use of insulin ([place]) Not controlled due to dietary indiscretion - encouraged to work harder on diet since the holidays have passed - Hemoglobin A1c - Comprehensive metabolic panel - ONETOUCH DELICA LANCETS 73A MISC; 1 each by Does not apply route 2 (two) times daily.  Dispense: 100 each; Refill: 5  2. Hyperlipidemia associated with type 2 diabetes mellitus ([place]) On statin therapy  3. Essential hypertension controlled - CBC with Differential/Platelet  4. COPD, moderate  ([place]) Controlled on triple therapy   Meds ordered this encounter  Medications  . ONETOUCH DELICA LANCETS 19F MISC    Sig: 1 each by Does not apply route 2 (two) times daily.    Dispense:  100 each    Refill:  5    Dx E11.69    Partially dictated using Editor, commissioning. Any errors are unintentional.  Halina Maidens, MD [facility]  05/02/2017

## 2017-05-03 ENCOUNTER — Telehealth: Payer: Self-pay

## 2017-05-03 LAB — COMPREHENSIVE METABOLIC PANEL
ALT: 24 IU/L (ref 0–32)
AST: 22 IU/L (ref 0–40)
Albumin/Globulin Ratio: 1.9 (ref 1.2–2.2)
Albumin: 4.3 g/dL (ref 3.5–4.8)
Alkaline Phosphatase: 72 IU/L (ref 39–117)
BUN/Creatinine Ratio: 19 (ref 12–28)
BUN: 14 mg/dL (ref 8–27)
Bilirubin Total: 0.7 mg/dL (ref 0.0–1.2)
CALCIUM: 9.4 mg/dL (ref 8.7–10.3)
CO2: 22 mmol/L (ref 20–29)
CREATININE: 0.72 mg/dL (ref 0.57–1.00)
Chloride: 98 mmol/L (ref 96–106)
GFR calc Af Amer: 97 mL/min/{1.73_m2} (ref >59)
GFR, EST NON AFRICAN AMERICAN: 84 mL/min/{1.73_m2} (ref >59)
GLOBULIN, TOTAL: 2.3 g/dL (ref 1.5–4.5)
Glucose: 343 mg/dL — ABNORMAL HIGH (ref 65–99)
Potassium: 5.1 mmol/L (ref 3.5–5.2)
Sodium: 137 mmol/L (ref 134–144)
TOTAL PROTEIN: 6.6 g/dL (ref 6.0–8.5)

## 2017-05-03 LAB — CBC WITH DIFFERENTIAL/PLATELET
Basophils Absolute: 0 10*3/uL (ref 0.0–0.2)
Basos: 1 %
EOS (ABSOLUTE): 0.1 10*3/uL (ref 0.0–0.4)
Eos: 2 %
HEMATOCRIT: 40.8 % (ref 34.0–46.6)
Hemoglobin: 13 g/dL (ref 11.1–15.9)
IMMATURE GRANS (ABS): 0 10*3/uL (ref 0.0–0.1)
IMMATURE GRANULOCYTES: 0 %
LYMPHS: 22 %
Lymphocytes Absolute: 1.1 10*3/uL (ref 0.7–3.1)
MCH: 28.5 pg (ref 26.6–33.0)
MCHC: 31.9 g/dL (ref 31.5–35.7)
MCV: 90 fL (ref 79–97)
MONOS ABS: 0.3 10*3/uL (ref 0.1–0.9)
Monocytes: 7 %
NEUTROS PCT: 68 %
Neutrophils Absolute: 3.4 10*3/uL (ref 1.4–7.0)
PLATELETS: 192 10*3/uL (ref 150–379)
RBC: 4.56 x10E6/uL (ref 3.77–5.28)
RDW: 14.3 % (ref 12.3–15.4)
WBC: 4.9 10*3/uL (ref 3.4–10.8)

## 2017-05-03 LAB — HEMOGLOBIN A1C
Est. average glucose Bld gHb Est-mCnc: 258 mg/dL
Hgb A1c MFr Bld: 10.6 % — ABNORMAL HIGH (ref 4.8–5.6)

## 2017-05-03 NOTE — Telephone Encounter (Signed)
Patient called saying she is coughing with congestion. Wanted something called into pharmacy. Informed her we have to see her for the problem before we can send in medication.  She verbalized understanding and I told her she can try sudafed for congestion and Delsym for the cough OTC.

## 2017-05-05 ENCOUNTER — Ambulatory Visit (INDEPENDENT_AMBULATORY_CARE_PROVIDER_SITE_OTHER): Payer: Medicare Other | Admitting: Internal Medicine

## 2017-05-05 ENCOUNTER — Encounter: Payer: Self-pay | Admitting: Internal Medicine

## 2017-05-05 VITALS — BP 118/64 | HR 80 | Temp 98.1°F | Ht 62.0 in | Wt 260.0 lb

## 2017-05-05 DIAGNOSIS — J40 Bronchitis, not specified as acute or chronic: Secondary | ICD-10-CM

## 2017-05-05 MED ORDER — AZITHROMYCIN 250 MG PO TABS: ORAL_TABLET | ORAL | 0 refills | Status: DC

## 2017-05-05 MED ORDER — BENZONATATE 100 MG PO CAPS: 100.0000 mg | ORAL_CAPSULE | Freq: Three times a day (TID) | ORAL | 0 refills | Status: AC

## 2017-05-05 NOTE — Progress Notes (Signed)
Date:  05/05/2017   Name:  Molly Hoover   DOB:  10-19-1945   MRN:  [id number]   Chief Complaint: Cough (Cough with congestion. Sore throat. Started 2 days ago. Started with yellow production and now its green/brown color. No fever- cold chills and aching all over.  )  Cough  This is a new problem. The current episode started in the past 7 days. The problem has been gradually worsening. The problem occurs every few minutes. The cough is productive of sputum. Associated symptoms include chills, a sore throat, shortness of breath, sweats and wheezing. Pertinent negatives include no chest pain, fever, headaches or hemoptysis.  '  Review of Systems  Constitutional: Positive for chills. Negative for fever.  HENT: Positive for sore throat.   Respiratory: Positive for cough, shortness of breath and wheezing. Negative for hemoptysis.   Cardiovascular: Negative for chest pain and palpitations.  Gastrointestinal: Negative for abdominal pain, diarrhea and vomiting.  Neurological: Negative for dizziness, syncope and headaches.    Patient Active Problem List   Diagnosis Date Noted  . Mild non proliferative diabetic retinopathy ([place]) 01/01/2017  . Spinal stenosis of lumbar region 12/31/2016  . 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 Iliac artery aneurysm, bilateral ([place]) 12/15/2015  . Abdominal aortic aneurysm (AAA) without rupture ([place]) 12/11/2015  . Osteoarthritis of both knees 12/10/2015  . Uncontrolled type 2 diabetes mellitus without complication, with long-term current use of insulin ([place]) 04/03/2015  . COPD, moderate ([place]) 11/19/2014  . Edema extremities 11/19/2014  . H/O gastrointestinal disease 11/19/2014  . Hyperlipidemia associated with type 2 diabetes mellitus ([place]) 11/19/2014  . Muscle spasms of head and/or neck 11/19/2014  . Idiopathic osteoporosis 11/19/2014  . 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 Venous insufficiency of leg 11/19/2014  . Morbid obesity  ([place]) 10/17/2013  . Essential hypertension 10/17/2013  . Chronic cough 10/17/2013    Prior to Admission medications   Medication Sig Start Date End Date Taking? Authorizing Provider  alendronate (FOSAMAX) 70 MG tablet TAKE 1 TABLET EVERY WEEK 07/21/16  Yes Glean Hess, MD  atorvastatin (LIPITOR) 20 MG tablet TAKE 1 TABLET AT BEDTIME 04/24/17  Yes Glean Hess, MD  B-D UF III MINI PEN NEEDLES 31G X 5 MM MISC USE 1 NEEDLE FOUR TIMES A DAY 02/10/17  Yes Glean Hess, MD  Cranberry 500 MG CAPS Take 500 mg by mouth daily.    Yes [provider]  diclofenac (VOLTAREN) 75 MG EC tablet  11/03/15  Yes [provider]  DULERA 200-5 MCG/ACT AERO USE 1 INHALATION TWICE A DAY 08/13/16  Yes Glean Hess, MD  gemfibrozil (LOPID) 600 MG tablet TAKE 1 TABLET TWICE A DAY 01/10/17  Yes Glean Hess, MD  glucose blood (ONE TOUCH ULTRA TEST) test strip Use 4 times per day to test blood sugar. 01/21/16  Yes Glean Hess, MD  HUMALOG KWIKPEN 100 UNIT/ML KiwkPen INJECT 30 UNITS INTO THE SKIN 3 TIMES DAILY WITH MEALS 04/11/17  Yes Glean Hess, MD  JANUVIA 100 MG tablet TAKE 1 TABLET DAILY 04/24/17  Yes Glean Hess, MD  LEVEMIR FLEXTOUCH 100 UNIT/ML Pen INJECT 30 UNITS INTO THE SKIN TWICE A DAY 04/11/17  Yes Glean Hess, MD  losartan (COZAAR) 25 MG tablet TAKE 1 TABLET DAILY 01/10/17  Yes Glean Hess, MD  metFORMIN (GLUCOPHAGE) 500 MG tablet TAKE 1 TABLET TWICE A DAY 11/12/16  Yes Glean Hess, MD  mometasone [place])  0.1 % cream Apply 1 application topically daily. 03/15/16  Yes Glean Hess, MD  omeprazole (PRILOSEC) 20 MG capsule TAKE 1 CAPSULE DAILY 10/26/16  Yes Glean Hess, MD  [facility] DELICA LANCETS 14E MISC 1 each by Does not apply route 2 (two) times daily. 05/02/17  Yes Glean Hess, MD  SPIRIVA HANDIHALER 18 MCG inhalation capsule INHALE THE CONTENTS OF 1 CAPSULE DAILY 01/11/17  Yes Glean Hess, MD  Vitamin D,  Ergocalciferol, (DRISDOL) 50000 units CAPS capsule TAKE 1 CAPSULE TWICE WEEKLY 07/21/16  Yes Glean Hess, MD    Allergies  Allergen Reactions  . Codeine Hives and Itching  . Combivent [Ipratropium-Albuterol] Itching  . Ivp Dye [Iodinated Diagnostic Agents] Hives and Itching  . Meloxicam     Past Surgical History:  Procedure Laterality Date  . BREAST BIOPSY    . CARPAL TUNNEL RELEASE     right  . CERVICAL FUSION  2002  . CHOLECYSTECTOMY    . ESOPHAGOGASTRODUODENOSCOPY  03/2014   benign stricture dilated  . HERNIA REPAIR    . LUMBAR FUSION     L1-4   . 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 TOTAL ABDOMINAL HYSTERECTOMY      Social History   Tobacco Use  . Smoking status: Former Smoker    Years: 15.00    Types: Cigarettes  . Smokeless tobacco: Never Used  Substance Use Topics  . Alcohol use: Yes    Alcohol/week: 1.2 oz    Types: 2 Standard drinks or equivalent per week    Comment: occasional  . Drug use: No     Medication list has been reviewed and updated.  PHQ 2/9 Scores 01/03/2017 12/31/2016 12/10/2015 12/19/2014  PHQ - 2 Score 0 0 0 0    Physical Exam  Constitutional: She is oriented to person, place, and time. She appears well-developed. No distress.  HENT:  Head: Normocephalic and atraumatic.  Right Ear: Tympanic membrane and ear canal normal.  Left Ear: Tympanic membrane and ear canal normal.  Mouth/Throat: Posterior oropharyngeal erythema present. No posterior oropharyngeal edema.  Neck: Normal range of motion. Neck supple.  Cardiovascular: Normal rate, regular rhythm and normal heart sounds.  Pulmonary/Chest: Effort normal. No accessory muscle usage. No respiratory distress. She has wheezes in the right upper field and the left upper field.  Musculoskeletal: Normal range of motion.  Neurological: She is alert and oriented to person, place, and time.  Skin: Skin is warm and dry. No rash noted.  Psychiatric: She has a normal mood and affect. Her  behavior is normal. Thought content normal.  Nursing note and vitals reviewed.   BP 118/64   Pulse 80   Temp 98.1 F (36.7 C) (Oral)   Ht 5\' 2"  (1.575 m)   Wt 260 lb (117.9 kg)   SpO2 96%   BMI 47.55 kg/m   Assessment and Plan: 1. Bronchitis Continue Robitussin DM Fluids, rest - azithromycin (ZITHROMAX Z-PAK) 250 MG tablet; UAD  Dispense: 6 each; Refill: 0 - benzonatate (TESSALON) 100 MG capsule; Take 1 capsule (100 mg total) by mouth 3 (three) times daily for 10 days.  Dispense: 30 capsule; Refill: 0   Meds ordered this encounter  Medications  . azithromycin (ZITHROMAX Z-PAK) 250 MG tablet    Sig: UAD    Dispense:  6 each    Refill:  0  . benzonatate (TESSALON) 100 MG capsule    Sig: Take 1 capsule (100 mg total) by mouth 3 (three) times daily  for 10 days.    Dispense:  30 capsule    Refill:  0    Partially dictated using Editor, commissioning. Any errors are unintentional.  Halina Maidens, MD [facility]  05/05/2017

## 2017-05-10 ENCOUNTER — Other Ambulatory Visit: Payer: Self-pay

## 2017-05-10 MED ORDER — TIOTROPIUM BROMIDE MONOHYDRATE 18 MCG IN CAPS: 1.0000 | ORAL_CAPSULE | Freq: Every day | RESPIRATORY_TRACT | 1 refills | Status: DC

## 2017-05-11 ENCOUNTER — Other Ambulatory Visit: Payer: Self-pay | Admitting: Internal Medicine

## 2017-06-07 ENCOUNTER — Encounter: Payer: Self-pay | Admitting: Internal Medicine

## 2017-06-07 ENCOUNTER — Ambulatory Visit (INDEPENDENT_AMBULATORY_CARE_PROVIDER_SITE_OTHER): Payer: Medicare Other | Admitting: Internal Medicine

## 2017-06-07 VITALS — BP 112/70 | HR 87 | Temp 98.0°F | Ht 62.0 in | Wt 259.0 lb

## 2017-06-07 DIAGNOSIS — J449 Chronic obstructive pulmonary disease, unspecified: Secondary | ICD-10-CM | POA: Diagnosis not present

## 2017-06-07 DIAGNOSIS — J189 Pneumonia, unspecified organism: Secondary | ICD-10-CM

## 2017-06-07 DIAGNOSIS — J181 Lobar pneumonia, unspecified organism: Secondary | ICD-10-CM

## 2017-06-07 MED ORDER — CEFDINIR 300 MG PO CAPS: 300.0000 mg | ORAL_CAPSULE | Freq: Two times a day (BID) | ORAL | 0 refills | Status: DC

## 2017-06-07 MED ORDER — BENZONATATE 100 MG PO CAPS: 100.0000 mg | ORAL_CAPSULE | Freq: Three times a day (TID) | ORAL | 0 refills | Status: DC

## 2017-06-07 MED ORDER — ALBUTEROL SULFATE HFA 108 (90 BASE) MCG/ACT IN AERS: 2.0000 | INHALATION_SPRAY | Freq: Four times a day (QID) | RESPIRATORY_TRACT | 0 refills | Status: DC | PRN

## 2017-06-07 NOTE — Progress Notes (Signed)
Date:  06/07/2017   Name:  Molly Hoover   DOB:  11/23/1945   MRN:  [id number]   Chief Complaint: Cough (Started Saturday. Cough/ wheezing- with yellow production. Scratchy throat. Diarrhea started last night. BS is elevated. No fever. )  Cough  This is a new problem. The current episode started in the past 7 days. The problem has been gradually worsening. The problem occurs every few minutes. The cough is productive of sputum. Associated symptoms include chills, postnasal drip, a sore throat, shortness of breath and wheezing. Pertinent negatives include no chest pain, fever or headaches. She has tried OTC cough suppressant for the symptoms. The treatment provided no relief. Her past medical history is significant for COPD.     Review of Systems  Constitutional: Positive for chills and fatigue. Negative for fever.  HENT: Positive for postnasal drip and sore throat.   Respiratory: Positive for cough, shortness of breath and wheezing.   Cardiovascular: Negative for chest pain and palpitations.  Gastrointestinal: Positive for diarrhea. Negative for abdominal pain and vomiting.  Neurological: Negative for headaches.    Patient Active Problem List   Diagnosis Date Noted  . Mild non proliferative diabetic retinopathy ([place]) 01/01/2017  . Spinal stenosis of lumbar region 12/31/2016  . 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 Iliac artery aneurysm, bilateral ([place]) 12/15/2015  . Abdominal aortic aneurysm (AAA) without rupture ([place]) 12/11/2015  . Osteoarthritis of both knees 12/10/2015  . Uncontrolled type 2 diabetes mellitus without complication, with long-term current use of insulin ([place]) 04/03/2015  . COPD, moderate ([place]) 11/19/2014  . Edema extremities 11/19/2014  . H/O gastrointestinal disease 11/19/2014  . Hyperlipidemia associated with type 2 diabetes mellitus ([place]) 11/19/2014  . Muscle spasms of head and/or neck 11/19/2014  . Idiopathic osteoporosis 11/19/2014  .  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 Venous insufficiency of leg 11/19/2014  . Morbid obesity ([place]) 10/17/2013  . Essential hypertension 10/17/2013  . Chronic cough 10/17/2013    Prior to Admission medications   Medication Sig Start Date End Date Taking? Authorizing Provider  alendronate (FOSAMAX) 70 MG tablet TAKE 1 TABLET EVERY WEEK 07/21/16  Yes Glean Hess, MD  atorvastatin (LIPITOR) 20 MG tablet TAKE 1 TABLET AT BEDTIME 04/24/17  Yes Glean Hess, MD  azithromycin (ZITHROMAX Z-PAK) 250 MG tablet UAD 05/05/17  Yes Glean Hess, MD  B-D UF III MINI PEN NEEDLES 31G X 5 MM MISC USE 1 NEEDLE FOUR TIMES A DAY 02/10/17  Yes Glean Hess, MD  Cranberry 500 MG CAPS Take 500 mg by mouth daily.    Yes [provider]  diclofenac (VOLTAREN) 75 MG EC tablet  11/03/15  Yes [provider]  DULERA 200-5 MCG/ACT AERO USE 1 INHALATION TWICE A DAY 08/13/16  Yes Glean Hess, MD  gemfibrozil (LOPID) 600 MG tablet TAKE 1 TABLET TWICE A DAY 01/10/17  Yes Glean Hess, MD  glucose blood (ONE TOUCH ULTRA TEST) test strip Use 4 times per day to test blood sugar. 01/21/16  Yes Glean Hess, MD  HUMALOG KWIKPEN 100 UNIT/ML KiwkPen INJECT 30 UNITS INTO THE SKIN 3 TIMES DAILY WITH MEALS 04/11/17  Yes Glean Hess, MD  JANUVIA 100 MG tablet TAKE 1 TABLET DAILY 04/24/17  Yes Glean Hess, MD  LEVEMIR FLEXTOUCH 100 UNIT/ML Pen INJECT 30 UNITS INTO THE SKIN TWICE A DAY 04/11/17  Yes Glean Hess, MD  losartan (COZAAR) 25 MG tablet TAKE 1 TABLET  DAILY 01/10/17  Yes Glean Hess, MD  metFORMIN (GLUCOPHAGE) 500 MG tablet TAKE 1 TABLET TWICE A DAY 05/11/17  Yes Glean Hess, MD  mometasone (ELOCON) 0.1 % cream Apply 1 application topically daily. 03/15/16  Yes Glean Hess, MD  omeprazole (PRILOSEC) 20 MG capsule TAKE 1 CAPSULE DAILY 10/26/16  Yes Glean Hess, MD  [facility] DELICA LANCETS 24M MISC 1 each by Does not apply route 2 (two)  times daily. 05/02/17  Yes Glean Hess, MD  tiotropium (SPIRIVA HANDIHALER) 18 MCG inhalation capsule Place 1 capsule (18 mcg total) into inhaler and inhale daily. 05/10/17  Yes Glean Hess, MD  Vitamin D, Ergocalciferol, (DRISDOL) 50000 units CAPS capsule TAKE 1 CAPSULE TWICE WEEKLY 07/21/16  Yes Glean Hess, MD    Allergies  Allergen Reactions  . Codeine Hives and Itching  . Combivent [Ipratropium-Albuterol] Itching  . Ivp Dye [Iodinated Diagnostic Agents] Hives and Itching  . Meloxicam     Past Surgical History:  Procedure Laterality Date  . BREAST BIOPSY    . CARPAL TUNNEL RELEASE     right  . CERVICAL FUSION  2002  . CHOLECYSTECTOMY    . ESOPHAGOGASTRODUODENOSCOPY  03/2014   benign stricture dilated  . HERNIA REPAIR    . LUMBAR FUSION     L1-4   . 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 TOTAL ABDOMINAL HYSTERECTOMY      Social History   Tobacco Use  . Smoking status: Former Smoker    Years: 15.00    Types: Cigarettes  . Smokeless tobacco: Never Used  Substance Use Topics  . Alcohol use: Yes    Alcohol/week: 1.2 oz    Types: 2 Standard drinks or equivalent per week    Comment: occasional  . Drug use: No     Medication list has been reviewed and updated.  PHQ 2/9 Scores 01/03/2017 12/31/2016 12/10/2015 12/19/2014  PHQ - 2 Score 0 0 0 0    Physical Exam  Constitutional: She is oriented to person, place, and time. She appears well-developed. No distress.  HENT:  Head: Normocephalic and atraumatic.  Right Ear: Tympanic membrane and ear canal normal.  Left Ear: Tympanic membrane and ear canal normal.  Nose: Right sinus exhibits no maxillary sinus tenderness. Left sinus exhibits no maxillary sinus tenderness.  Mouth/Throat: Posterior oropharyngeal erythema present. No oropharyngeal exudate or posterior oropharyngeal edema.  Cardiovascular: Normal rate, regular rhythm and normal heart sounds.  Pulmonary/Chest: Effort normal. No respiratory distress.  She has decreased breath sounds. She has rhonchi in the right upper field.  Musculoskeletal: Normal range of motion.  Neurological: She is alert and oriented to person, place, and time.  Skin: Skin is warm and dry. No rash noted.  Psychiatric: She has a normal mood and affect. Her speech is normal and behavior is normal. Thought content normal.  Nursing note and vitals reviewed.   BP 112/70   Pulse 87   Temp 98 F (36.7 C) (Oral)   Ht 5\' 2"  (1.575 m)   Wt 259 lb (117.5 kg)   SpO2 98%   BMI 47.37 kg/m   Assessment and Plan: 1. Community acquired pneumonia of right upper lobe of lung ([place]) Add albuterol MDI - cefdinir (OMNICEF) 300 MG capsule; Take 1 capsule (300 mg total) by mouth 2 (two) times daily.  Dispense: 20 capsule; Refill: 0 - benzonatate (TESSALON) 100 MG capsule; Take 1 capsule (100 mg total) by mouth 3 (three) times daily.  Dispense: 20  capsule; Refill: 0  2. COPD, moderate ([facility]) - albuterol (PROVENTIL HFA;VENTOLIN HFA) 108 (90 Base) MCG/ACT inhaler; Inhale 2 puffs into the lungs every 6 (six) hours as needed for wheezing or shortness of breath.  Dispense: 1 Inhaler; Refill: 0   Meds ordered this encounter  Medications  . cefdinir (OMNICEF) 300 MG capsule    Sig: Take 1 capsule (300 mg total) by mouth 2 (two) times daily.    Dispense:  20 capsule    Refill:  0  . albuterol (PROVENTIL HFA;VENTOLIN HFA) 108 (90 Base) MCG/ACT inhaler    Sig: Inhale 2 puffs into the lungs every 6 (six) hours as needed for wheezing or shortness of breath.    Dispense:  1 Inhaler    Refill:  0  . benzonatate (TESSALON) 100 MG capsule    Sig: Take 1 capsule (100 mg total) by mouth 3 (three) times daily.    Dispense:  20 capsule    Refill:  0    Partially dictated using Editor, commissioning. Any errors are unintentional.  Halina Maidens, MD [facility]  06/07/2017

## 2017-06-22 ENCOUNTER — Other Ambulatory Visit: Payer: Self-pay | Admitting: Internal Medicine

## 2017-06-24 ENCOUNTER — Other Ambulatory Visit: Payer: Self-pay

## 2017-06-24 DIAGNOSIS — IMO0001 Reserved for inherently not codable concepts without codable children: Secondary | ICD-10-CM

## 2017-06-24 DIAGNOSIS — Z794 Long term (current) use of insulin: Principal | ICD-10-CM

## 2017-06-24 DIAGNOSIS — E1165 Type 2 diabetes mellitus with hyperglycemia: Principal | ICD-10-CM

## 2017-06-24 MED ORDER — INSULIN DETEMIR 100 UNIT/ML FLEXPEN: 40.0000 [IU] | PEN_INJECTOR | Freq: Two times a day (BID) | SUBCUTANEOUS | 11 refills | Status: DC

## 2017-06-28 ENCOUNTER — Ambulatory Visit (INDEPENDENT_AMBULATORY_CARE_PROVIDER_SITE_OTHER): Payer: Medicare Other | Admitting: Internal Medicine

## 2017-06-28 ENCOUNTER — Encounter: Payer: Self-pay | Admitting: Internal Medicine

## 2017-06-28 VITALS — BP 136/80 | HR 82 | Ht 62.0 in | Wt 258.0 lb

## 2017-06-28 DIAGNOSIS — L03116 Cellulitis of left lower limb: Secondary | ICD-10-CM | POA: Diagnosis not present

## 2017-06-28 MED ORDER — DOXYCYCLINE HYCLATE 100 MG PO TABS: 100.0000 mg | ORAL_TABLET | Freq: Two times a day (BID) | ORAL | 0 refills | Status: AC

## 2017-06-28 NOTE — Progress Notes (Signed)
Date:  06/28/2017   Name:  Molly Hoover   DOB:  [birth year]   MRN:  [id number]   Chief Complaint: Wound Check (Sore on left leg. Noticed it 2 weeks ago. No injury to it. Sore not going away. getting larger than what it was. )  The area started as a dime sized red flat spot.  Over the past few days it has been getting slowly bigger and the skin around is pinker. She has no pain, itching or drainage.  No known trauma or insect bite.  Review of Systems  Constitutional: Negative for chills, fatigue and unexpected weight change.  Respiratory: Negative for chest tightness and shortness of breath.   Cardiovascular: Negative for palpitations and leg swelling.  Skin: Positive for color change and rash.    Patient Active Problem List   Diagnosis Date Noted  . Mild non proliferative diabetic retinopathy ([place]) 01/01/2017  . Spinal stenosis of lumbar region 12/31/2016  . 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 Iliac artery aneurysm, bilateral ([place]) 12/15/2015  . Abdominal aortic aneurysm (AAA) without rupture ([place]) 12/11/2015  . Osteoarthritis of both knees 12/10/2015  . Uncontrolled type 2 diabetes mellitus without complication, with long-term current use of insulin ([place]) 04/03/2015  . COPD, moderate ([place]) 11/19/2014  . Edema extremities 11/19/2014  . H/O gastrointestinal disease 11/19/2014  . Hyperlipidemia associated with type 2 diabetes mellitus ([place]) 11/19/2014  . Muscle spasms of head and/or neck 11/19/2014  . Idiopathic osteoporosis 11/19/2014  . 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 Venous insufficiency of leg 11/19/2014  . Morbid obesity ([place]) 10/17/2013  . Essential hypertension 10/17/2013  . Chronic cough 10/17/2013    Prior to Admission medications   Medication Sig Start Date End Date Taking? Authorizing Provider  albuterol (PROVENTIL HFA;VENTOLIN HFA) 108 (90 Base) MCG/ACT inhaler Inhale 2 puffs into the lungs every 6 (six) hours as needed for wheezing or  shortness of breath. 06/07/17  Yes Glean Hess, MD  alendronate (FOSAMAX) 70 MG tablet TAKE 1 TABLET EVERY WEEK 06/22/17  Yes Glean Hess, MD  atorvastatin (LIPITOR) 20 MG tablet TAKE 1 TABLET AT BEDTIME 04/24/17  Yes Glean Hess, MD  B-D UF III MINI PEN NEEDLES 31G X 5 MM MISC USE 1 NEEDLE FOUR TIMES A DAY 02/10/17  Yes Glean Hess, MD  benzonatate (TESSALON) 100 MG capsule Take 1 capsule (100 mg total) by mouth 3 (three) times daily. 06/07/17  Yes Glean Hess, MD  Cranberry 500 MG CAPS Take 500 mg by mouth daily.    Yes [provider]  diclofenac (VOLTAREN) 75 MG EC tablet  11/03/15  Yes [provider]  DULERA 200-5 MCG/ACT AERO USE 1 INHALATION TWICE A DAY 08/13/16  Yes Glean Hess, MD  gemfibrozil (LOPID) 600 MG tablet TAKE 1 TABLET TWICE A DAY 01/10/17  Yes Glean Hess, MD  glucose blood (ONE TOUCH ULTRA TEST) test strip Use 4 times per day to test blood sugar. 01/21/16  Yes Glean Hess, MD  HUMALOG KWIKPEN 100 UNIT/ML KiwkPen INJECT 30 UNITS INTO THE SKIN 3 TIMES DAILY WITH MEALS 04/11/17  Yes Glean Hess, MD  Insulin Detemir (LEVEMIR FLEXTOUCH) 100 UNIT/ML Pen Inject 40 Units into the skin 2 (two) times daily. Patient is to inject 40 units in the morning, and 30 units in the evening. 06/24/17  Yes Glean Hess, MD  JANUVIA 100 MG tablet TAKE 1 TABLET DAILY 04/24/17  Yes Halina Maidens  H, MD  losartan (COZAAR) 25 MG tablet TAKE 1 TABLET DAILY 01/10/17  Yes Glean Hess, MD  metFORMIN (GLUCOPHAGE) 500 MG tablet TAKE 1 TABLET TWICE A DAY 05/11/17  Yes Glean Hess, MD  mometasone (ELOCON) 0.1 % cream Apply 1 application topically daily. 03/15/16  Yes Glean Hess, MD  omeprazole (PRILOSEC) 20 MG capsule TAKE 1 CAPSULE DAILY 10/26/16  Yes Glean Hess, MD  [facility] DELICA LANCETS 05L MISC 1 each by Does not apply route 2 (two) times daily. 05/02/17  Yes Glean Hess, MD  tiotropium (SPIRIVA HANDIHALER) 18  MCG inhalation capsule Place 1 capsule (18 mcg total) into inhaler and inhale daily. 05/10/17  Yes Glean Hess, MD  Vitamin D, Ergocalciferol, (DRISDOL) 50000 units CAPS capsule TAKE 1 CAPSULE TWICE WEEKLY 06/22/17  Yes Glean Hess, MD    Allergies  Allergen Reactions  . Codeine Hives and Itching  . Combivent [Ipratropium-Albuterol] Itching  . Ivp Dye [Iodinated Diagnostic Agents] Hives and Itching  . Meloxicam     Past Surgical History:  Procedure Laterality Date  . BREAST BIOPSY    . CARPAL TUNNEL RELEASE     right  . CERVICAL FUSION  2002  . CHOLECYSTECTOMY    . ESOPHAGOGASTRODUODENOSCOPY  03/2014   benign stricture dilated  . HERNIA REPAIR    . LUMBAR FUSION     L1-4   . 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 TOTAL ABDOMINAL HYSTERECTOMY      Social History   Tobacco Use  . Smoking status: Former Smoker    Years: 15.00    Types: Cigarettes  . Smokeless tobacco: Never Used  Substance Use Topics  . Alcohol use: Yes    Alcohol/week: 1.2 oz    Types: 2 Standard drinks or equivalent per week    Comment: occasional  . Drug use: No     Medication list has been reviewed and updated.  PHQ 2/9 Scores 01/03/2017 12/31/2016 12/10/2015 12/19/2014  PHQ - 2 Score 0 0 0 0    Physical Exam  Constitutional: She is oriented to person, place, and time. She appears well-developed. No distress.  HENT:  Head: Normocephalic and atraumatic.  Cardiovascular: Normal rate, regular rhythm and normal heart sounds.  Pulmonary/Chest: Effort normal and breath sounds normal. No respiratory distress. She has no wheezes. She has no rales.  Musculoskeletal: Normal range of motion.  Neurological: She is alert and oriented to person, place, and time.  Skin: Skin is warm and dry. No rash noted.     Psychiatric: She has a normal mood and affect. Her behavior is normal. Thought content normal.  Nursing note and vitals reviewed.   BP 136/80   Pulse 82   Ht 5\' 2"  (1.575 m)   Wt 258  lb (117 kg)   SpO2 98%   BMI 47.19 kg/m   Assessment and Plan: 1. Cellulitis of left lower extremity Monitor for worsening and return if needed - doxycycline (VIBRA-TABS) 100 MG tablet; Take 1 tablet (100 mg total) by mouth 2 (two) times daily for 10 days.  Dispense: 20 tablet; Refill: 0   Meds ordered this encounter  Medications  . doxycycline (VIBRA-TABS) 100 MG tablet    Sig: Take 1 tablet (100 mg total) by mouth 2 (two) times daily for 10 days.    Dispense:  20 tablet    Refill:  0    Partially dictated using Editor, commissioning. Any errors are unintentional.  Halina Maidens, MD [facility]  06/28/2017

## 2017-06-28 NOTE — Patient Instructions (Signed)
Keep area clean and dry.  If the area enlarges despite the antibiotics, call to be seen.

## 2017-06-29 IMAGING — US US RETROPERITONEAL COMPLETE
1 series · 13 of 25 positions shown · non-contrast
Comparison: None.

CLINICAL DATA: AAA screening.

EXAM:
ULTRASOUND RETROPERITONEAL COMPLETE
TECHNIQUE: Ultrasound examination of the abdominal aorta was performed to
evaluate for abdominal aortic aneurysm. The common iliac arteries,
IVC, and kidneys were also evaluated.

[Series 1: us retroperitoneal complete · 0.35mm/px · 13 of 72 slices shown]
[im 1/72]
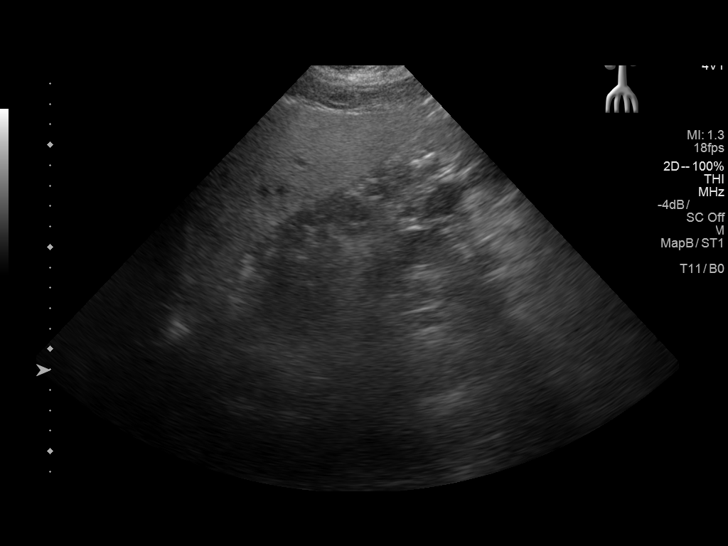
[im 6/72]
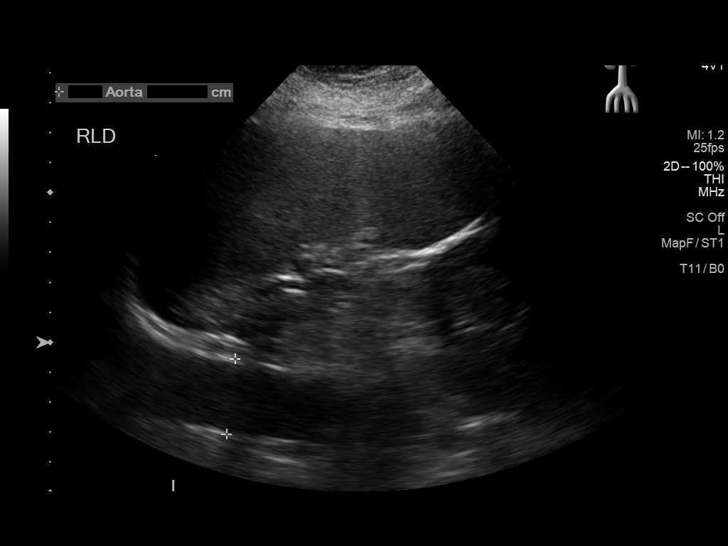
[im 12/72]
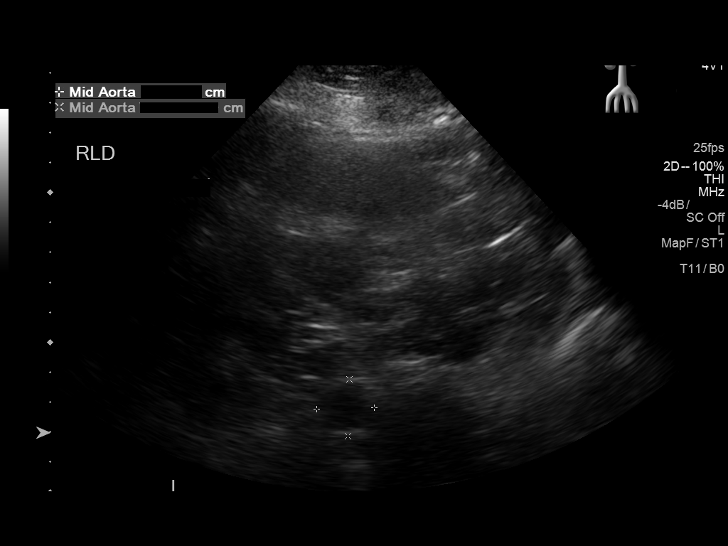
[im 18/72]
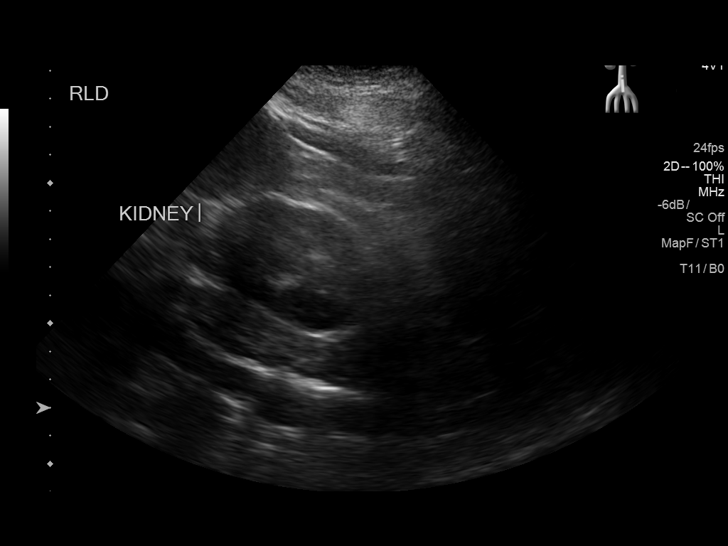
[im 24/72]
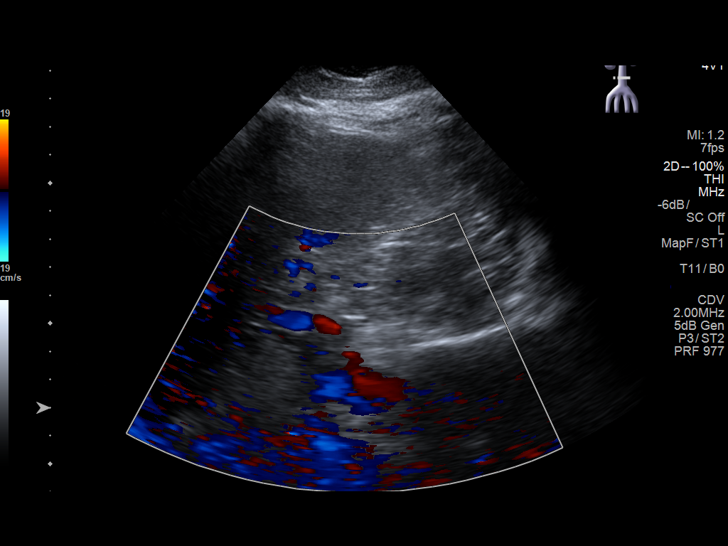
[im 30/72]
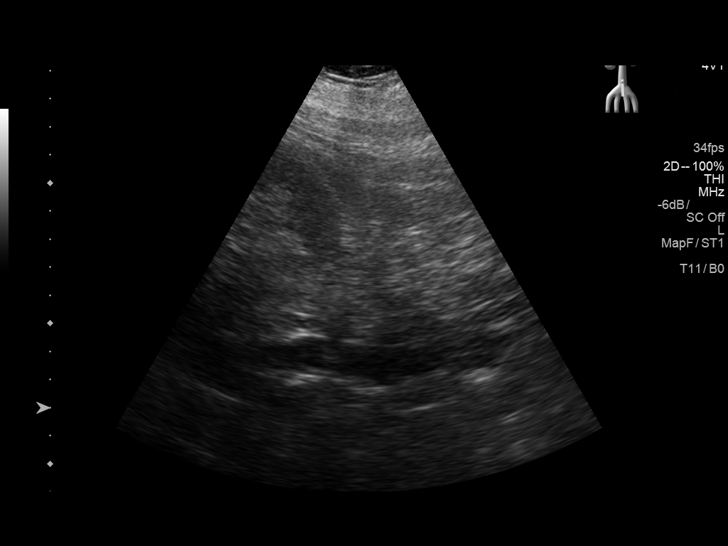
[im 36/72]
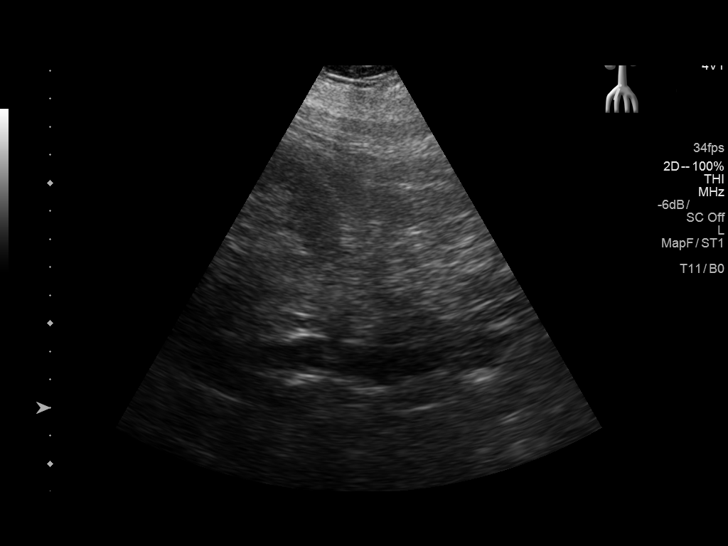
[im 42/72]
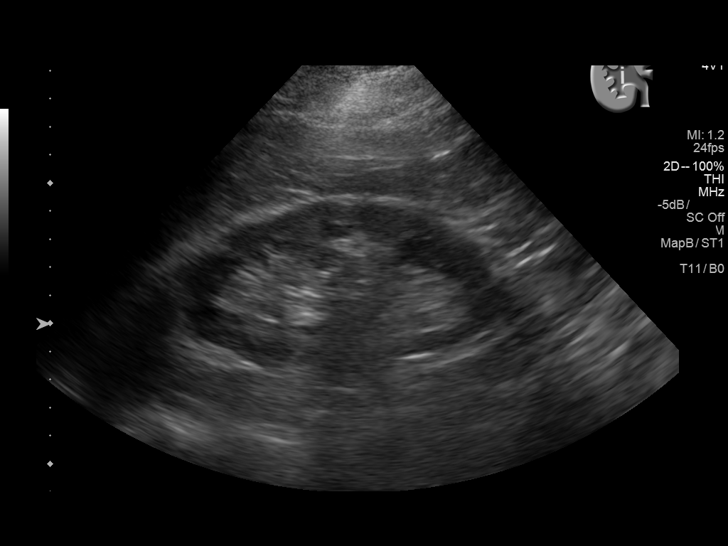
[im 48/72]
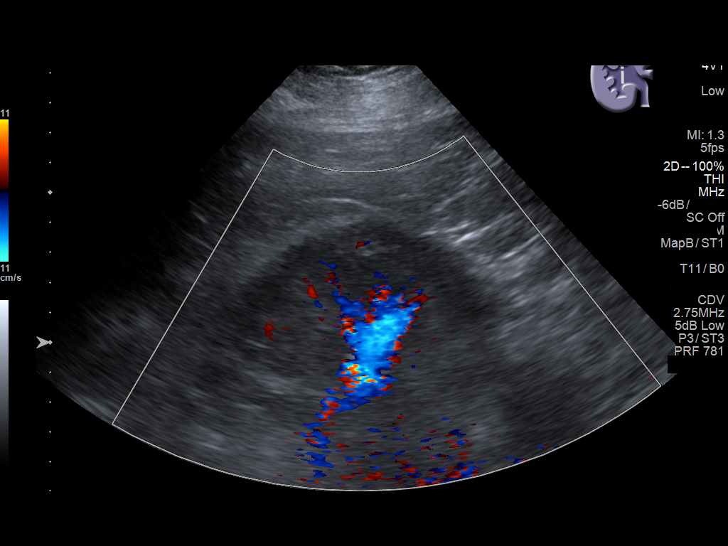
[im 54/72]
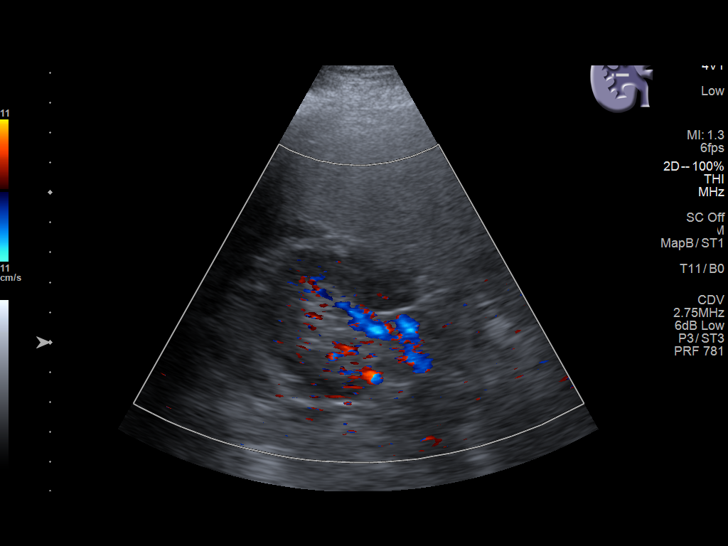
[im 60/72]
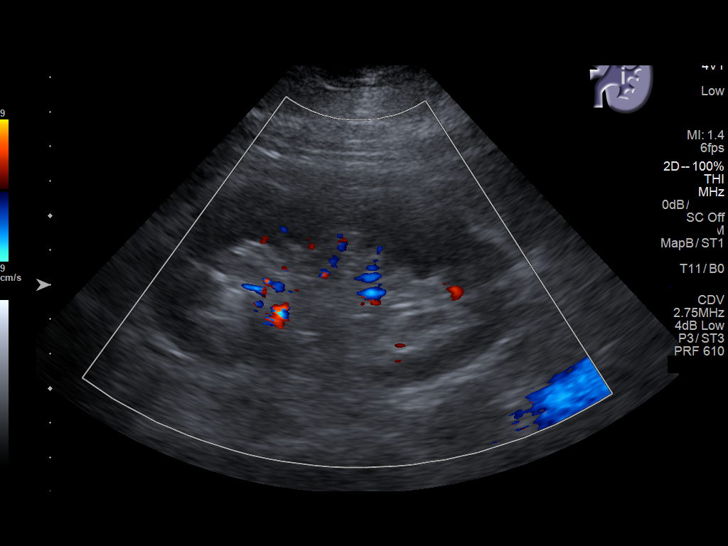
[im 66/72]
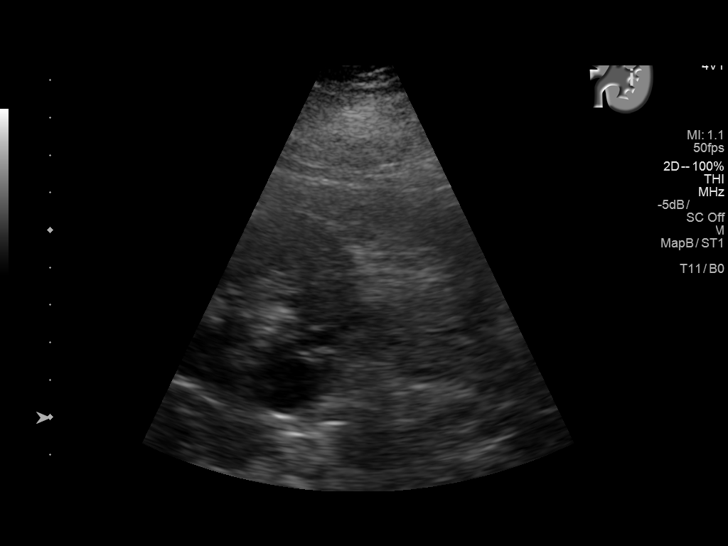
[im 72/72]
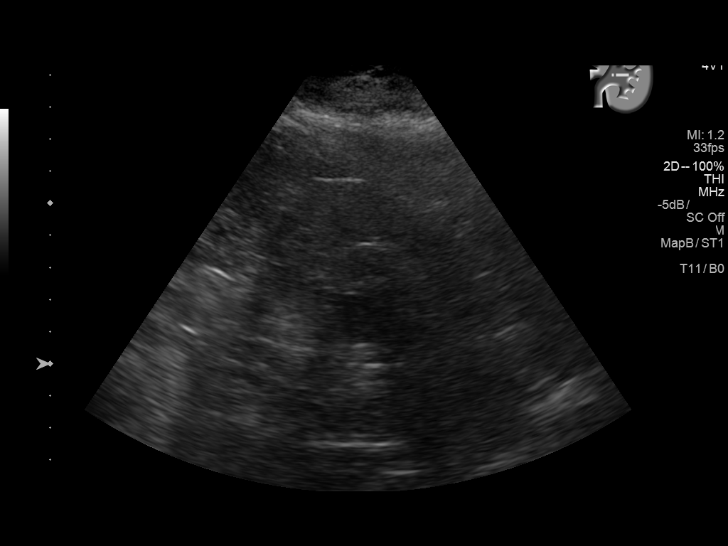

[13 of 25 positions shown; findings below may reference images not displayed]

FINDINGS: Abdominal Aorta

Mild ectasia 2.8 cm.

Maximum AP

Diameter:  2.8 cm

Maximum TRV

Diameter: 2.4 cm

Right Common Iliac Artery

Aneurysmal dilatation 1.8 cm

Left Common Iliac Artery

Aneurysmal dilatation 1.6 cm

IVC

No abnormality visualized.

Right Kidney

Length: 11.4 cm Echogenicity within normal limits. No mass or
hydronephrosis visualized.

Left Kidney

Length: 10.6 cm Echogenicity within normal limits. No hydronephrosis
visualized. A 2.01 1.9 cm simple cyst left kidney.
IMPRESSION: 1. Abdominal aortic ectasia 2.8 cm. Ectatic abdominal aorta at risk
for aneurysm development. Recommend followup by ultrasound in 5
years. This recommendation follows ACR consensus guidelines: White
Paper of the ACR Incidental Findings Committee II on Vascular
Findings. [HOSPITAL] 7938; [DATE].

2. Right common iliac artery aneurysm is 1.8 cm. Left common iliac
artery aneurysm at 1.6 cm. Vascular surgical consultation suggested
.

## 2017-07-09 ENCOUNTER — Other Ambulatory Visit: Payer: Self-pay | Admitting: Internal Medicine

## 2017-08-09 ENCOUNTER — Other Ambulatory Visit: Payer: Self-pay | Admitting: Internal Medicine

## 2017-08-10 ENCOUNTER — Encounter: Payer: Self-pay | Admitting: Internal Medicine

## 2017-08-10 ENCOUNTER — Ambulatory Visit (INDEPENDENT_AMBULATORY_CARE_PROVIDER_SITE_OTHER): Payer: Medicare Other | Admitting: Internal Medicine

## 2017-08-10 VITALS — BP 128/78 | HR 83 | Temp 98.0°F | Ht 62.0 in | Wt 251.0 lb

## 2017-08-10 DIAGNOSIS — J40 Bronchitis, not specified as acute or chronic: Secondary | ICD-10-CM | POA: Diagnosis not present

## 2017-08-10 DIAGNOSIS — J449 Chronic obstructive pulmonary disease, unspecified: Secondary | ICD-10-CM

## 2017-08-10 MED ORDER — ALBUTEROL SULFATE HFA 108 (90 BASE) MCG/ACT IN AERS: 2.0000 | INHALATION_SPRAY | Freq: Four times a day (QID) | RESPIRATORY_TRACT | 3 refills | Status: DC | PRN

## 2017-08-10 MED ORDER — PREDNISONE 10 MG PO TABS: ORAL_TABLET | ORAL | 0 refills | Status: DC

## 2017-08-10 MED ORDER — BENZONATATE 100 MG PO CAPS: 100.0000 mg | ORAL_CAPSULE | Freq: Three times a day (TID) | ORAL | 0 refills | Status: AC

## 2017-08-10 MED ORDER — LEVOFLOXACIN 500 MG PO TABS: 500.0000 mg | ORAL_TABLET | Freq: Every day | ORAL | 0 refills | Status: AC

## 2017-08-10 MED ORDER — ALBUTEROL SULFATE (2.5 MG/3ML) 0.083% IN NEBU: 2.5000 mg | INHALATION_SOLUTION | Freq: Once | RESPIRATORY_TRACT | Status: AC

## 2017-08-10 NOTE — Progress Notes (Signed)
Date:  08/10/2017   Name:  Molly Hoover   DOB:  March 22, 1946   MRN:  [id number]   Chief Complaint: Cough (Cough and Wheezing w/ green production. Seen UC 04/06 and was given Tessalon Perles, Zpack, and Prednisone (8 days). Still not better. )  Cough  This is a new problem. The current episode started 1 to 4 weeks ago. The problem has been unchanged. The problem occurs constantly. The cough is productive of sputum. Associated symptoms include shortness of breath and wheezing. Pertinent negatives include no chest pain, chills, fever or headaches.  Did not notice much benefit from zpak, prednisone taper.  She is using Dulera bid and also albuterol MDI.   Review of Systems  Constitutional: Positive for fatigue. Negative for chills and fever.  Respiratory: Positive for cough, chest tightness, shortness of breath and wheezing.   Cardiovascular: Negative for chest pain, palpitations and leg swelling.  Neurological: Negative for dizziness, syncope and headaches.  Psychiatric/Behavioral: Positive for sleep disturbance. Negative for dysphoric mood.    Patient Active Problem List   Diagnosis Date Noted  . Mild non proliferative diabetic retinopathy ([place]) 01/01/2017  . Spinal stenosis of lumbar region 12/31/2016  . 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 Iliac artery aneurysm, bilateral ([place]) 12/15/2015  . Abdominal aortic aneurysm (AAA) without rupture ([place]) 12/11/2015  . Osteoarthritis of both knees 12/10/2015  . Uncontrolled type 2 diabetes mellitus without complication, with long-term current use of insulin ([place]) 04/03/2015  . COPD, moderate ([place]) 11/19/2014  . Edema extremities 11/19/2014  . H/O gastrointestinal disease 11/19/2014  . Hyperlipidemia associated with type 2 diabetes mellitus ([place]) 11/19/2014  . Muscle spasms of head and/or neck 11/19/2014  . Idiopathic osteoporosis 11/19/2014  . 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 Venous insufficiency of leg  11/19/2014  . Morbid obesity ([place]) 10/17/2013  . Essential hypertension 10/17/2013  . Chronic cough 10/17/2013    Prior to Admission medications   Medication Sig Start Date End Date Taking? Authorizing Provider  albuterol (PROVENTIL HFA;VENTOLIN HFA) 108 (90 Base) MCG/ACT inhaler Inhale 2 puffs into the lungs every 6 (six) hours as needed for wheezing or shortness of breath. 06/07/17  Yes Glean Hess, MD  alendronate (FOSAMAX) 70 MG tablet TAKE 1 TABLET EVERY WEEK 06/22/17  Yes Glean Hess, MD  atorvastatin (LIPITOR) 20 MG tablet TAKE 1 TABLET AT BEDTIME 04/24/17  Yes Glean Hess, MD  B-D UF III MINI PEN NEEDLES 31G X 5 MM MISC USE 1 NEEDLE FOUR TIMES A DAY 02/10/17  Yes Glean Hess, MD  Cranberry 500 MG CAPS Take 500 mg by mouth daily.    Yes [provider]  diclofenac (VOLTAREN) 75 MG EC tablet  11/03/15  Yes [provider]  DULERA 200-5 MCG/ACT AERO USE 1 INHALATION TWICE A DAY 08/09/17  Yes Glean Hess, MD  gemfibrozil (LOPID) 600 MG tablet TAKE 1 TABLET TWICE A DAY 07/11/17  Yes Glean Hess, MD  glucose blood (ONE TOUCH ULTRA TEST) test strip Use 4 times per day to test blood sugar. 01/21/16  Yes Glean Hess, MD  HUMALOG KWIKPEN 100 UNIT/ML KiwkPen INJECT 30 UNITS INTO THE SKIN 3 TIMES DAILY WITH MEALS 04/11/17  Yes Glean Hess, MD  Insulin Detemir (LEVEMIR FLEXTOUCH) 100 UNIT/ML Pen Inject 40 Units into the skin 2 (two) times daily. Patient is to inject 40 units in the morning, and 30 units in the evening. 06/24/17  Yes Glean Hess,  MD  JANUVIA 100 MG tablet TAKE 1 TABLET DAILY 04/24/17  Yes Glean Hess, MD  losartan (COZAAR) 25 MG tablet TAKE 1 TABLET DAILY 07/11/17  Yes Glean Hess, MD  metFORMIN (GLUCOPHAGE) 500 MG tablet TAKE 1 TABLET TWICE A DAY 05/11/17  Yes Glean Hess, MD  mometasone (ELOCON) 0.1 % cream Apply 1 application topically daily. 03/15/16  Yes Glean Hess, MD  omeprazole (PRILOSEC)  20 MG capsule TAKE 1 CAPSULE DAILY 10/26/16  Yes Glean Hess, MD  [facility] DELICA LANCETS 58N MISC 1 each by Does not apply route 2 (two) times daily. 05/02/17  Yes Glean Hess, MD  tiotropium (SPIRIVA HANDIHALER) 18 MCG inhalation capsule Place 1 capsule (18 mcg total) into inhaler and inhale daily. 05/10/17  Yes Glean Hess, MD  Vitamin D, Ergocalciferol, (DRISDOL) 50000 units CAPS capsule TAKE 1 CAPSULE TWICE WEEKLY 06/22/17  Yes Glean Hess, MD    Allergies  Allergen Reactions  . Codeine Hives and Itching  . Combivent [Ipratropium-Albuterol] Itching  . Ivp Dye [Iodinated Diagnostic Agents] Hives and Itching  . Meloxicam     Past Surgical History:  Procedure Laterality Date  . BREAST BIOPSY    . CARPAL TUNNEL RELEASE     right  . CERVICAL FUSION  2002  . CHOLECYSTECTOMY    . ESOPHAGOGASTRODUODENOSCOPY  03/2014   benign stricture dilated  . HERNIA REPAIR    . LUMBAR FUSION     L1-4   . 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 TOTAL ABDOMINAL HYSTERECTOMY      Social History   Tobacco Use  . Smoking status: Former Smoker    Years: 15.00    Types: Cigarettes  . Smokeless tobacco: Never Used  Substance Use Topics  . Alcohol use: Yes    Alcohol/week: 1.2 oz    Types: 2 Standard drinks or equivalent per week    Comment: occasional  . Drug use: No     Medication list has been reviewed and updated.  PHQ 2/9 Scores 01/03/2017 12/31/2016 12/10/2015 12/19/2014  PHQ - 2 Score 0 0 0 0    Physical Exam  Constitutional: She is oriented to person, place, and time. She appears well-developed. No distress.  HENT:  Head: Normocephalic and atraumatic.  Cardiovascular: Normal rate, regular rhythm and normal heart sounds.  Pulmonary/Chest: Effort normal. No respiratory distress. She has wheezes in the right upper field, the right middle field, the right lower field, the left upper field, the left middle field and the left lower field. She has rhonchi in the right  upper field and the left upper field.  Improved air movement with decrease in wheezing after albuterol nebulizer treatment  Musculoskeletal: Normal range of motion.  Neurological: She is alert and oriented to person, place, and time.  Skin: Skin is warm and dry. No rash noted.  Psychiatric: She has a normal mood and affect. Her behavior is normal. Thought content normal.    BP 128/78   Pulse 83   Temp 98 F (36.7 C) (Oral)   Ht 5\' 2"  (1.575 m)   Wt 251 lb (113.9 kg)   SpO2 96%   BMI 45.91 kg/m   Assessment and Plan: 1. Bronchitis - albuterol (PROVENTIL) (2.5 MG/3ML) 0.083% nebulizer solution 2.5 mg - levofloxacin (LEVAQUIN) 500 MG tablet; Take 1 tablet (500 mg total) by mouth daily for 7 days.  Dispense: 7 tablet; Refill: 0 - predniSONE (DELTASONE) 10 MG tablet; Take 6 on day 1, 5  on day 2, 4 on day 3, 3 on day 4, 2 on day 5 and 1 on day 1 then stop.  Dispense: 21 tablet; Refill: 0 - benzonatate (TESSALON) 100 MG capsule; Take 1 capsule (100 mg total) by mouth 3 (three) times daily for 10 days.  Dispense: 30 capsule; Refill: 0  2. COPD, moderate (HCC) - albuterol (PROVENTIL HFA;VENTOLIN HFA) 108 (90 Base) MCG/ACT inhaler; Inhale 2 puffs into the lungs every 6 (six) hours as needed for wheezing or shortness of breath.  Dispense: 1 Inhaler; Refill: 3   Meds ordered this encounter  Medications  . albuterol (PROVENTIL) (2.5 MG/3ML) 0.083% nebulizer solution 2.5 mg  . levofloxacin (LEVAQUIN) 500 MG tablet    Sig: Take 1 tablet (500 mg total) by mouth daily for 7 days.    Dispense:  7 tablet    Refill:  0  . predniSONE (DELTASONE) 10 MG tablet    Sig: Take 6 on day 1, 5 on day 2, 4 on day 3, 3 on day 4, 2 on day 5 and 1 on day 1 then stop.    Dispense:  21 tablet    Refill:  0  . benzonatate (TESSALON) 100 MG capsule    Sig: Take 1 capsule (100 mg total) by mouth 3 (three) times daily for 10 days.    Dispense:  30 capsule    Refill:  0  . albuterol (PROVENTIL HFA;VENTOLIN HFA)  108 (90 Base) MCG/ACT inhaler    Sig: Inhale 2 puffs into the lungs every 6 (six) hours as needed for wheezing or shortness of breath.    Dispense:  1 Inhaler    Refill:  3    Partially dictated using Editor, commissioning. Any errors are unintentional.  Halina Maidens, MD [facility]  08/10/2017

## 2017-08-16 ENCOUNTER — Ambulatory Visit (INDEPENDENT_AMBULATORY_CARE_PROVIDER_SITE_OTHER): Payer: Medicare Other | Admitting: Vascular Surgery

## 2017-08-16 ENCOUNTER — Encounter (INDEPENDENT_AMBULATORY_CARE_PROVIDER_SITE_OTHER): Payer: Medicare Other

## 2017-08-18 NOTE — Progress Notes (Signed)
[facility] Consultation      Assessment and Plan:  COPD.  -COPD, currently on Symbicort and Spiriva, will continue.  Acute bronchitis with cough.  -Cough of about 4 months duration, uncertain etiology, began after an episode of COPD exacerbation/acute bronchitis.  Patient is already on adequate medications for her COPD, she is taking a PPI. - We will check a chest x-ray, sputum culture.  I have asked her to take Tessalon 200 mg 3 times daily, and Robitussin-DM 3 times daily. -Follow-up in 1 month's time at that time can consider further empiric therapy versus bronchoscopy.  Obesity.  -Can contribute to dyspnea, rate loss is recommended.  Orders Placed This Encounter  Procedures  . DG Chest 2 View    Standing Status:   Future    Standing Expiration Date:   10/20/2018    Order Specific Question:   Reason for Exam (SYMPTOM  OR DIAGNOSIS REQUIRED)    Answer:   dyspnea    Order Specific Question:   Preferred imaging location?    Answer:   [facility]    Order Specific Question:   Radiology Contrast Protocol - do NOT remove file path    Answer:   [email or web address]   Meds ordered this encounter  Medications  . benzonatate (TESSALON PERLES) 100 MG capsule    Sig: Take 2 capsules (200 mg total) by mouth 3 (three) times daily.    Dispense:  90 capsule    Refill:  2   Return in about 1 month (around 09/16/2017).    Date: 08/19/2017  MRN# [id number] Molly Hoover May 22, 1945    Molly Hoover is a 72 y.o. old female seen in consultation for chief complaint of:    Chief Complaint  Patient presents with  . Consult    Referred  by Dr. Aida Puffer for evaluation of cough/wheezing. Seen in urgent care on 4/6 given Tessalon Perles, Zpac and Prednisone 8 day taper.  . Cough    pt saw pcp on 08/10/17 and was given Levaquin, Prednisone and refilled  Tessalon. Pt still cough dark yellow mucus.  . Shortness  of Breath    with and without exertion    HPI:   The patient is a 72 year old female, she has seen her PCP recently, she was noted to have a cough which was productive.  She also noted shortness of breath and wheezing.  She was seen by her PCP, did not seem to have much improvement with Z-Pak, prednisone, Dulera, albuterol MDI.  The cough came on as an episode of bronchitis, the cough persisted since January, she received 2 rounds prednisone, levaquin, zpack, perles, ventolin, spiriva and and dulera.  The cough is slightly less than previous but persistent, she occasionally "coughs my head off". The cough wakes her from sleep. Cough is productive of yellow sputum.  Never had a cough this long before.  Robitussin helps a bit.   She has no pets. Denies reflux, has occasional sinus drainage. He last smoked in 1990.    CBC 05/02/2017; previous medical count 100.   PMHX:   Past Medical History:  Diagnosis Date  . Allergic rhinitis   . Anxiety   . Asthma   . Bilateral breast cysts   . COPD (chronic obstructive pulmonary disease) ([place])   . Degenerative disc disease   . Depression   . Diabetes mellitus without complication ([place])   . Diabetic peripheral neuropathy ([place])   . Diverticulosis   . Enlarged heart   .  GERD (gastroesophageal reflux disease)   . Hiatal hernia   . Hyperlipidemia   . Hypertension   . Hypothyroidism   . PVD (peripheral vascular disease) ([place])   . Syncope and collapse   . Vitamin D deficiency    Surgical Hx:  Past Surgical History:  Procedure Laterality Date  . BREAST BIOPSY    . CARPAL TUNNEL RELEASE     right  . CERVICAL FUSION  2002  . CHOLECYSTECTOMY    . ESOPHAGOGASTRODUODENOSCOPY  03/2014   benign stricture dilated  . HERNIA REPAIR    . LUMBAR FUSION     L1-4   . 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 TOTAL ABDOMINAL HYSTERECTOMY     Family Hx:  Family History  Problem Relation Age of Onset  . Heart attack Mother 58  . Hypertension  Mother   . Heart attack Father    Social Hx:   Social History   Tobacco Use  . Smoking status: Former Smoker    Years: 15.00    Types: Cigarettes  . Smokeless tobacco: Never Used  Substance Use Topics  . Alcohol use: Yes    Alcohol/week: 1.2 oz    Types: 2 Standard drinks or equivalent per week    Comment: occasional  . Drug use: No   Medication:    Current Outpatient Medications:  .  albuterol (PROVENTIL HFA;VENTOLIN HFA) 108 (90 Base) MCG/ACT inhaler, Inhale 2 puffs into the lungs every 6 (six) hours as needed for wheezing or shortness of breath., Disp: 1 Inhaler, Rfl: 3 .  alendronate (FOSAMAX) 70 MG tablet, TAKE 1 TABLET EVERY WEEK, Disp: 12 tablet, Rfl: 3 .  atorvastatin (LIPITOR) 20 MG tablet, TAKE 1 TABLET AT BEDTIME, Disp: 90 tablet, Rfl: 3 .  B-D UF III MINI PEN NEEDLES 31G X 5 MM MISC, USE 1 NEEDLE FOUR TIMES A DAY, Disp: 360 each, Rfl: 3 .  benzonatate (TESSALON) 100 MG capsule, Take 1 capsule (100 mg total) by mouth 3 (three) times daily for 10 days., Disp: 30 capsule, Rfl: 0 .  Cranberry 500 MG CAPS, Take 500 mg by mouth daily. , Disp: , Rfl:  .  diclofenac (VOLTAREN) 75 MG EC tablet, , Disp: , Rfl:  .  DULERA 200-5 MCG/ACT AERO, USE 1 INHALATION TWICE A DAY, Disp: 39 g, Rfl: 3 .  gemfibrozil (LOPID) 600 MG tablet, TAKE 1 TABLET TWICE A DAY, Disp: 180 tablet, Rfl: 1 .  glucose blood (ONE TOUCH ULTRA TEST) test strip, Use 4 times per day to test blood sugar., Disp: 150 each, Rfl: 12 .  HUMALOG KWIKPEN 100 UNIT/ML KiwkPen, INJECT 30 UNITS INTO THE SKIN 3 TIMES DAILY WITH MEALS, Disp: 30 mL, Rfl: 11 .  Insulin Detemir (LEVEMIR FLEXTOUCH) 100 UNIT/ML Pen, Inject 40 Units into the skin 2 (two) times daily. Patient is to inject 40 units in the morning, and 30 units in the evening., Disp: 30 mL, Rfl: 11 .  JANUVIA 100 MG tablet, TAKE 1 TABLET DAILY, Disp: 90 tablet, Rfl: 3 .  losartan (COZAAR) 25 MG tablet, TAKE 1 TABLET DAILY, Disp: 90 tablet, Rfl: 1 .  metFORMIN  (GLUCOPHAGE) 500 MG tablet, TAKE 1 TABLET TWICE A DAY, Disp: 180 tablet, Rfl: 1 .  mometasone (ELOCON) 0.1 % cream, Apply 1 application topically daily. (Patient taking differently: Apply 1 application topically daily as needed. ), Disp: 15 g, Rfl: 0 .  omeprazole (PRILOSEC) 20 MG capsule, TAKE 1 CAPSULE DAILY, Disp: 90 capsule, Rfl: 3 .  ONETOUCH  DELICA LANCETS 32T MISC, 1 each by Does not apply route 2 (two) times daily., Disp: 100 each, Rfl: 5 .  tiotropium (SPIRIVA HANDIHALER) 18 MCG inhalation capsule, Place 1 capsule (18 mcg total) into inhaler and inhale daily., Disp: 90 capsule, Rfl: 1 .  Vitamin D, Ergocalciferol, (DRISDOL) 50000 units CAPS capsule, TAKE 1 CAPSULE TWICE WEEKLY, Disp: 24 capsule, Rfl: 3  Current Facility-Administered Medications:  .  albuterol (PROVENTIL) (2.5 MG/3ML) 0.083% nebulizer solution 2.5 mg, 2.5 mg, Nebulization, Once, [employer] Melia Jesse Sans, MD   Allergies:  Codeine; Combivent [ipratropium-albuterol]; Ivp dye [iodinated diagnostic agents]; and Meloxicam  Review of Systems: Gen:  Denies  fever, sweats, chills HEENT: Denies blurred vision, double vision. bleeds, sore throat Cvc:  No dizziness, chest pain. Resp:   Denies cough or sputum production, shortness of breath Gi: Denies swallowing difficulty, stomach pain. Gu:  Denies bladder incontinence, burning urine Ext:   No Joint pain, stiffness. Skin: No skin rash,  hives  Endoc:  No polyuria, polydipsia. Psych: No depression, insomnia. Other:  All other systems were reviewed with the patient and were negative other that what is mentioned in the HPI.   Physical Examination:   VS: BP 132/72 (BP Location: Left Arm, Cuff Size: Large)   Pulse 94   Resp 16   Ht 5\' 2"  (1.575 m)   Wt 251 lb (113.9 kg)   SpO2 95%   BMI 45.91 kg/m   General Appearance: No distress obese. Neuro:without focal findings,  speech normal,  HEENT: PERRLA, EOM intact.   Pulmonary: normal breath sounds, No wheezing.    CardiovascularNormal S1,S2.  No m/r/g.   Abdomen: Benign, Soft, non-tender. Renal:  No costovertebral tenderness  GU:  No performed at this time. Endoc: No evident thyromegaly, no signs of acromegaly. Skin:   warm, no rashes, no ecchymosis  Extremities: normal, no cyanosis, clubbing.  Other findings:    LABORATORY PANEL:   CBC No results for input(s): WBC, HGB, HCT, PLT in the last 168 hours. ------------------------------------------------------------------------------------------------------------------  Chemistries  No results for input(s): NA, K, CL, CO2, GLUCOSE, BUN, CREATININE, CALCIUM, MG, AST, ALT, ALKPHOS, BILITOT in the last 168 hours.  Invalid input(s): GFRCGP ------------------------------------------------------------------------------------------------------------------  Cardiac Enzymes No results for input(s): TROPONINI in the last 168 hours. ------------------------------------------------------------  RADIOLOGY:  No results found.     Thank  you for the consultation and for allowing [facility] to assist in the care of your patient. Our recommendations are noted above.  Please contact us if we can be of further service.   Marda Stalker, MD.  Board Certified in Internal Medicine, Pulmonary Medicine, [place], and Sleep Medicine.  [facility] Office Number: [phone]  Patricia Pesa, M.D.  Merton Border, M.D  08/19/2017

## 2017-08-19 ENCOUNTER — Ambulatory Visit
Admission: RE | Admit: 2017-08-19 | Discharge: 2017-08-19 | Disposition: A | Discharge status: Home / Self Care | Payer: Medicare Other | Source: Ambulatory Visit | Attending: Internal Medicine | Admitting: Internal Medicine

## 2017-08-19 ENCOUNTER — Telehealth: Payer: Self-pay | Admitting: Internal Medicine

## 2017-08-19 ENCOUNTER — Ambulatory Visit (INDEPENDENT_AMBULATORY_CARE_PROVIDER_SITE_OTHER): Payer: Medicare Other | Admitting: Internal Medicine

## 2017-08-19 ENCOUNTER — Encounter: Payer: Self-pay | Admitting: Internal Medicine

## 2017-08-19 VITALS — BP 132/72 | HR 94 | Resp 16 | Ht 62.0 in | Wt 251.0 lb

## 2017-08-19 DIAGNOSIS — R0609 Other forms of dyspnea: Secondary | ICD-10-CM | POA: Diagnosis present

## 2017-08-19 DIAGNOSIS — R918 Other nonspecific abnormal finding of lung field: Secondary | ICD-10-CM | POA: Diagnosis not present

## 2017-08-19 MED ORDER — BENZONATATE 100 MG PO CAPS: 200.0000 mg | ORAL_CAPSULE | Freq: Three times a day (TID) | ORAL | 2 refills | Status: DC

## 2017-08-19 NOTE — Telephone Encounter (Signed)
Patient calling and would like to know if results have come in from chest xray  Please call to discuss

## 2017-08-19 NOTE — Telephone Encounter (Signed)
Called patient with results. Nothing further needed. 

## 2017-08-19 NOTE — Telephone Encounter (Signed)
Showed some mild scarring, will see how she is doing when she comes back.

## 2017-08-19 NOTE — Patient Instructions (Signed)
Start taking tessalon perles 2 tablets three times per day.  Take robitussin DM (or mucinex DM) three times daily.   Will check a sputum and chest xray.

## 2017-08-31 ENCOUNTER — Ambulatory Visit (INDEPENDENT_AMBULATORY_CARE_PROVIDER_SITE_OTHER): Payer: Medicare Other | Admitting: Internal Medicine

## 2017-08-31 ENCOUNTER — Encounter: Payer: Self-pay | Admitting: Internal Medicine

## 2017-08-31 VITALS — BP 134/83 | HR 83 | Resp 16 | Ht 62.0 in | Wt 245.0 lb

## 2017-08-31 DIAGNOSIS — J449 Chronic obstructive pulmonary disease, unspecified: Secondary | ICD-10-CM | POA: Diagnosis not present

## 2017-08-31 DIAGNOSIS — E785 Hyperlipidemia, unspecified: Secondary | ICD-10-CM

## 2017-08-31 DIAGNOSIS — M62830 Muscle spasm of back: Secondary | ICD-10-CM

## 2017-08-31 DIAGNOSIS — I10 Essential (primary) hypertension: Secondary | ICD-10-CM | POA: Diagnosis not present

## 2017-08-31 DIAGNOSIS — I77811 Abdominal aortic ectasia: Secondary | ICD-10-CM

## 2017-08-31 DIAGNOSIS — IMO0002 Reserved for concepts with insufficient information to code with codable children: Secondary | ICD-10-CM

## 2017-08-31 DIAGNOSIS — E1165 Type 2 diabetes mellitus with hyperglycemia: Secondary | ICD-10-CM | POA: Diagnosis not present

## 2017-08-31 DIAGNOSIS — E1139 Type 2 diabetes mellitus with other diabetic ophthalmic complication: Secondary | ICD-10-CM | POA: Diagnosis not present

## 2017-08-31 DIAGNOSIS — E1169 Type 2 diabetes mellitus with other specified complication: Secondary | ICD-10-CM | POA: Diagnosis not present

## 2017-08-31 MED ORDER — CYCLOBENZAPRINE HCL 10 MG PO TABS: 10.0000 mg | ORAL_TABLET | Freq: Three times a day (TID) | ORAL | 0 refills | Status: DC | PRN

## 2017-08-31 MED ORDER — GLUCOSE BLOOD VI STRP: ORAL_STRIP | 3 refills | Status: DC

## 2017-08-31 NOTE — Progress Notes (Signed)
Date:  08/31/2017   Name:  Molly Hoover   DOB:  1945/08/05   MRN:  [id number]   Chief Complaint: Diabetes (234 BS today after 300s in past week but just finished prednisone Thursday from pneumonia and bronchitis. ) and Back Pain (No injury-spinal stenosis )  Diabetes  She presents for her follow-up diabetic visit. She has type 2 diabetes mellitus. Her disease course has been fluctuating. Pertinent negatives for hypoglycemia include no headaches or tremors. Pertinent negatives for diabetes include no chest pain, no fatigue, no polydipsia and no polyuria. Diabetic complications include retinopathy. Current diabetic treatment includes oral agent (dual therapy) and insulin injections (metformin and [place]). She is compliant with treatment most of the time. Her weight is stable. Her home blood glucose trend is fluctuating dramatically (since being on prednisone).  Back Pain  This is a recurrent problem. The problem occurs daily. The pain is present in the lumbar spine. The quality of the pain is described as aching. The pain does not radiate. The pain is mild. The pain is worse during the day. The symptoms are aggravated by bending and twisting. Pertinent negatives include no abdominal pain, chest pain, dysuria, fever, headaches or numbness. She has tried muscle relaxant for the symptoms. The treatment provided moderate (would like a new Rx) relief.  Hypertension  This is a chronic problem. The problem is controlled. Pertinent negatives include no chest pain, headaches, neck pain, palpitations or shortness of breath. Hypertensive end-organ damage includes retinopathy.  Levemir 40 units and 34 units humlog with each meal.  Lab Results  Component Value Date   HGBA1C 10.6 (H) 05/02/2017   COPD - recently had several rounds of prednisone and antibiotics without much improvement.  Seen by Pulmonary about 2 weeks ago.  Plans for sputum culture and possible bronchoscopy. She has improved with  tesslon perles 6 per day and robitussin plus another round of prednisone. She has follow up next month.  Review of Systems  Constitutional: Negative for appetite change, fatigue, fever and unexpected weight change.  HENT: Negative for tinnitus and trouble swallowing.   Eyes: Negative for visual disturbance.  Respiratory: Negative for cough, chest tightness and shortness of breath.   Cardiovascular: Negative for chest pain, palpitations and leg swelling.  Gastrointestinal: Negative for abdominal pain.  Endocrine: Negative for polydipsia and polyuria.  Genitourinary: Negative for dysuria and hematuria.  Musculoskeletal: Positive for back pain and gait problem. Negative for arthralgias and neck pain.  Allergic/Immunologic: Negative for environmental allergies.  Neurological: Negative for tremors, numbness and headaches.  Psychiatric/Behavioral: Negative for dysphoric mood.    Patient Active Problem List   Diagnosis Date Noted  . Spinal stenosis of lumbar region 12/31/2016  . Hand pain, right 08/12/2016  . Presence of left artificial knee joint 06/10/2016  . 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 Iliac artery aneurysm, bilateral ([place]) 12/15/2015  . Abdominal aortic ectasia ([place]) 12/11/2015  . Osteoarthritis of both knees 12/10/2015  . Arthritis of hand 10/06/2015  . Contusion of left knee 10/06/2015  . Type II diabetes mellitus with ophthalmic manifestations, uncontrolled ([place]) 04/03/2015  . COPD, moderate ([place]) 11/19/2014  . Edema extremities 11/19/2014  . H/O gastrointestinal disease 11/19/2014  . Hyperlipidemia associated with type 2 diabetes mellitus ([place]) 11/19/2014  . Muscle spasms of head and/or neck 11/19/2014  . Idiopathic osteoporosis 11/19/2014  . 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 Venous insufficiency of leg 11/19/2014  . Morbid obesity ([place]) 10/17/2013  . Essential hypertension 10/17/2013  .  Chronic cough 10/17/2013    Prior to Admission medications    Medication Sig Start Date End Date Taking? Authorizing Provider  albuterol (PROVENTIL HFA;VENTOLIN HFA) 108 (90 Base) MCG/ACT inhaler Inhale 2 puffs into the lungs every 6 (six) hours as needed for wheezing or shortness of breath. 08/10/17   Glean Hess, MD  alendronate (FOSAMAX) 70 MG tablet TAKE 1 TABLET EVERY WEEK 06/22/17   Glean Hess, MD  atorvastatin (LIPITOR) 20 MG tablet TAKE 1 TABLET AT BEDTIME 04/24/17   Glean Hess, MD  B-D UF III MINI PEN NEEDLES 31G X 5 MM MISC USE 1 NEEDLE FOUR TIMES A DAY 02/10/17   Glean Hess, MD  benzonatate (TESSALON PERLES) 100 MG capsule Take 2 capsules (200 mg total) by mouth 3 (three) times daily. 08/19/17 08/19/18  Laverle Hobby, MD  Cranberry 500 MG CAPS Take 500 mg by mouth daily.     [provider]  diclofenac (VOLTAREN) 75 MG EC tablet  11/03/15   [provider]  DULERA 200-5 MCG/ACT AERO USE 1 INHALATION TWICE A DAY 08/09/17   Glean Hess, MD  gemfibrozil (LOPID) 600 MG tablet TAKE 1 TABLET TWICE A DAY 07/11/17   Glean Hess, MD  glucose blood (ONE TOUCH ULTRA TEST) test strip Use 4 times per day to test blood sugar. 01/21/16   Glean Hess, MD  HUMALOG KWIKPEN 100 UNIT/ML KiwkPen INJECT 30 UNITS INTO THE SKIN 3 TIMES DAILY WITH MEALS 04/11/17   Glean Hess, MD  Insulin Detemir (LEVEMIR FLEXTOUCH) 100 UNIT/ML Pen Inject 40 Units into the skin 2 (two) times daily. Patient is to inject 40 units in the morning, and 30 units in the evening. 06/24/17   Glean Hess, MD  JANUVIA 100 MG tablet TAKE 1 TABLET DAILY 04/24/17   Glean Hess, MD  losartan (COZAAR) 25 MG tablet TAKE 1 TABLET DAILY 07/11/17   Glean Hess, MD  metFORMIN (GLUCOPHAGE) 500 MG tablet TAKE 1 TABLET TWICE A DAY 05/11/17   Glean Hess, MD  mometasone (ELOCON) 0.1 % cream Apply 1 application topically daily. Patient taking differently: Apply 1 application topically daily as needed.  03/15/16   Glean Hess, MD  omeprazole (PRILOSEC) 20 MG capsule TAKE 1 CAPSULE DAILY 10/26/16   Glean Hess, MD  [facility] DELICA LANCETS 09X MISC 1 each by Does not apply route 2 (two) times daily. 05/02/17   Glean Hess, MD  tiotropium (SPIRIVA HANDIHALER) 18 MCG inhalation capsule Place 1 capsule (18 mcg total) into inhaler and inhale daily. 05/10/17   Glean Hess, MD  Vitamin D, Ergocalciferol, (DRISDOL) 50000 units CAPS capsule TAKE 1 CAPSULE TWICE WEEKLY 06/22/17   Glean Hess, MD    Allergies  Allergen Reactions  . Codeine Hives and Itching  . Combivent [Ipratropium-Albuterol] Itching  . Ivp Dye [Iodinated Diagnostic Agents] Hives and Itching  . Meloxicam     Past Surgical History:  Procedure Laterality Date  . BREAST BIOPSY    . CARPAL TUNNEL RELEASE     right  . CERVICAL FUSION  2002  . CHOLECYSTECTOMY    . ESOPHAGOGASTRODUODENOSCOPY  03/2014   benign stricture dilated  . HERNIA REPAIR    . LUMBAR FUSION     L1-4   . 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 TOTAL ABDOMINAL HYSTERECTOMY      Social History   Tobacco Use  . Smoking status: Former Smoker    Years:  15.00    Types: Cigarettes  . Smokeless tobacco: Never Used  Substance Use Topics  . Alcohol use: Yes    Alcohol/week: 1.2 oz    Types: 2 Standard drinks or equivalent per week    Comment: occasional  . Drug use: No     Medication list has been reviewed and updated.  PHQ 2/9 Scores 01/03/2017 12/31/2016 12/10/2015 12/19/2014  PHQ - 2 Score 0 0 0 0    Physical Exam Wt Readings from Last 3 Encounters:  08/31/17 245 lb (111.1 kg)  08/19/17 251 lb (113.9 kg)  08/10/17 251 lb (113.9 kg)    BP 134/83   Pulse 83   Resp 16   Ht 5\' 2"  (1.575 m)   Wt 245 lb (111.1 kg)   SpO2 97%   BMI 44.81 kg/m   Assessment and Plan: 1. Type II diabetes mellitus with ophthalmic manifestations, uncontrolled ([place]) Continue current medications - may need to increase Levemir Continue efforts at weight loss -  Hemoglobin A1c - glucose blood (ONE TOUCH ULTRA TEST) test strip; Use 4 times per day to test blood sugar.  Dispense: 400 each; Refill: 3  2. Essential hypertension controlled  3. Hyperlipidemia associated with type 2 diabetes mellitus (HCC) Check labs - Lipid panel  4. Abdominal aortic ectasia ([place]) Repeat US in 2022  5. Muscle spasm of back - cyclobenzaprine (FLEXERIL) 10 MG tablet; Take 1 tablet (10 mg total) by mouth 3 (three) times daily as needed for muscle spasms.  Dispense: 40 tablet; Refill: 0  6. COPD, moderate ([place]) Continue meds, follow up with Pulmonary  Meds ordered this encounter  Medications  . glucose blood (ONE TOUCH ULTRA TEST) test strip    Sig: Use 4 times per day to test blood sugar.    Dispense:  400 each    Refill:  3    Dx E11.39  . cyclobenzaprine (FLEXERIL) 10 MG tablet    Sig: Take 1 tablet (10 mg total) by mouth 3 (three) times daily as needed for muscle spasms.    Dispense:  40 tablet    Refill:  0    Partially dictated using Editor, commissioning. Any errors are unintentional.  Halina Maidens, MD [facility]  08/31/2017

## 2017-09-01 LAB — LIPID PANEL
CHOLESTEROL TOTAL: 155 mg/dL (ref 100–199)
Chol/HDL Ratio: 3.4 ratio (ref 0.0–4.4)
HDL: 46 mg/dL (ref >39)
LDL Calculated: 90 mg/dL (ref 0–99)
TRIGLYCERIDES: 96 mg/dL (ref 0–149)
VLDL Cholesterol Cal: 19 mg/dL (ref 5–40)

## 2017-09-01 LAB — HEMOGLOBIN A1C
ESTIMATED AVERAGE GLUCOSE: 249 mg/dL
HEMOGLOBIN A1C: 10.3 % — AB (ref 4.8–5.6)

## 2017-09-16 NOTE — Progress Notes (Signed)
[facility] Consultation      Assessment and Plan:  COPD, possible ACOS.  - currently on Symbicort and Spiriva, will continue.  Acute bronchitis with acute exacerbation of COPD - We will give course of prednisone taper and antibiotic. - Patient is at high risk for recurrent exacerbation, further decline, hospitalization.  Cough. - We will renew Tessalon.  Obesity.  -Can contribute to dyspnea, rate loss is recommended.  No orders of the defined types were placed in this encounter.  Meds ordered this encounter  Medications  . predniSONE (STERAPRED UNI-PAK 21 TAB) 10 MG (21) TBPK tablet    Sig: Take as directed.    Dispense:  21 tablet    Refill:  0  . azithromycin (ZITHROMAX) 250 MG tablet    Sig: Take 1 tablet (250 mg total) by mouth daily. Take 2 tabs at once on the first day, then once daily.    Dispense:  6 tablet    Refill:  0  . benzonatate (TESSALON PERLES) 100 MG capsule    Sig: Take 2 capsules (200 mg total) by mouth 3 (three) times daily.    Dispense:  90 capsule    Refill:  2   Return in about 3 months (around 12/21/2017).    Date: 09/16/2017  MRN# [id number] Armilda Vanderlinden Lormand 21-May-1945    Shawntina Diffee Zarcone is a 72 y.o. old female seen in consultation for chief complaint of:    Chief Complaint  Patient presents with  . Shortness of Breath    HPI:   The patient is a 72 year old female, last seen with a persistent cough and COPD.  At last visit she was sent for a chest x-ray, sputum culture.  She was asked to take Tessalon and Robitussin-DM. She could not get the sputum culture done.  The cough went away for about 2 weeks. Then it came back with wheezing and dyspnea.  She has been taking dulera 2 puffs twice daily and spiriva once daily, she uses ventolin tid and it is helping.   **Imaging personally reviewed, chest x-ray 08/19/2017; mild hyperinflation, changes of chronic bronchitis. **CBC 05/02/2017; previous medical count  100.  Medication:    Current Outpatient Medications:  .  albuterol (PROVENTIL HFA;VENTOLIN HFA) 108 (90 Base) MCG/ACT inhaler, Inhale 2 puffs into the lungs every 6 (six) hours as needed for wheezing or shortness of breath., Disp: 1 Inhaler, Rfl: 3 .  alendronate (FOSAMAX) 70 MG tablet, TAKE 1 TABLET EVERY WEEK, Disp: 12 tablet, Rfl: 3 .  atorvastatin (LIPITOR) 20 MG tablet, TAKE 1 TABLET AT BEDTIME, Disp: 90 tablet, Rfl: 3 .  B-D UF III MINI PEN NEEDLES 31G X 5 MM MISC, USE 1 NEEDLE FOUR TIMES A DAY, Disp: 360 each, Rfl: 3 .  benzonatate (TESSALON) 100 MG capsule, Take 100 mg by mouth 3 (three) times daily as needed for cough., Disp: , Rfl:  .  Cranberry 500 MG CAPS, Take 500 mg by mouth daily. , Disp: , Rfl:  .  cyclobenzaprine (FLEXERIL) 10 MG tablet, Take 1 tablet (10 mg total) by mouth 3 (three) times daily as needed for muscle spasms., Disp: 40 tablet, Rfl: 0 .  diclofenac (VOLTAREN) 75 MG EC tablet, , Disp: , Rfl:  .  DULERA 200-5 MCG/ACT AERO, USE 1 INHALATION TWICE A DAY, Disp: 39 g, Rfl: 3 .  gemfibrozil (LOPID) 600 MG tablet, TAKE 1 TABLET TWICE A DAY, Disp: 180 tablet, Rfl: 1 .  glucose blood (ONE TOUCH ULTRA TEST) test  strip, Use 4 times per day to test blood sugar., Disp: 400 each, Rfl: 3 .  HUMALOG KWIKPEN 100 UNIT/ML KiwkPen, INJECT 30 UNITS INTO THE SKIN 3 TIMES DAILY WITH MEALS, Disp: 30 mL, Rfl: 11 .  Insulin Detemir (LEVEMIR FLEXTOUCH) 100 UNIT/ML Pen, Inject 40 Units into the skin 2 (two) times daily. Patient is to inject 40 units in the morning, and 30 units in the evening., Disp: 30 mL, Rfl: 11 .  JANUVIA 100 MG tablet, TAKE 1 TABLET DAILY, Disp: 90 tablet, Rfl: 3 .  losartan (COZAAR) 25 MG tablet, TAKE 1 TABLET DAILY, Disp: 90 tablet, Rfl: 1 .  metFORMIN (GLUCOPHAGE) 500 MG tablet, TAKE 1 TABLET TWICE A DAY, Disp: 180 tablet, Rfl: 1 .  mometasone (ELOCON) 0.1 % cream, Apply 1 application topically daily. (Patient taking differently: Apply 1 application topically daily as  needed. ), Disp: 15 g, Rfl: 0 .  omeprazole (PRILOSEC) 20 MG capsule, TAKE 1 CAPSULE DAILY, Disp: 90 capsule, Rfl: 3 .  ONETOUCH DELICA LANCETS 22W MISC, 1 each by Does not apply route 2 (two) times daily., Disp: 100 each, Rfl: 5 .  tiotropium (SPIRIVA HANDIHALER) 18 MCG inhalation capsule, Place 1 capsule (18 mcg total) into inhaler and inhale daily., Disp: 90 capsule, Rfl: 1 .  Vitamin D, Ergocalciferol, (DRISDOL) 50000 units CAPS capsule, TAKE 1 CAPSULE TWICE WEEKLY, Disp: 24 capsule, Rfl: 3  Current Facility-Administered Medications:  .  albuterol (PROVENTIL) (2.5 MG/3ML) 0.083% nebulizer solution 2.5 mg, 2.5 mg, Nebulization, Once, [place] Melia Jesse Sans, MD   Allergies:  Codeine; Combivent [ipratropium-albuterol]; Ivp dye [iodinated diagnostic agents]; and Meloxicam  Review of Systems: Gen:  Denies  fever, sweats, chills HEENT: Denies blurred vision, double vision. bleeds, sore throat Cvc:  No dizziness, chest pain. Resp:   Denies cough or sputum production, shortness of breath Gi: Denies swallowing difficulty, stomach pain. Gu:  Denies bladder incontinence, burning urine Ext:   No Joint pain, stiffness. Skin: No skin rash,  hives  Endoc:  No polyuria, polydipsia. Psych: No depression, insomnia. Other:  All other systems were reviewed with the patient and were negative other that what is mentioned in the HPI.   Physical Examination:   VS: BP (!) 148/96 (BP Location: Left Arm, Cuff Size: Large)   Pulse 95   Resp 16   Ht 5\' 2"  (1.575 m)   Wt 254 lb (115.2 kg)   SpO2 95%   BMI 46.46 kg/m   General Appearance: No distress obese. Neuro:without focal findings,  speech normal,  HEENT: PERRLA, EOM intact.   Pulmonary: normal breath sounds, No wheezing.  CardiovascularNormal S1,S2.  No m/r/g.   Abdomen: Benign, Soft, non-tender. Renal:  No costovertebral tenderness  GU:  No performed at this time. Endoc: No evident thyromegaly, no signs of acromegaly. Skin:   warm, no rashes, no  ecchymosis  Extremities: normal, no cyanosis, clubbing.  Other findings:    LABORATORY PANEL:   CBC No results for input(s): WBC, HGB, HCT, PLT in the last 168 hours. ------------------------------------------------------------------------------------------------------------------  Chemistries  No results for input(s): NA, K, CL, CO2, GLUCOSE, BUN, CREATININE, CALCIUM, MG, AST, ALT, ALKPHOS, BILITOT in the last 168 hours.  Invalid input(s): GFRCGP ------------------------------------------------------------------------------------------------------------------  Cardiac Enzymes No results for input(s): TROPONINI in the last 168 hours. ------------------------------------------------------------  RADIOLOGY:  No results found.     Thank  you for the consultation and for allowing [facility] to assist in the care of your patient. Our recommendations are noted above.  Please contact us if we can be of further service.   Marda Stalker, MD.  Board Certified in Internal Medicine, Pulmonary Medicine, [place], and Sleep Medicine.  [facility] Office Number: [phone]  Patricia Pesa, M.D.  Merton Border, M.D  09/16/2017

## 2017-09-20 ENCOUNTER — Encounter: Payer: Self-pay | Admitting: Internal Medicine

## 2017-09-20 ENCOUNTER — Ambulatory Visit (INDEPENDENT_AMBULATORY_CARE_PROVIDER_SITE_OTHER): Payer: Medicare Other | Admitting: Internal Medicine

## 2017-09-20 VITALS — BP 148/96 | HR 95 | Resp 16 | Ht 62.0 in | Wt 254.0 lb

## 2017-09-20 DIAGNOSIS — R0609 Other forms of dyspnea: Secondary | ICD-10-CM

## 2017-09-20 DIAGNOSIS — J441 Chronic obstructive pulmonary disease with (acute) exacerbation: Secondary | ICD-10-CM

## 2017-09-20 MED ORDER — PREDNISONE 10 MG (21) PO TBPK: ORAL_TABLET | ORAL | 0 refills | Status: DC

## 2017-09-20 MED ORDER — AZITHROMYCIN 250 MG PO TABS: 250.0000 mg | ORAL_TABLET | Freq: Every day | ORAL | 0 refills | Status: DC

## 2017-09-20 MED ORDER — PREDNISONE 10 MG (21) PO TBPK
ORAL_TABLET | ORAL | 0 refills | Status: DC
Start: 2017-09-20 — End: 2017-12-21

## 2017-09-20 MED ORDER — BENZONATATE 100 MG PO CAPS: 200.0000 mg | ORAL_CAPSULE | Freq: Three times a day (TID) | ORAL | 2 refills | Status: DC

## 2017-09-20 NOTE — Addendum Note (Signed)
Addended by: Stephanie Coup on: 09/20/2017 09:48 AM   Modules accepted: Orders

## 2017-09-20 NOTE — Patient Instructions (Signed)
Have sent in prescription for prednisone taper, antibiotic, Tessalon Perls. We will see her back in 3 months, call us sooner if symptoms persist or worsen.

## 2017-10-21 ENCOUNTER — Other Ambulatory Visit: Payer: Self-pay | Admitting: Internal Medicine

## 2017-11-07 ENCOUNTER — Other Ambulatory Visit: Payer: Self-pay | Admitting: Internal Medicine

## 2017-12-21 ENCOUNTER — Encounter: Payer: Self-pay | Admitting: Internal Medicine

## 2017-12-21 ENCOUNTER — Ambulatory Visit (INDEPENDENT_AMBULATORY_CARE_PROVIDER_SITE_OTHER): Payer: Medicare Other | Admitting: Internal Medicine

## 2017-12-21 VITALS — BP 128/78 | HR 77 | Ht 62.0 in | Wt 247.0 lb

## 2017-12-21 DIAGNOSIS — J449 Chronic obstructive pulmonary disease, unspecified: Secondary | ICD-10-CM | POA: Diagnosis not present

## 2017-12-21 DIAGNOSIS — R0609 Other forms of dyspnea: Secondary | ICD-10-CM | POA: Diagnosis not present

## 2017-12-21 MED ORDER — ALBUTEROL SULFATE 108 (90 BASE) MCG/ACT IN AEPB: 1.0000 | INHALATION_SPRAY | RESPIRATORY_TRACT | 2 refills | Status: DC | PRN

## 2017-12-21 NOTE — Progress Notes (Signed)
[facility] Consultation      Assessment and Plan:  COPD - currently on dulera and Spiriva, will continue. -COPD appears under control at this time, patient continued to have mild exertional dyspnea. - Recommend flu shot this season. -We will prescribe rescue inhaler to be used as needed.  Obesity.  -Can contribute to dyspnea, rate loss is recommended.  Meds ordered this encounter  Medications  . Albuterol Sulfate (PROAIR RESPICLICK) 893 (90 Base) MCG/ACT AEPB    Sig: Inhale 1-2 puffs into the lungs every 4 (four) hours as needed.    Dispense:  1 each    Refill:  2     Return in about 6 months (around 06/23/2018).    Date: 12/21/2017  MRN# [id number] Molly Hoover 21-Aug-1945    Molly Hoover is a 72 y.o. old female seen in consultation for chief complaint of:    Chief Complaint  Patient presents with  . Follow-up    dry cough    HPI:   The patient is a 73 year old female, last seen with a persistent cough and COPD.  At last visit she was sent for a chest x-ray, sputum culture.   At last visit she was having an exacerbation, she is now doing better. She is taking spiriva on the am, dulera bid.  Her cough has mostly resolved and does not feel bothersome currently.   **chest x-ray 08/19/2017>> mild hyperinflation, changes of chronic bronchitis. **CBC 05/02/2017>>previous medical count 100.  Medication:    Current Outpatient Medications:  .  albuterol (PROVENTIL HFA;VENTOLIN HFA) 108 (90 Base) MCG/ACT inhaler, Inhale 2 puffs into the lungs every 6 (six) hours as needed for wheezing or shortness of breath., Disp: 1 Inhaler, Rfl: 3 .  alendronate (FOSAMAX) 70 MG tablet, TAKE 1 TABLET EVERY WEEK, Disp: 12 tablet, Rfl: 3 .  atorvastatin (LIPITOR) 20 MG tablet, TAKE 1 TABLET AT BEDTIME, Disp: 90 tablet, Rfl: 3 .  azithromycin (ZITHROMAX) 250 MG tablet, Take 1 tablet (250 mg total) by mouth daily. Take 2 tabs at once on the first day, then  once daily., Disp: 6 tablet, Rfl: 0 .  B-D UF III MINI PEN NEEDLES 31G X 5 MM MISC, USE 1 NEEDLE FOUR TIMES A DAY, Disp: 360 each, Rfl: 3 .  benzonatate (TESSALON PERLES) 100 MG capsule, Take 2 capsules (200 mg total) by mouth 3 (three) times daily., Disp: 90 capsule, Rfl: 2 .  benzonatate (TESSALON) 100 MG capsule, Take 100 mg by mouth 3 (three) times daily as needed for cough., Disp: , Rfl:  .  Cranberry 500 MG CAPS, Take 500 mg by mouth daily. , Disp: , Rfl:  .  cyclobenzaprine (FLEXERIL) 10 MG tablet, Take 1 tablet (10 mg total) by mouth 3 (three) times daily as needed for muscle spasms., Disp: 40 tablet, Rfl: 0 .  diclofenac (VOLTAREN) 75 MG EC tablet, , Disp: , Rfl:  .  DULERA 200-5 MCG/ACT AERO, USE 1 INHALATION TWICE A DAY, Disp: 39 g, Rfl: 3 .  gemfibrozil (LOPID) 600 MG tablet, TAKE 1 TABLET TWICE A DAY, Disp: 180 tablet, Rfl: 1 .  glucose blood (ONE TOUCH ULTRA TEST) test strip, Use 4 times per day to test blood sugar., Disp: 400 each, Rfl: 3 .  HUMALOG KWIKPEN 100 UNIT/ML KiwkPen, INJECT 30 UNITS INTO THE SKIN 3 TIMES DAILY WITH MEALS, Disp: 30 mL, Rfl: 11 .  Insulin Detemir (LEVEMIR FLEXTOUCH) 100 UNIT/ML Pen, Inject 40 Units into the skin 2 (two) times daily.  Patient is to inject 40 units in the morning, and 30 units in the evening., Disp: 30 mL, Rfl: 11 .  JANUVIA 100 MG tablet, TAKE 1 TABLET DAILY, Disp: 90 tablet, Rfl: 3 .  losartan (COZAAR) 25 MG tablet, TAKE 1 TABLET DAILY, Disp: 90 tablet, Rfl: 1 .  metFORMIN (GLUCOPHAGE) 500 MG tablet, TAKE 1 TABLET TWICE A DAY, Disp: 180 tablet, Rfl: 1 .  mometasone (ELOCON) 0.1 % cream, Apply 1 application topically daily. (Patient taking differently: Apply 1 application topically daily as needed. ), Disp: 15 g, Rfl: 0 .  omeprazole (PRILOSEC) 20 MG capsule, TAKE 1 CAPSULE DAILY, Disp: 90 capsule, Rfl: 3 .  ONETOUCH DELICA LANCETS 64B MISC, 1 each by Does not apply route 2 (two) times daily., Disp: 100 each, Rfl: 5 .  predniSONE (STERAPRED  UNI-PAK 21 TAB) 10 MG (21) TBPK tablet, Take as directed., Disp: 21 tablet, Rfl: 0 .  tiotropium (SPIRIVA HANDIHALER) 18 MCG inhalation capsule, Place 1 capsule (18 mcg total) into inhaler and inhale daily., Disp: 90 capsule, Rfl: 1 .  Vitamin D, Ergocalciferol, (DRISDOL) 50000 units CAPS capsule, TAKE 1 CAPSULE TWICE WEEKLY, Disp: 24 capsule, Rfl: 3  Current Facility-Administered Medications:  .  albuterol (PROVENTIL) (2.5 MG/3ML) 0.083% nebulizer solution 2.5 mg, 2.5 mg, Nebulization, Once, [place] Melia Jesse Sans, MD   Allergies:  Codeine; Combivent [ipratropium-albuterol]; Ivp dye [iodinated diagnostic agents]; and Meloxicam    LABORATORY PANEL:   CBC No results for input(s): WBC, HGB, HCT, PLT in the last 168 hours. ------------------------------------------------------------------------------------------------------------------  Chemistries  No results for input(s): NA, K, CL, CO2, GLUCOSE, BUN, CREATININE, CALCIUM, MG, AST, ALT, ALKPHOS, BILITOT in the last 168 hours.  Invalid input(s): GFRCGP ------------------------------------------------------------------------------------------------------------------  Cardiac Enzymes No results for input(s): TROPONINI in the last 168 hours. ------------------------------------------------------------  RADIOLOGY:  No results found.     Thank  you for the consultation and for allowing [facility] to assist in the care of your patient. Our recommendations are noted above.  Please contact us if we can be of further service.   Marda Stalker, M.D., F.C.C.P.  Board Certified in Internal Medicine, Pulmonary Medicine, [place], and Sleep Medicine.  [facility] Office Number: [phone]   12/21/2017

## 2017-12-21 NOTE — Patient Instructions (Signed)
Continue your current inhaled medications.  Remember to get your flu shot this year.

## 2017-12-28 ENCOUNTER — Ambulatory Visit (INDEPENDENT_AMBULATORY_CARE_PROVIDER_SITE_OTHER): Payer: Medicare Other | Admitting: Internal Medicine

## 2017-12-28 ENCOUNTER — Encounter: Payer: Self-pay | Admitting: Internal Medicine

## 2017-12-28 VITALS — BP 142/80 | HR 80 | Ht 62.0 in | Wt 248.0 lb

## 2017-12-28 DIAGNOSIS — E1169 Type 2 diabetes mellitus with other specified complication: Secondary | ICD-10-CM

## 2017-12-28 DIAGNOSIS — M818 Other osteoporosis without current pathological fracture: Secondary | ICD-10-CM

## 2017-12-28 DIAGNOSIS — Z23 Encounter for immunization: Secondary | ICD-10-CM

## 2017-12-28 DIAGNOSIS — E1139 Type 2 diabetes mellitus with other diabetic ophthalmic complication: Secondary | ICD-10-CM

## 2017-12-28 DIAGNOSIS — I10 Essential (primary) hypertension: Secondary | ICD-10-CM

## 2017-12-28 DIAGNOSIS — E785 Hyperlipidemia, unspecified: Secondary | ICD-10-CM

## 2017-12-28 DIAGNOSIS — E1165 Type 2 diabetes mellitus with hyperglycemia: Secondary | ICD-10-CM

## 2017-12-28 DIAGNOSIS — Z0000 Encounter for general adult medical examination without abnormal findings: Secondary | ICD-10-CM

## 2017-12-28 DIAGNOSIS — J449 Chronic obstructive pulmonary disease, unspecified: Secondary | ICD-10-CM

## 2017-12-28 DIAGNOSIS — F17201 Nicotine dependence, unspecified, in remission: Secondary | ICD-10-CM

## 2017-12-28 DIAGNOSIS — I77811 Abdominal aortic ectasia: Secondary | ICD-10-CM

## 2017-12-28 DIAGNOSIS — IMO0002 Reserved for concepts with insufficient information to code with codable children: Secondary | ICD-10-CM

## 2017-12-28 DIAGNOSIS — Z1231 Encounter for screening mammogram for malignant neoplasm of breast: Secondary | ICD-10-CM

## 2017-12-28 DIAGNOSIS — I723 Aneurysm of iliac artery: Secondary | ICD-10-CM

## 2017-12-28 LAB — POCT URINALYSIS DIPSTICK
GLUCOSE UA: POSITIVE — AB
Ketones, UA: NEGATIVE
Nitrite, UA: NEGATIVE
Protein, UA: NEGATIVE
RBC UA: NEGATIVE
SPEC GRAV UA: 1.02 (ref 1.010–1.025)
Urobilinogen, UA: 0.2 E.U./dL
pH, UA: 6.5 (ref 5.0–8.0)

## 2017-12-28 MED ORDER — ZOSTER VAC RECOMB ADJUVANTED 50 MCG/0.5ML IM SUSR: 0.5000 mL | Freq: Once | INTRAMUSCULAR | 1 refills | Status: AC

## 2017-12-28 NOTE — Progress Notes (Signed)
Date:  12/28/2017   Name:  Molly Hoover   DOB:  [birth year]   MRN:  [id number]   Chief Complaint: Annual Exam (Breast Exam. Still needs to void at end of visit. ) Molly Hoover is a 72 y.o. female who presents today for her Complete Annual Exam. She feels well. She reports exercising walking very little. She reports she is sleeping well. She denies breast problems.  She is due for mammogram.  Hypertension  This is a chronic problem. The problem is controlled. Pertinent negatives include no chest pain, headaches, palpitations or shortness of breath. Past treatments include angiotensin blockers. The current treatment provides significant improvement.  Diabetes  She presents for her follow-up diabetic visit. She has type 2 diabetes mellitus. Her disease course has been fluctuating. Pertinent negatives for hypoglycemia include no dizziness, headaches, nervousness/anxiousness or tremors. Pertinent negatives for diabetes include no chest pain, no fatigue, no polydipsia and no polyuria. Symptoms are improving. Current diabetic treatment includes insulin injections and oral agent (dual therapy). An ACE inhibitor/angiotensin II receptor blocker is being taken. Eye exam is not current.  Hyperlipidemia  This is a chronic problem. The problem is controlled. Pertinent negatives include no chest pain or shortness of breath. Current antihyperlipidemic treatment includes statins and bile acid squestrants (should probably stop Lopid). The current treatment provides significant improvement of lipids.  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  Due for one year follow up this fall.  OP - on weekly Fosamax without side effects.  She is not interested in having another DEXA. COPD - stable, followed by Pulmonary.  No recent infections, cough, sputum, or fever.  Review of Systems  Constitutional: Negative for chills, fatigue and fever.  HENT: Negative for  congestion, hearing loss, tinnitus, trouble swallowing and voice change.   Eyes: Negative for visual disturbance.  Respiratory: Negative for cough, chest tightness, shortness of breath and wheezing.   Cardiovascular: Negative for chest pain, palpitations and leg swelling.  Gastrointestinal: Negative for abdominal pain, constipation, diarrhea and vomiting.  Endocrine: Negative for polydipsia and polyuria.  Genitourinary: Negative for dysuria, frequency, genital sores, vaginal bleeding and vaginal discharge.  Musculoskeletal: Positive for arthralgias and gait problem. Negative for joint swelling.  Skin: Positive for rash. Negative for color change.  Neurological: Negative for dizziness, tremors, light-headedness and headaches.  Hematological: Negative for adenopathy. Does not bruise/bleed easily.  Psychiatric/Behavioral: Negative for dysphoric mood and sleep disturbance. The patient is not nervous/anxious.     Patient Active Problem List   Diagnosis Date Noted  . Spinal stenosis of lumbar region 12/31/2016  . Hand pain, right 08/12/2016  . Presence of left artificial knee joint 06/10/2016  . 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 Iliac artery aneurysm, bilateral ([place]) 12/15/2015  . 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 Osteoarthritis of both knees 12/10/2015  . Arthritis of hand 10/06/2015  . Contusion of left knee 10/06/2015  . Type II diabetes mellitus with ophthalmic manifestations, uncontrolled ([place]) 04/03/2015  . COPD, moderate ([place]) 11/19/2014  . Edema extremities 11/19/2014  . H/O gastrointestinal disease 11/19/2014  . Hyperlipidemia associated with type 2 diabetes mellitus ([place]) 11/19/2014  . Muscle spasms of head and/or neck 11/19/2014  . Idiopathic osteoporosis 11/19/2014  . 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 Venous insufficiency of leg 11/19/2014  . Morbid obesity ([place]) 10/17/2013  . Essential hypertension 10/17/2013  . Chronic cough 10/17/2013     Allergies  Allergen Reactions  .  Codeine Hives and Itching  . Combivent [Ipratropium-Albuterol] Itching  . Ivp Dye [Iodinated Diagnostic Agents] Hives and Itching  . Meloxicam     Past Surgical History:  Procedure Laterality Date  . BREAST BIOPSY    . CARPAL TUNNEL RELEASE     right  . CERVICAL FUSION  2002  . CHOLECYSTECTOMY    . ESOPHAGOGASTRODUODENOSCOPY  03/2014   benign stricture dilated  . HERNIA REPAIR    . LUMBAR FUSION     L1-4   . 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 TOTAL ABDOMINAL HYSTERECTOMY      Social History   Tobacco Use  . Smoking status: Former Smoker    Years: 15.00    Types: Cigarettes  . Smokeless tobacco: Never Used  Substance Use Topics  . Alcohol use: Yes    Alcohol/week: 2.0 standard drinks    Types: 2 Standard drinks or equivalent per week    Comment: occasional  . Drug use: No     Medication list has been reviewed and updated.  Current Meds  Medication Sig  . albuterol (PROVENTIL HFA;VENTOLIN HFA) 108 (90 Base) MCG/ACT inhaler Inhale 2 puffs into the lungs every 6 (six) hours as needed for wheezing or shortness of breath.  . Albuterol Sulfate (PROAIR RESPICLICK) 161 (90 Base) MCG/ACT AEPB Inhale 1-2 puffs into the lungs every 4 (four) hours as needed.  Marland Kitchen alendronate (FOSAMAX) 70 MG tablet TAKE 1 TABLET EVERY WEEK  . atorvastatin (LIPITOR) 20 MG tablet TAKE 1 TABLET AT BEDTIME  . B-D UF III MINI PEN NEEDLES 31G X 5 MM MISC USE 1 NEEDLE FOUR TIMES A DAY  . Cranberry 500 MG CAPS Take 500 mg by mouth daily.   . cyclobenzaprine (FLEXERIL) 10 MG tablet Take 1 tablet (10 mg total) by mouth 3 (three) times daily as needed for muscle spasms.  . diclofenac (VOLTAREN) 75 MG EC tablet   . DULERA 200-5 MCG/ACT AERO USE 1 INHALATION TWICE A DAY  . gemfibrozil (LOPID) 600 MG tablet TAKE 1 TABLET TWICE A DAY  . glucose blood (ONE TOUCH ULTRA TEST) test strip Use 4 times per day to test blood sugar.  Marland Kitchen HUMALOG KWIKPEN 100 UNIT/ML KiwkPen  INJECT 30 UNITS INTO THE SKIN 3 TIMES DAILY WITH MEALS  . Insulin Detemir (LEVEMIR FLEXTOUCH) 100 UNIT/ML Pen Inject 40 Units into the skin 2 (two) times daily. Patient is to inject 40 units in the morning, and 30 units in the evening.  Marland Kitchen JANUVIA 100 MG tablet TAKE 1 TABLET DAILY  . losartan (COZAAR) 25 MG tablet TAKE 1 TABLET DAILY  . metFORMIN (GLUCOPHAGE) 500 MG tablet TAKE 1 TABLET TWICE A DAY  . omeprazole (PRILOSEC) 20 MG capsule TAKE 1 CAPSULE DAILY  . ONETOUCH DELICA LANCETS 09U MISC 1 each by Does not apply route 2 (two) times daily.  Marland Kitchen tiotropium (SPIRIVA HANDIHALER) 18 MCG inhalation capsule Place 1 capsule (18 mcg total) into inhaler and inhale daily.  . Vitamin D, Ergocalciferol, (DRISDOL) 50000 units CAPS capsule TAKE 1 CAPSULE TWICE WEEKLY   Current Facility-Administered Medications for the 12/28/17 encounter (Office Visit) with Glean Hess, MD  Medication  . albuterol (PROVENTIL) (2.5 MG/3ML) 0.083% nebulizer solution 2.5 mg    PHQ 2/9 Scores 12/28/2017 01/03/2017 12/31/2016 12/10/2015  PHQ - 2 Score 0 0 0 0    Physical Exam  Constitutional: She is oriented to person, place, and time. She appears well-developed and well-nourished. No distress.  HENT:  Head: Normocephalic and atraumatic.  Right Ear: Tympanic membrane and ear canal normal.  Left Ear: Tympanic membrane and ear canal normal.  Nose: Right sinus exhibits no maxillary sinus tenderness. Left sinus exhibits no maxillary sinus tenderness.  Mouth/Throat: Uvula is midline and oropharynx is clear and moist.  Eyes: Conjunctivae and EOM are normal. Right eye exhibits no discharge. Left eye exhibits no discharge. No scleral icterus.  Neck: Normal range of motion. Carotid bruit is not present. No erythema present. No thyromegaly present.  Cardiovascular: Normal rate, regular rhythm, normal heart sounds and normal pulses.  Pulmonary/Chest: Effort normal. No respiratory distress. She has no wheezes. Right breast exhibits no  mass, no nipple discharge, no skin change and no tenderness. Left breast exhibits no mass, no nipple discharge, no skin change and no tenderness.  Abdominal: Soft. Bowel sounds are normal. There is no hepatosplenomegaly. There is no tenderness. There is no CVA tenderness.  Musculoskeletal: Normal range of motion.  Lymphadenopathy:    She has no cervical adenopathy.    She has no axillary adenopathy.  Neurological: She is alert and oriented to person, place, and time. She has normal reflexes. No cranial nerve deficit or sensory deficit.  Skin: Skin is warm, dry and intact. No rash noted.  Peeling red rash on both feet  Psychiatric: She has a normal mood and affect. Her speech is normal and behavior is normal. Thought content normal.  Nursing note and vitals reviewed.  Wt Readings from Last 3 Encounters:  12/28/17 248 lb (112.5 kg)  12/21/17 247 lb (112 kg)  09/20/17 254 lb (115.2 kg)    BP (!) 142/80 (BP Location: Right Arm, Patient Position: Sitting, Cuff Size: Normal)   Pulse 80   Ht 5\' 2"  (1.575 m)   Wt 248 lb (112.5 kg)   SpO2 98%   BMI 45.36 kg/m   Assessment and Plan: 1. Annual physical exam Continue to work on diet, exercise as able - POCT urinalysis dipstick  2. Encounter for screening mammogram for breast cancer - MM 3D SCREEN BREAST BILATERAL; Future  3. Essential hypertension controlled - CBC with Differential/Platelet - TSH  4. Hyperlipidemia associated with type 2 diabetes mellitus ([place]) On statin therapy - Lipid panel  5. Type II diabetes mellitus with ophthalmic manifestations, uncontrolled ([facility]) Continue current regimen - Comprehensive metabolic panel - Hemoglobin A1c  6. COPD, moderate ([place]) Stable, followed by Pulmonary  7. Morbid obesity ([place])  8. Tobacco abuse, in remission  9. Abdominal aortic ectasia ([facility]) Followed by Vascular surgery  10. Iliac artery aneurysm, bilateral ([place]) Followed by Vascular surgery  11. Need for influenza  vaccination - Flu vaccine HIGH DOSE PF  12. Need for shingles vaccine Pt to get at pharmacy - Zoster Vaccine Adjuvanted [facility]) injection; Inject 0.5 mLs into the muscle once for 1 dose.  Dispense: 0.5 mL; Refill: 1  13. Idiopathic osteoporosis Continue Fosamax Pt declines DEXA   Meds ordered this encounter  Medications  . Zoster Vaccine Adjuvanted [facility]) injection    Sig: Inject 0.5 mLs into the muscle once for 1 dose.    Dispense:  0.5 mL    Refill:  1    Partially dictated using Editor, commissioning. Any errors are unintentional.  Halina Maidens, MD [facility]  12/28/2017

## 2017-12-29 LAB — COMPREHENSIVE METABOLIC PANEL
A/G RATIO: 2 (ref 1.2–2.2)
ALT: 27 IU/L (ref 0–32)
AST: 24 IU/L (ref 0–40)
Albumin: 4.4 g/dL (ref 3.5–4.8)
Alkaline Phosphatase: 64 IU/L (ref 39–117)
BUN/Creatinine Ratio: 24 (ref 12–28)
BUN: 16 mg/dL (ref 8–27)
Bilirubin Total: 0.4 mg/dL (ref 0.0–1.2)
CO2: 21 mmol/L (ref 20–29)
Calcium: 9.5 mg/dL (ref 8.7–10.3)
Chloride: 99 mmol/L (ref 96–106)
Creatinine, Ser: 0.66 mg/dL (ref 0.57–1.00)
GFR calc non Af Amer: 89 mL/min/{1.73_m2} (ref >59)
GFR, EST AFRICAN AMERICAN: 102 mL/min/{1.73_m2} (ref >59)
GLOBULIN, TOTAL: 2.2 g/dL (ref 1.5–4.5)
Glucose: 302 mg/dL — ABNORMAL HIGH (ref 65–99)
POTASSIUM: 5 mmol/L (ref 3.5–5.2)
SODIUM: 139 mmol/L (ref 134–144)
Total Protein: 6.6 g/dL (ref 6.0–8.5)

## 2017-12-29 LAB — CBC WITH DIFFERENTIAL/PLATELET
BASOS: 1 %
Basophils Absolute: 0 10*3/uL (ref 0.0–0.2)
EOS (ABSOLUTE): 0.1 10*3/uL (ref 0.0–0.4)
Eos: 2 %
Hematocrit: 41.2 % (ref 34.0–46.6)
Hemoglobin: 13.3 g/dL (ref 11.1–15.9)
IMMATURE GRANS (ABS): 0 10*3/uL (ref 0.0–0.1)
Immature Granulocytes: 0 %
LYMPHS: 21 %
Lymphocytes Absolute: 1 10*3/uL (ref 0.7–3.1)
MCH: 28.8 pg (ref 26.6–33.0)
MCHC: 32.3 g/dL (ref 31.5–35.7)
MCV: 89 fL (ref 79–97)
MONOCYTES: 7 %
Monocytes Absolute: 0.3 10*3/uL (ref 0.1–0.9)
NEUTROS ABS: 3.2 10*3/uL (ref 1.4–7.0)
Neutrophils: 69 %
Platelets: 192 10*3/uL (ref 150–450)
RBC: 4.62 x10E6/uL (ref 3.77–5.28)
RDW: 13.1 % (ref 12.3–15.4)
WBC: 4.6 10*3/uL (ref 3.4–10.8)

## 2017-12-29 LAB — LIPID PANEL
CHOLESTEROL TOTAL: 155 mg/dL (ref 100–199)
Chol/HDL Ratio: 3.8 ratio (ref 0.0–4.4)
HDL: 41 mg/dL (ref >39)
LDL CALC: 91 mg/dL (ref 0–99)
Triglycerides: 114 mg/dL (ref 0–149)
VLDL CHOLESTEROL CAL: 23 mg/dL (ref 5–40)

## 2017-12-29 LAB — HEMOGLOBIN A1C
Est. average glucose Bld gHb Est-mCnc: 278 mg/dL
Hgb A1c MFr Bld: 11.3 % — ABNORMAL HIGH (ref 4.8–5.6)

## 2017-12-29 LAB — TSH: TSH: 3.2 u[IU]/mL (ref 0.450–4.500)

## 2017-12-30 ENCOUNTER — Other Ambulatory Visit: Payer: Self-pay | Admitting: Internal Medicine

## 2017-12-30 DIAGNOSIS — IMO0002 Reserved for concepts with insufficient information to code with codable children: Secondary | ICD-10-CM

## 2017-12-30 DIAGNOSIS — E1165 Type 2 diabetes mellitus with hyperglycemia: Principal | ICD-10-CM

## 2017-12-30 DIAGNOSIS — E1139 Type 2 diabetes mellitus with other diabetic ophthalmic complication: Secondary | ICD-10-CM

## 2017-12-30 MED ORDER — DULAGLUTIDE 0.75 MG/0.5ML ~~LOC~~ SOAJ: 0.7500 mg | SUBCUTANEOUS | 5 refills | Status: DC

## 2017-12-30 NOTE — Progress Notes (Signed)
Pt informed of labs. Would like injection medication to be sent to [facility]. Please Advise. F/up in January to have recheck lab.

## 2018-01-07 ENCOUNTER — Other Ambulatory Visit: Payer: Self-pay | Admitting: Internal Medicine

## 2018-01-09 ENCOUNTER — Ambulatory Visit (INDEPENDENT_AMBULATORY_CARE_PROVIDER_SITE_OTHER): Payer: Medicare Other

## 2018-01-09 VITALS — BP 140/70 | HR 82 | Temp 97.5°F | Resp 14 | Ht 62.0 in | Wt 254.4 lb

## 2018-01-09 DIAGNOSIS — Z0000 Encounter for general adult medical examination without abnormal findings: Secondary | ICD-10-CM | POA: Diagnosis not present

## 2018-01-09 NOTE — Progress Notes (Signed)
Subjective:   Molly Hoover is a 72 y.o. female who presents for Medicare Annual (Subsequent) preventive examination.  Review of Systems:  N/A Cardiac Risk Factors include: advanced age (>10men, >49 women);diabetes mellitus;dyslipidemia;sedentary lifestyle;obesity (BMI >30kg/m2);hypertension     Objective:     Vitals: BP 140/70 (BP Location: Right Arm, Patient Position: Sitting, Cuff Size: Large)   Pulse 82   Temp (!) 97.5 F (36.4 C) (Oral)   Resp 14   Ht 5\' 2"  (1.575 m)   Wt 254 lb 6.4 oz (115.4 kg)   SpO2 94%   BMI 46.53 kg/m   Body mass index is 46.53 kg/m.  Advanced Directives 01/09/2018 02/04/2017 01/03/2017 12/17/2016 08/06/2016 12/19/2014 12/19/2014  Does Patient Have a Medical Advance Directive? No No No No No No No  Would patient like information on creating a medical advance directive? No - Patient declined - No - Patient declined - - No - patient declined information No - patient declined information    Tobacco Social History   Tobacco Use  Smoking Status Former Smoker  . Packs/day: 2.00  . Years: 15.00  . Pack years: 30.00  . Types: Cigarettes  . Last attempt to quit: 1989  . Years since quitting: 30.7  Smokeless Tobacco Never Used  Tobacco Comment   smoking cessation materials not required     Counseling given: No Comment: smoking cessation materials not required  Clinical Intake:  Pre-visit preparation completed: Yes  Pain : No/denies pain   BMI - recorded: 46.53 Nutritional Status: BMI > 30  Obese Nutritional Risks: None  Nutrition Risk Assessment: Has the patient had any N/V/D within the last 2 months?  No Does the patient have any non-healing wounds?  No Has the patient had any unintentional weight loss or weight gain?  No  Is the patient diabetic?  Yes If diabetic, was a CBG obtained today?  No Did the patient bring in their glucometer from home?  No Comments: Pt monitors CBG's twice daily. Denies any financial strains with the  device or supplies.  Diabetic Exams: Diabetic Eye Exam: Completed 11/01/16. Overdue for diabetic eye exam. Pt states she is scheduled to be seen in November 2019 Diabetic Foot Exam: Completed 12/28/17.   How often do you need to have someone help you when you read instructions, pamphlets, or other written materials from your doctor or pharmacy?: 1 - Never  Interpreter Needed?: No  Information entered by :: Idell Pickles, LPN  Past Medical History:  Diagnosis Date  . Allergic rhinitis   . Anxiety   . Asthma   . Bilateral breast cysts   . COPD (chronic obstructive pulmonary disease) ([place])   . Degenerative disc disease   . Depression   . Diabetes mellitus without complication ([place])   . Diabetic peripheral neuropathy ([place])   . Diverticulosis   . Enlarged heart   . GERD (gastroesophageal reflux disease)   . Hiatal hernia   . Hyperlipidemia   . Hypertension   . Hypothyroidism   . PVD (peripheral vascular disease) ([place])   . Syncope and collapse   . Vitamin D deficiency    Past Surgical History:  Procedure Laterality Date  . BREAST BIOPSY    . CARPAL TUNNEL RELEASE     right  . CERVICAL FUSION  2002  . CHOLECYSTECTOMY    . ESOPHAGOGASTRODUODENOSCOPY  03/2014   benign stricture dilated  . HERNIA REPAIR    . LUMBAR FUSION     L1-4   . REPLACEMENT TOTAL  KNEE Left 12/22/208  . TOTAL ABDOMINAL HYSTERECTOMY     Family History  Problem Relation Age of Onset  . Heart attack Mother 27  . Hypertension Mother   . Heart attack Father    Social History   Socioeconomic History  . Marital status: Married    Spouse name: Not on file  . Number of children: 1  . Years of education: Not on file  . Highest education level: 11th grade  Occupational History  . Occupation: Retired  Scientific laboratory technician  . Financial resource strain: Not hard at all  . Food insecurity:    Worry: Never true    Inability: Never true  . Transportation needs:    Medical: No    Non-medical: No  Tobacco Use  .  Smoking status: Former Smoker    Packs/day: 2.00    Years: 15.00    Pack years: 30.00    Types: Cigarettes    Last attempt to quit: 1989    Years since quitting: 30.7  . Smokeless tobacco: Never Used  . Tobacco comment: smoking cessation materials not required  Substance and Sexual Activity  . Alcohol use: Yes    Comment: rare  . Drug use: No  . Sexual activity: Never  Lifestyle  . Physical activity:    Days per week: 0 days    Minutes per session: 0 min  . Stress: Not at all  Relationships  . Social connections:    Talks on phone: Patient refused    Gets together: Patient refused    Attends religious service: Patient refused    Active member of club or organization: Patient refused    Attends meetings of clubs or organizations: Patient refused    Relationship status: Married  Other Topics Concern  . Not on file  Social History Narrative  . Not on file    Outpatient Encounter Medications as of 01/09/2018  Medication Sig  . albuterol (PROVENTIL HFA;VENTOLIN HFA) 108 (90 Base) MCG/ACT inhaler Inhale 2 puffs into the lungs every 6 (six) hours as needed for wheezing or shortness of breath.  . Albuterol Sulfate (PROAIR RESPICLICK) 811 (90 Base) MCG/ACT AEPB Inhale 1-2 puffs into the lungs every 4 (four) hours as needed.  Marland Kitchen alendronate (FOSAMAX) 70 MG tablet TAKE 1 TABLET EVERY WEEK  . atorvastatin (LIPITOR) 20 MG tablet TAKE 1 TABLET AT BEDTIME  . B-D UF III MINI PEN NEEDLES 31G X 5 MM MISC USE 1 NEEDLE FOUR TIMES A DAY  . Cranberry 500 MG CAPS Take 500 mg by mouth daily.   . cyclobenzaprine (FLEXERIL) 10 MG tablet Take 1 tablet (10 mg total) by mouth 3 (three) times daily as needed for muscle spasms.  . diclofenac (VOLTAREN) 75 MG EC tablet   . Dulaglutide (TRULICITY) 9.14 NW/2.9FA SOPN Inject 0.75 mg into the skin once a week.  . DULERA 200-5 MCG/ACT AERO USE 1 INHALATION TWICE A DAY  . gemfibrozil (LOPID) 600 MG tablet TAKE 1 TABLET TWICE A DAY  . glucose blood (ONE TOUCH  ULTRA TEST) test strip Use 4 times per day to test blood sugar.  Marland Kitchen HUMALOG KWIKPEN 100 UNIT/ML KiwkPen INJECT 30 UNITS INTO THE SKIN 3 TIMES DAILY WITH MEALS  . Insulin Detemir (LEVEMIR FLEXTOUCH) 100 UNIT/ML Pen Inject 40 Units into the skin 2 (two) times daily. Patient is to inject 40 units in the morning, and 30 units in the evening.  Marland Kitchen JANUVIA 100 MG tablet TAKE 1 TABLET DAILY  . losartan (COZAAR) 25 MG  tablet TAKE 1 TABLET DAILY  . metFORMIN (GLUCOPHAGE) 500 MG tablet TAKE 1 TABLET TWICE A DAY  . omeprazole (PRILOSEC) 20 MG capsule TAKE 1 CAPSULE DAILY  . ONETOUCH DELICA LANCETS 78G MISC 1 each by Does not apply route 2 (two) times daily.  Marland Kitchen tiotropium (SPIRIVA HANDIHALER) 18 MCG inhalation capsule Place 1 capsule (18 mcg total) into inhaler and inhale daily.  . Vitamin D, Ergocalciferol, (DRISDOL) 50000 units CAPS capsule TAKE 1 CAPSULE TWICE WEEKLY   Facility-Administered Encounter Medications as of 01/09/2018  Medication  . albuterol (PROVENTIL) (2.5 MG/3ML) 0.083% nebulizer solution 2.5 mg    Activities of Daily Living In your present state of health, do you have any difficulty performing the following activities: 01/09/2018  Hearing? N  Comment denis hearing aids  Vision? N  Comment wears eyeglasses  Difficulty concentrating or making decisions? N  Walking or climbing stairs? Y  Comment dyspnea, back pain, ambulates with cane  Dressing or bathing? N  Doing errands, shopping? N  Preparing Food and eating ? N  Comment full set upper and lower dentures  Using the Toilet? N  In the past six months, have you accidently leaked urine? N  Do you have problems with loss of bowel control? N  Managing your Medications? N  Managing your Finances? N  Housekeeping or managing your Housekeeping? N  Some recent data might be hidden    Patient Care Team: Glean Hess, MD as PCP - General (Internal Medicine) Oh, Lupita Dawn, MD (Inactive) as Consulting Physician (Internal  Medicine) Minna Merritts, MD as Consulting Physician (Cardiology) Anell Barr, OD as Consulting Physician (Optometry) Laverle Hobby, MD as Consulting Physician (Pulmonary Disease) Dew, Erskine Squibb, MD as Consulting Physician (Vascular Surgery) Brendolyn Patty, MD as Consulting Physician (Dermatology)    Assessment:   This is a routine wellness examination for [place].  Exercise Activities and Dietary recommendations Current Exercise Habits: The patient does not participate in regular exercise at present, Exercise limited by: None identified  Goals    . DIET - REDUCE SUGAR INTAKE     Recommend eating 3 small healthy meals, low carb, low sugar, and at least 2 healthy snacks per day.       Fall Risk Fall Risk  01/09/2018 12/28/2017 01/03/2017 12/31/2016 12/10/2015  Falls in the past year? No No No No No  Risk for fall due to : Impaired vision;Impaired balance/gait;History of fall(s) - - - -  Risk for fall due to: Comment wears eyeglasses; ambulates with cane - - - -   FALL RISK PREVENTION PERTAINING TO HOME: Is your home free of loose throw rugs in walkways, pet beds, electrical cords, etc? Yes Is there adequate lighting in your home to reduce risk of falls?  Yes Are there stairs in or around your home WITH handrails? Yes  ASSISTIVE DEVICES UTILIZED TO PREVENT FALLS: Use of a cane, walker or w/c? Yes, cane Grab bars in the bathroom? Yes  Shower chair or a place to sit while bathing? Yes An elevated toilet seat or a handicapped toilet? Yes  Timed Get Up and Go Performed: Yes. Pt ambulated 10 feet within 22 sec. Gait slow, steady and with the use of an assistive device. No intervention required at this time. Fall risk prevention has been discussed.  Community Resource Referral:  [place] Referral not required at this time.   Depression Screen PHQ 2/9 Scores 01/09/2018 12/28/2017 01/03/2017 12/31/2016  PHQ - 2 Score 0 0 0 0  PHQ-  9 Score 0 - - -     Cognitive Function      6CIT Screen 01/09/2018 01/03/2017  What Year? 0 points 0 points  What month? 0 points 0 points  What time? 0 points 0 points  Count back from 20 0 points 0 points  Months in reverse 0 points 0 points  Repeat phrase 0 points 0 points  Total Score 0 0    Immunization History  Administered Date(s) Administered  . Influenza, High Dose Seasonal PF 12/28/2017  . Influenza,inj,Quad PF,6+ Mos 01/02/2015, 12/31/2016  . Pneumococcal Conjugate-13 03/15/2014  . Pneumococcal Polysaccharide-23 04/27/2008, 12/31/2016  . Zoster 04/27/2008    Qualifies for Shingles Vaccine? Yes. Zostavax completed 04/27/08. Due for Shingrix. Education has been provided regarding the importance of this vaccine. Pt has been advised to call insurance company to determine out of pocket expense. Advised may also receive vaccine at local pharmacy or Health Dept. Verbalized acceptance and understanding.  Screening Tests Health Maintenance  Topic Date Due  . OPHTHALMOLOGY EXAM  11/01/2017  . DEXA SCAN  01/10/2019 (Originally 03/09/2010)  . MAMMOGRAM  01/26/2018  . HEMOGLOBIN A1C  06/28/2018  . FOOT EXAM  12/29/2018  . COLONOSCOPY  04/27/2021  . TETANUS/TDAP  12/25/2021  . INFLUENZA VACCINE  Completed  . Hepatitis C Screening  Completed  . PNA vac Low Risk Adult  Completed    Cancer Screenings: Lung: Low Dose CT Chest recommended if Age 89-80 years, 30 pack-year currently smoking OR have quit w/in 15years. Patient does not qualify. Breast:  Up to date on Mammogram? No. Completed 01/26/17. Repeat every year. Ordered 12/28/17. Pt aware that she will receive a call from our office re: appt   Up to date of Bone Density/Dexa? No. Declined my offer to order this screening. Education provided regarding importance of this screening but still declined. Colorectal: Completed 04/28/11. Repeat every 10 years  Additional Screenings: Hepatitis C Screening: Completed 12/31/16    Plan:  I have personally reviewed and addressed the  Medicare Annual Wellness questionnaire and have noted the following in the patient's chart:  A. Medical and social history B. Use of alcohol, tobacco or illicit drugs  C. Current medications and supplements D. Functional ability and status E.  Nutritional status F.  Physical activity G. Advance directives H. List of other physicians I.  Hospitalizations, surgeries, and ER visits in previous 12 months J.  [place] such as hearing and vision if needed, cognitive and depression L. Referrals and appointments  In addition, I have reviewed and discussed with patient certain preventive protocols, quality metrics, and best practice recommendations. A written personalized care plan for preventive services as well as general preventive health recommendations were provided to patient.  Signed,  Aleatha Borer, LPN Nurse Health Advisor  MD Recommendations: Zostavax completed 04/27/08. Due for Shingrix. Education has been provided regarding the importance of this vaccine. Pt has been advised to call insurance company to determine out of pocket expense. Advised may also receive vaccine at local pharmacy or Health Dept. Verbalized acceptance and understanding.  Mammogram: Completed 01/26/17. Repeat every year. Ordered 12/28/17. Pt aware that she will receive a call from our office re: appt    Bone Density/Dexa: Declined my offer to order this screening. Education provided regarding importance of this screening but still declined.  Diabetic Eye Exam: Completed 11/01/16. Overdue for diabetic eye exam. Pt states she is scheduled to be seen in November 2019

## 2018-01-09 NOTE — Patient Instructions (Signed)
Molly Hoover , Thank you for taking time to come for your Medicare Wellness Visit. I appreciate your ongoing commitment to your health goals. Please review the following plan we discussed and let me know if I can assist you in the future.   Screening recommendations/referrals: Colorectal Screening: Up to date Mammogram: Up to date Bone Density: Declined  Vision and Dental Exams: Recommended annual ophthalmology exams for early detection of glaucoma and other disorders of the eye Recommended annual dental exams for proper oral hygiene  Diabetic Exams: Diabetic Eye Exam: Please keep your appointment as scheduled with Dr. Ellin Mayhew Diabetic Foot Exam: Up to date  Vaccinations: Influenza vaccine: Up to date Pneumococcal vaccine: Up to date Tdap vaccine: Up to date Shingles vaccine: Please call your insurance company to determine your out of pocket expense for the Shingrix vaccine. You may receive this vaccine at your local pharmacy.  Advanced directives: Advance directives discussed with you today. You have declined my offer to provide you with documents for completion.  Goals: Recommend to decrease portion sizes by eating 3 small healthy meals and at least 2 healthy snacks per day.  Next appointment: Please schedule your Annual Wellness Visit with your Nurse Health Advisor in one year.  Preventive Care 45 Years and Older, Female Preventive care refers to lifestyle choices and visits with your health care provider that can promote health and wellness. What does preventive care include?  A yearly physical exam. This is also called an annual well check.  Dental exams once or twice a year.  Routine eye exams. Ask your health care provider how often you should have your eyes checked.  Personal lifestyle choices, including:  Daily care of your teeth and gums.  Regular physical activity.  Eating a healthy diet.  Avoiding tobacco and drug use.  Limiting alcohol use.  Practicing  safe sex.  Taking low-dose aspirin every day.  Taking vitamin and mineral supplements as recommended by your health care provider. What happens during an annual well check? The services and screenings done by your health care provider during your annual well check will depend on your age, overall health, lifestyle risk factors, and family history of disease. Counseling  Your health care provider may ask you questions about your:  Alcohol use.  Tobacco use.  Drug use.  Emotional well-being.  Home and relationship well-being.  Sexual activity.  Eating habits.  History of falls.  Memory and ability to understand (cognition).  Work and work Statistician.  Reproductive health. Screening  You may have the following tests or measurements:  Height, weight, and BMI.  Blood pressure.  Lipid and cholesterol levels. These may be checked every 5 years, or more frequently if you are over 68 years old.  Skin check.  Lung cancer screening. You may have this screening every year starting at age 41 if you have a 30-pack-year history of smoking and currently smoke or have quit within the past 15 years.  Fecal occult blood test (FOBT) of the stool. You may have this test every year starting at age 71.  Flexible sigmoidoscopy or colonoscopy. You may have a sigmoidoscopy every 5 years or a colonoscopy every 10 years starting at age 82.  Hepatitis C blood test.  Hepatitis B blood test.  Sexually transmitted disease (STD) testing.  Diabetes screening. This is done by checking your blood sugar (glucose) after you have not eaten for a while (fasting). You may have this done every 1-3 years.  Bone density scan. This is done  to screen for osteoporosis. You may have this done starting at age 52.  Mammogram. This may be done every 1-2 years. Talk to your health care provider about how often you should have regular mammograms. Talk with your health care provider about your test results,  treatment options, and if necessary, the need for more tests. Vaccines  Your health care provider may recommend certain vaccines, such as:  Influenza vaccine. This is recommended every year.  Tetanus, diphtheria, and acellular pertussis (Tdap, Td) vaccine. You may need a Td booster every 10 years.  Zoster vaccine. You may need this after age 60.  Pneumococcal 13-valent conjugate (PCV13) vaccine. One dose is recommended after age 44.  Pneumococcal polysaccharide (PPSV23) vaccine. One dose is recommended after age 53. Talk to your health care provider about which screenings and vaccines you need and how often you need them. This information is not intended to replace advice given to you by your health care provider. Make sure you discuss any questions you have with your health care provider. Document Released: 05/09/2015 Document Revised: 12/31/2015 Document Reviewed: 02/11/2015 Elsevier Interactive Patient Education  2017 [employer] Prevention in the Home Falls can cause injuries. They can happen to people of all ages. There are many things you can do to make your home safe and to help prevent falls. What can I do on the outside of my home?  Regularly fix the edges of walkways and driveways and fix any cracks.  Remove anything that might make you trip as you walk through a door, such as a raised step or threshold.  Trim any bushes or trees on the path to your home.  Use bright outdoor lighting.  Clear any walking paths of anything that might make someone trip, such as rocks or tools.  Regularly check to see if handrails are loose or broken. Make sure that both sides of any steps have handrails.  Any raised decks and porches should have guardrails on the edges.  Have any leaves, snow, or ice cleared regularly.  Use sand or salt on walking paths during winter.  Clean up any spills in your garage right away. This includes oil or grease spills. What can I do in the  bathroom?  Use night lights.  Install grab bars by the toilet and in the tub and shower. Do not use towel bars as grab bars.  Use non-skid mats or decals in the tub or shower.  If you need to sit down in the shower, use a plastic, non-slip stool.  Keep the floor dry. Clean up any water that spills on the floor as soon as it happens.  Remove soap buildup in the tub or shower regularly.  Attach bath mats securely with double-sided non-slip rug tape.  Do not have throw rugs and other things on the floor that can make you trip. What can I do in the bedroom?  Use night lights.  Make sure that you have a light by your bed that is easy to reach.  Do not use any sheets or blankets that are too big for your bed. They should not hang down onto the floor.  Have a firm chair that has side arms. You can use this for support while you get dressed.  Do not have throw rugs and other things on the floor that can make you trip. What can I do in the kitchen?  Clean up any spills right away.  Avoid walking on wet floors.  Keep items  that you use a lot in easy-to-reach places.  If you need to reach something above you, use a strong step stool that has a grab bar.  Keep electrical cords out of the way.  Do not use floor polish or wax that makes floors slippery. If you must use wax, use non-skid floor wax.  Do not have throw rugs and other things on the floor that can make you trip. What can I do with my stairs?  Do not leave any items on the stairs.  Make sure that there are handrails on both sides of the stairs and use them. Fix handrails that are broken or loose. Make sure that handrails are as long as the stairways.  Check any carpeting to make sure that it is firmly attached to the stairs. Fix any carpet that is loose or worn.  Avoid having throw rugs at the top or bottom of the stairs. If you do have throw rugs, attach them to the floor with carpet tape.  Make sure that you have a  light switch at the top of the stairs and the bottom of the stairs. If you do not have them, ask someone to add them for you. What else can I do to help prevent falls?  Wear shoes that:  Do not have high heels.  Have rubber bottoms.  Are comfortable and fit you well.  Are closed at the toe. Do not wear sandals.  If you use a stepladder:  Make sure that it is fully opened. Do not climb a closed stepladder.  Make sure that both sides of the stepladder are locked into place.  Ask someone to hold it for you, if possible.  Clearly mark and make sure that you can see:  Any grab bars or handrails.  First and last steps.  Where the edge of each step is.  Use tools that help you move around (mobility aids) if they are needed. These include:  Canes.  Walkers.  Scooters.  Crutches.  Turn on the lights when you go into a dark area. Replace any light bulbs as soon as they burn out.  Set up your furniture so you have a clear path. Avoid moving your furniture around.  If any of your floors are uneven, fix them.  If there are any pets around you, be aware of where they are.  Review your medicines with your doctor. Some medicines can make you feel dizzy. This can increase your chance of falling. Ask your doctor what other things that you can do to help prevent falls. This information is not intended to replace advice given to you by your health care provider. Make sure you discuss any questions you have with your health care provider. Document Released: 02/06/2009 Document Revised: 09/18/2015 Document Reviewed: 05/17/2014 Elsevier Interactive Patient Education  2017 [employer].

## 2018-02-05 ENCOUNTER — Other Ambulatory Visit: Payer: Self-pay | Admitting: Internal Medicine

## 2018-02-05 DIAGNOSIS — E1165 Type 2 diabetes mellitus with hyperglycemia: Principal | ICD-10-CM

## 2018-02-05 DIAGNOSIS — IMO0001 Reserved for inherently not codable concepts without codable children: Secondary | ICD-10-CM

## 2018-02-05 DIAGNOSIS — Z794 Long term (current) use of insulin: Principal | ICD-10-CM

## 2018-02-14 ENCOUNTER — Encounter (INDEPENDENT_AMBULATORY_CARE_PROVIDER_SITE_OTHER): Payer: Medicare Other

## 2018-02-14 ENCOUNTER — Ambulatory Visit (INDEPENDENT_AMBULATORY_CARE_PROVIDER_SITE_OTHER): Payer: Medicare Other | Admitting: Vascular Surgery

## 2018-02-28 ENCOUNTER — Encounter (INDEPENDENT_AMBULATORY_CARE_PROVIDER_SITE_OTHER): Payer: Self-pay | Admitting: Vascular Surgery

## 2018-02-28 ENCOUNTER — Ambulatory Visit (INDEPENDENT_AMBULATORY_CARE_PROVIDER_SITE_OTHER): Payer: Medicare Other | Admitting: Vascular Surgery

## 2018-02-28 ENCOUNTER — Ambulatory Visit (INDEPENDENT_AMBULATORY_CARE_PROVIDER_SITE_OTHER): Payer: Medicare Other

## 2018-02-28 ENCOUNTER — Other Ambulatory Visit (INDEPENDENT_AMBULATORY_CARE_PROVIDER_SITE_OTHER): Payer: Self-pay | Admitting: Vascular Surgery

## 2018-02-28 VITALS — BP 174/87 | HR 82 | Resp 16 | Ht 62.0 in | Wt 244.6 lb

## 2018-02-28 DIAGNOSIS — E1139 Type 2 diabetes mellitus with other diabetic ophthalmic complication: Secondary | ICD-10-CM | POA: Diagnosis not present

## 2018-02-28 DIAGNOSIS — I714 Abdominal aortic aneurysm, without rupture, unspecified: Secondary | ICD-10-CM

## 2018-02-28 DIAGNOSIS — I723 Aneurysm of iliac artery: Secondary | ICD-10-CM

## 2018-02-28 DIAGNOSIS — I10 Essential (primary) hypertension: Secondary | ICD-10-CM | POA: Diagnosis not present

## 2018-02-28 DIAGNOSIS — E1165 Type 2 diabetes mellitus with hyperglycemia: Secondary | ICD-10-CM

## 2018-02-28 DIAGNOSIS — E1169 Type 2 diabetes mellitus with other specified complication: Secondary | ICD-10-CM | POA: Diagnosis not present

## 2018-02-28 DIAGNOSIS — E785 Hyperlipidemia, unspecified: Secondary | ICD-10-CM

## 2018-02-28 DIAGNOSIS — IMO0002 Reserved for concepts with insufficient information to code with codable children: Secondary | ICD-10-CM

## 2018-02-28 DIAGNOSIS — Z87891 Personal history of nicotine dependence: Secondary | ICD-10-CM

## 2018-02-28 NOTE — Assessment & Plan Note (Signed)
She is studied today with duplex which shows normal triphasic waveforms throughout both lower extremities without focal stenosis.  The aortoiliac duplex was somewhat limited due to bowel gas, but the maximal diameter of the iliac arteries was in the 1.4-1.5 range bilaterally without significant progression from previous studies. No role for intervention at this level.  Continue to check annually

## 2018-02-28 NOTE — Progress Notes (Signed)
MRN : [id number]  Molly Hoover is a 72 y.o. (17-Feb-1946) female who presents with chief complaint of  Chief Complaint  Patient presents with  . Follow-up    60month ultrasound follow up  .  History of Present Illness: Patient returns today in follow up of iliac artery aneurysms and leg pain.  She is studied today with duplex which shows normal triphasic waveforms throughout both lower extremities without focal stenosis.  The aortoiliac duplex was somewhat limited due to bowel gas, but the maximal diameter of the iliac arteries was in the 1.4-1.5 range bilaterally without significant progression from previous studies.  She is doing well with no new complaints today.  No ulceration or rest pain of the lower extremities.  No aneurysm related symptoms. Specifically, the patient denies new back or abdominal pain, or signs of peripheral embolization   Current Outpatient Medications  Medication Sig Dispense Refill  . albuterol (PROVENTIL HFA;VENTOLIN HFA) 108 (90 Base) MCG/ACT inhaler Inhale 2 puffs into the lungs every 6 (six) hours as needed for wheezing or shortness of breath. 1 Inhaler 3  . Albuterol Sulfate (PROAIR RESPICLICK) 413 (90 Base) MCG/ACT AEPB Inhale 1-2 puffs into the lungs every 4 (four) hours as needed. 1 each 2  . alendronate (FOSAMAX) 70 MG tablet TAKE 1 TABLET EVERY WEEK 12 tablet 3  . atorvastatin (LIPITOR) 20 MG tablet TAKE 1 TABLET AT BEDTIME 90 tablet 3  . B-D UF III MINI PEN NEEDLES 31G X 5 MM MISC USE 1 NEEDLE FOUR TIMES A DAY 360 each 4  . Cranberry 500 MG CAPS Take 500 mg by mouth daily.     . cyclobenzaprine (FLEXERIL) 10 MG tablet Take 1 tablet (10 mg total) by mouth 3 (three) times daily as needed for muscle spasms. 40 tablet 0  . diclofenac (VOLTAREN) 75 MG EC tablet     . Dulaglutide (TRULICITY) 2.44 WN/0.2VO SOPN Inject 0.75 mg into the skin once a week. 4 pen 5  . DULERA 200-5 MCG/ACT AERO USE 1 INHALATION TWICE A DAY 39 g 3  . gemfibrozil (LOPID) 600  MG tablet TAKE 1 TABLET TWICE A DAY 180 tablet 4  . glucose blood (ONE TOUCH ULTRA TEST) test strip Use 4 times per day to test blood sugar. 400 each 3  . HUMALOG KWIKPEN 100 UNIT/ML KiwkPen INJECT 30 UNITS INTO THE SKIN 3 TIMES DAILY WITH MEALS 30 mL 11  . Insulin Detemir (LEVEMIR FLEXTOUCH) 100 UNIT/ML Pen Inject 40 Units into the skin 2 (two) times daily. Patient is to inject 40 units in the morning, and 30 units in the evening. 30 mL 11  . JANUVIA 100 MG tablet TAKE 1 TABLET DAILY 90 tablet 3  . losartan (COZAAR) 25 MG tablet TAKE 1 TABLET DAILY 90 tablet 4  . metFORMIN (GLUCOPHAGE) 500 MG tablet TAKE 1 TABLET TWICE A DAY 180 tablet 1  . omeprazole (PRILOSEC) 20 MG capsule TAKE 1 CAPSULE DAILY 90 capsule 3  . ONETOUCH DELICA LANCETS 53G MISC 1 each by Does not apply route 2 (two) times daily. 100 each 5  . tiotropium (SPIRIVA HANDIHALER) 18 MCG inhalation capsule Place 1 capsule (18 mcg total) into inhaler and inhale daily. 90 capsule 1  . Vitamin D, Ergocalciferol, (DRISDOL) 50000 units CAPS capsule TAKE 1 CAPSULE TWICE WEEKLY 24 capsule 3   Current Facility-Administered Medications  Medication Dose Route Frequency Provider Last Rate Last Dose  . albuterol (PROVENTIL) (2.5 MG/3ML) 0.083% nebulizer solution 2.5 mg  2.5 mg  Nebulization Once Glean Hess, MD        Past Medical History:  Diagnosis Date  . Allergic rhinitis   . Anxiety   . Asthma   . Bilateral breast cysts   . COPD (chronic obstructive pulmonary disease) ([place])   . Degenerative disc disease   . Depression   . Diabetes mellitus without complication ([place])   . Diabetic peripheral neuropathy ([place])   . Diverticulosis   . Enlarged heart   . GERD (gastroesophageal reflux disease)   . Hiatal hernia   . Hyperlipidemia   . Hypertension   . Hypothyroidism   . PVD (peripheral vascular disease) ([place])   . Syncope and collapse   . Vitamin D deficiency     Past Surgical History:  Procedure Laterality Date  . BREAST  BIOPSY    . CARPAL TUNNEL RELEASE     right  . CERVICAL FUSION  2002  . CHOLECYSTECTOMY    . ESOPHAGOGASTRODUODENOSCOPY  03/2014   benign stricture dilated  . HERNIA REPAIR    . LUMBAR FUSION     L1-4   . 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 TOTAL ABDOMINAL HYSTERECTOMY      Social History Social History   Tobacco Use  . Smoking status: Former Smoker    Packs/day: 2.00    Years: 15.00    Pack years: 30.00    Types: Cigarettes    Last attempt to quit: 1989    Years since quitting: 30.8  . Smokeless tobacco: Never Used  . Tobacco comment: smoking cessation materials not required  Substance Use Topics  . Alcohol use: Yes    Comment: rare  . Drug use: No     Family History Family History  Problem Relation Age of Onset  . Heart attack Mother 67  . Hypertension Mother   . Heart attack Father     Allergies  Allergen Reactions  . Codeine Hives and Itching  . Combivent [Ipratropium-Albuterol] Itching  . Ivp Dye [Iodinated Diagnostic Agents] Hives and Itching  . Meloxicam    REVIEW OF SYSTEMS(Negative unless checked)  Constitutional: [] Weight loss[] Fever[] Chills Cardiac:[] Chest pain[] Chest pressure[] Palpitations [] Shortness of breath when laying flat [] Shortness of breath at rest [] Shortness of breath with exertion. Vascular: [x] Pain in legs with walking[x] Pain in legsat rest[x] Pain in legs when laying flat [] Claudication [] Pain in feet when walking [] Pain in feet at rest [] Pain in feet when laying flat [] History of DVT [] Phlebitis [] Swelling in legs [] Varicose veins [] Non-healing ulcers Pulmonary: [] Uses home oxygen [] Productive cough[] Hemoptysis [] Wheeze [] COPD [] Asthma Neurologic: [] Dizziness [] Blackouts [] Seizures [] History of stroke [] History of TIA[] Aphasia [] Temporary blindness[] Dysphagia [] Weaknessor numbness in arms [] Weakness or numbnessin legs Musculoskeletal: [x] Arthritis  [] Joint swelling [x] Joint pain [] Low back pain Hematologic:[] Easy bruising[] Easy bleeding [] Hypercoagulable state [] Anemic  Gastrointestinal:[] Blood in stool[] Vomiting blood[] Gastroesophageal reflux/heartburn[] Abdominal pain Genitourinary: [] Chronic kidney disease [] Difficulturination [] Frequenturination [] Burning with urination[] Hematuria Skin: [] Rashes [] Ulcers [] Wounds Psychological: [] History of anxiety[] History of major depression.   Physical Examination  BP (!) 174/87 (BP Location: Right Arm)   Pulse 82   Resp 16   Ht 5\' 2"  (1.575 m)   Wt 244 lb 9.6 oz (110.9 kg)   BMI 44.74 kg/m  Gen:  WD/WN, NAD Head: [place]/AT, No temporalis wasting. Ear/Nose/Throat: Hearing grossly intact, nares w/o erythema or drainage Eyes: Conjunctiva clear. Sclera non-icteric Neck: Supple.  Trachea midline Pulmonary:  Good air movement, no use of accessory muscles.  Cardiac: RRR, no JVD Vascular:  Vessel Right Left  Radial Palpable Palpable  PT Palpable Palpable  DP Palpable Palpable   Musculoskeletal: M/S 5/5 throughout.  No deformity or atrophy.  Mild bilateral lower extremity edema. Neurologic: Sensation grossly intact in extremities.  Symmetrical.  Speech is fluent.  Psychiatric: Judgment intact, Mood & affect appropriate for pt's clinical situation. Dermatologic: No rashes or ulcers noted.  No cellulitis or open wounds.       Labs Recent Results (from the past 2160 hour(s))  POCT urinalysis dipstick     Status: Abnormal   Collection Time: 12/28/17  9:07 AM  Result Value Ref Range   Color, UA yellow    Clarity, UA cloudy     Comment: numerous epithelial cells   Glucose, UA Positive (A) Negative    Comment: 250+   Bilirubin, UA ne    Ketones, UA neg    Spec Grav, UA 1.020 1.010 - 1.025   Blood, UA neg    pH, UA 6.5 5.0 - 8.0   Protein, UA Negative Negative   Urobilinogen, UA 0.2 0.2 or 1.0 E.U./dL    Nitrite, UA neg    Leukocytes, UA Large (3+) (A) Negative   Appearance cloudy    Odor none   CBC with Differential/Platelet     Status: None   Collection Time: 12/28/17  9:10 AM  Result Value Ref Range   WBC 4.6 3.4 - 10.8 x10E3/uL   RBC 4.62 3.77 - 5.28 x10E6/uL   Hemoglobin 13.3 11.1 - 15.9 g/dL   Hematocrit 41.2 34.0 - 46.6 %   MCV 89 79 - 97 fL   MCH 28.8 26.6 - 33.0 pg   MCHC 32.3 31.5 - 35.7 g/dL   RDW 13.1 12.3 - 15.4 %   Platelets 192 150 - 450 x10E3/uL   Neutrophils 69 Not Estab. %   Lymphs 21 Not Estab. %   Monocytes 7 Not Estab. %   Eos 2 Not Estab. %   Basos 1 Not Estab. %   Neutrophils Absolute 3.2 1.4 - 7.0 x10E3/uL   Lymphocytes Absolute 1.0 0.7 - 3.1 x10E3/uL   Monocytes Absolute 0.3 0.1 - 0.9 x10E3/uL   EOS (ABSOLUTE) 0.1 0.0 - 0.4 x10E3/uL   Basophils Absolute 0.0 0.0 - 0.2 x10E3/uL   Immature Granulocytes 0 Not Estab. %   Immature Grans (Abs) 0.0 0.0 - 0.1 x10E3/uL  Comprehensive metabolic panel     Status: Abnormal   Collection Time: 12/28/17  9:10 AM  Result Value Ref Range   Glucose 302 (H) 65 - 99 mg/dL   BUN 16 8 - 27 mg/dL   Creatinine, Ser 0.66 0.57 - 1.00 mg/dL   GFR calc non Af Amer 89 >59 mL/min/1.73   GFR calc Af Amer 102 >59 mL/min/1.73   BUN/Creatinine Ratio 24 12 - 28   Sodium 139 134 - 144 mmol/L   Potassium 5.0 3.5 - 5.2 mmol/L   Chloride 99 96 - 106 mmol/L   CO2 21 20 - 29 mmol/L   Calcium 9.5 8.7 - 10.3 mg/dL   Total Protein 6.6 6.0 - 8.5 g/dL   Albumin 4.4 3.5 - 4.8 g/dL   Globulin, Total 2.2 1.5 - 4.5 g/dL   Albumin/Globulin Ratio 2.0 1.2 - 2.2   Bilirubin Total 0.4 0.0 - 1.2 mg/dL   Alkaline Phosphatase 64 39 - 117 IU/L   AST 24 0 - 40 IU/L   ALT 27 0 - 32 IU/L  Hemoglobin A1c     Status: Abnormal   Collection Time: 12/28/17  9:10 AM  Result Value  Ref Range   Hgb A1c MFr Bld 11.3 (H) 4.8 - 5.6 %    Comment:          Prediabetes: 5.7 - 6.4          Diabetes: >6.4          Glycemic control for adults with diabetes: <7.0      Est. average glucose Bld gHb Est-mCnc 278 mg/dL  Lipid panel     Status: None   Collection Time: 12/28/17  9:10 AM  Result Value Ref Range   Cholesterol, Total 155 100 - 199 mg/dL   Triglycerides 114 0 - 149 mg/dL   HDL 41 >39 mg/dL   VLDL Cholesterol Cal 23 5 - 40 mg/dL   LDL Calculated 91 0 - 99 mg/dL   Chol/HDL Ratio 3.8 0.0 - 4.4 ratio    Comment:                                   T. Chol/HDL Ratio                                             Men  Women                               1/2 Avg.Risk  3.4    3.3                                   Avg.Risk  5.0    4.4                                2X Avg.Risk  9.6    7.1                                3X Avg.Risk 23.4   11.0   TSH     Status: None   Collection Time: 12/28/17  9:10 AM  Result Value Ref Range   TSH 3.200 0.450 - 4.500 uIU/mL    Radiology No results found.  Assessment/Plan Hyperlipidemia associated with type 2 diabetes mellitus (HCC) blood glucose control important in reducing the progression of atherosclerotic disease. Also, involved in wound healing. On appropriate medications.   Essential hypertension blood pressure control important in reducing the progression of atherosclerotic disease and aneurysmal degeneration. On appropriate oral medications.  Type II diabetes mellitus with ophthalmic manifestations, uncontrolled (HCC) blood glucose control important in reducing the progression of atherosclerotic disease. Also, involved in wound healing. On appropriate medications.   Iliac artery aneurysm, bilateral ([place]) She is studied today with duplex which shows normal triphasic waveforms throughout both lower extremities without focal stenosis.  The aortoiliac duplex was somewhat limited due to bowel gas, but the maximal diameter of the iliac arteries was in the 1.4-1.5 range bilaterally without significant progression from previous studies. No role for intervention at this level.  Continue to check  annually    Leotis Pain, MD  02/28/2018 11:03 AM    This note was created with Dragon medical transcription system.  Any errors from dictation  are purely unintentional

## 2018-02-28 NOTE — Assessment & Plan Note (Signed)
blood glucose control important in reducing the progression of atherosclerotic disease. Also, involved in wound healing. On appropriate medications.  

## 2018-03-15 LAB — HM DIABETES EYE EXAM

## 2018-03-21 ENCOUNTER — Encounter: Payer: Self-pay | Admitting: Internal Medicine

## 2018-04-19 ENCOUNTER — Other Ambulatory Visit: Payer: Self-pay | Admitting: Internal Medicine

## 2018-05-01 ENCOUNTER — Ambulatory Visit (INDEPENDENT_AMBULATORY_CARE_PROVIDER_SITE_OTHER): Payer: Medicare Other | Admitting: Internal Medicine

## 2018-05-01 ENCOUNTER — Encounter: Payer: Self-pay | Admitting: Internal Medicine

## 2018-05-01 VITALS — BP 140/86 | HR 82 | Ht 62.0 in | Wt 246.2 lb

## 2018-05-01 DIAGNOSIS — E1165 Type 2 diabetes mellitus with hyperglycemia: Secondary | ICD-10-CM | POA: Diagnosis not present

## 2018-05-01 DIAGNOSIS — I10 Essential (primary) hypertension: Secondary | ICD-10-CM

## 2018-05-01 DIAGNOSIS — IMO0002 Reserved for concepts with insufficient information to code with codable children: Secondary | ICD-10-CM

## 2018-05-01 DIAGNOSIS — E1139 Type 2 diabetes mellitus with other diabetic ophthalmic complication: Secondary | ICD-10-CM

## 2018-05-01 NOTE — Progress Notes (Signed)
Date:  05/01/2018   Name:  Molly Hoover   DOB:  Nov 26, 1945   MRN:  [id number]   Chief Complaint: Diabetes (Follow up. BS- 324 this morning. ); Hypertension; and Depression (States she is not depressed just stressed and constantly worrying about her husband. )  Diabetes  She presents for her follow-up diabetic visit. She has type 2 diabetes mellitus. Pertinent negatives for hypoglycemia include no headaches or tremors. Pertinent negatives for diabetes include no chest pain, no fatigue, no polydipsia and no polyuria. Current diabetic treatments: metformin, januvia, trulicity and insulin. Compliance with diabetes treatment: Trulicity added after last visit. She is following a generally healthy diet. Her home blood glucose trend is decreasing steadily. An ACE inhibitor/angiotensin II receptor blocker is being taken.  Hypertension  This is a chronic problem. The problem is controlled. Pertinent negatives include no chest pain, headaches, palpitations or shortness of breath. Past treatments include angiotensin blockers. The current treatment provides significant improvement.   Lab Results  Component Value Date   HGBA1C 11.3 (H) 12/28/2017   Lab Results  Component Value Date   CREATININE 0.66 12/28/2017   BUN 16 12/28/2017   NA 139 12/28/2017   K 5.0 12/28/2017   CL 99 12/28/2017   CO2 21 12/28/2017     Review of Systems  Constitutional: Negative for appetite change, fatigue, fever and unexpected weight change.  HENT: Negative for tinnitus and trouble swallowing.   Eyes: Negative for visual disturbance.  Respiratory: Negative for cough, chest tightness and shortness of breath.   Cardiovascular: Negative for chest pain, palpitations and leg swelling.  Gastrointestinal: Negative for abdominal pain.  Endocrine: Negative for polydipsia and polyuria.  Genitourinary: Negative for dysuria and hematuria.  Musculoskeletal: Negative for arthralgias.  Skin: Positive for color change.  Negative for rash.  Neurological: Negative for tremors, numbness and headaches.  Psychiatric/Behavioral: Negative for dysphoric mood.    Patient Active Problem List   Diagnosis Date Noted  . Spinal stenosis of lumbar region 12/31/2016  . Hand pain, right 08/12/2016  . Presence of left artificial knee joint 06/10/2016  . 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 Abdominal aortic ectasia ([place]) 12/11/2015  . Osteoarthritis of both knees 12/10/2015  . Arthritis of hand 10/06/2015  . Type II diabetes mellitus with ophthalmic manifestations, uncontrolled ([place]) 04/03/2015  . COPD, moderate ([place]) 11/19/2014  . Edema extremities 11/19/2014  . H/O gastrointestinal disease 11/19/2014  . Hyperlipidemia associated with type 2 diabetes mellitus ([place]) 11/19/2014  . Muscle spasms of head and/or neck 11/19/2014  . Idiopathic osteoporosis 11/19/2014  . 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 Venous insufficiency of leg 11/19/2014  . Morbid obesity ([place]) 10/17/2013  . Essential hypertension 10/17/2013  . Chronic cough 10/17/2013    Allergies  Allergen Reactions  . Codeine Hives and Itching  . Combivent [Ipratropium-Albuterol] Itching  . Ivp Dye [Iodinated Diagnostic Agents] Hives and Itching  . Meloxicam     Past Surgical History:  Procedure Laterality Date  . BREAST BIOPSY    . CARPAL TUNNEL RELEASE     right  . CERVICAL FUSION  2002  . CHOLECYSTECTOMY    . ESOPHAGOGASTRODUODENOSCOPY  03/2014   benign stricture dilated  . HERNIA REPAIR    . LUMBAR FUSION     L1-4   . 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 TOTAL ABDOMINAL HYSTERECTOMY      Social History   Tobacco Use  . Smoking status: Former Smoker    Packs/day: 2.00  Years: 15.00    Pack years: 30.00    Types: Cigarettes    Last attempt to quit: 1989    Years since quitting: 31.0  . Smokeless tobacco: Never Used  . Tobacco comment: smoking cessation materials not required  Substance Use Topics  . Alcohol  use: Yes    Comment: rare  . Drug use: No     Medication list has been reviewed and updated.  Current Meds  Medication Sig  . albuterol (PROVENTIL HFA;VENTOLIN HFA) 108 (90 Base) MCG/ACT inhaler Inhale 2 puffs into the lungs every 6 (six) hours as needed for wheezing or shortness of breath.  . Albuterol Sulfate (PROAIR RESPICLICK) 191 (90 Base) MCG/ACT AEPB Inhale 1-2 puffs into the lungs every 4 (four) hours as needed.  Marland Kitchen alendronate (FOSAMAX) 70 MG tablet TAKE 1 TABLET EVERY WEEK  . atorvastatin (LIPITOR) 20 MG tablet TAKE 1 TABLET AT BEDTIME  . B-D UF III MINI PEN NEEDLES 31G X 5 MM MISC USE 1 NEEDLE FOUR TIMES A DAY  . Cranberry 500 MG CAPS Take 500 mg by mouth daily.   . cyclobenzaprine (FLEXERIL) 10 MG tablet Take 1 tablet (10 mg total) by mouth 3 (three) times daily as needed for muscle spasms.  . diclofenac (VOLTAREN) 75 MG EC tablet   . Dulaglutide (TRULICITY) 4.78 GN/5.6OZ SOPN Inject 0.75 mg into the skin once a week.  . DULERA 200-5 MCG/ACT AERO USE 1 INHALATION TWICE A DAY  . gemfibrozil (LOPID) 600 MG tablet TAKE 1 TABLET TWICE A DAY  . glucose blood (ONE TOUCH ULTRA TEST) test strip Use 4 times per day to test blood sugar.  Marland Kitchen HUMALOG KWIKPEN 100 UNIT/ML KiwkPen INJECT 30 UNITS INTO THE SKIN 3 TIMES DAILY WITH MEALS  . Insulin Detemir (LEVEMIR FLEXTOUCH) 100 UNIT/ML Pen Inject 40 Units into the skin 2 (two) times daily. Patient is to inject 40 units in the morning, and 30 units in the evening.  Marland Kitchen JANUVIA 100 MG tablet TAKE 1 TABLET DAILY  . losartan (COZAAR) 25 MG tablet TAKE 1 TABLET DAILY  . metFORMIN (GLUCOPHAGE) 500 MG tablet TAKE 1 TABLET TWICE A DAY  . omeprazole (PRILOSEC) 20 MG capsule TAKE 1 CAPSULE DAILY  . ONETOUCH DELICA LANCETS 30Q MISC 1 each by Does not apply route 2 (two) times daily.  Marland Kitchen tiotropium (SPIRIVA HANDIHALER) 18 MCG inhalation capsule Place 1 capsule (18 mcg total) into inhaler and inhale daily.  . Vitamin D, Ergocalciferol, (DRISDOL) 50000 units  CAPS capsule TAKE 1 CAPSULE TWICE WEEKLY   Current Facility-Administered Medications for the 05/01/18 encounter (Office Visit) with Glean Hess, MD  Medication  . albuterol (PROVENTIL) (2.5 MG/3ML) 0.083% nebulizer solution 2.5 mg    PHQ 2/9 Scores 05/01/2018 01/09/2018 12/28/2017 01/03/2017  PHQ - 2 Score 0 0 0 0  PHQ- 9 Score - 0 - -    Physical Exam Vitals signs and nursing note reviewed.  Constitutional:      General: She is not in acute distress.    Appearance: She is well-developed.  HENT:     Head: Normocephalic and atraumatic.     Nose: Nose normal.  Neck:     Musculoskeletal: Normal range of motion and neck supple.  Cardiovascular:     Rate and Rhythm: Normal rate and regular rhythm.  Pulmonary:     Effort: Pulmonary effort is normal. No respiratory distress.     Breath sounds: Normal breath sounds. No stridor.  Musculoskeletal: Normal range of motion.  Lymphadenopathy:  Cervical: No cervical adenopathy.  Skin:    General: Skin is warm and dry.     Findings: No rash.  Neurological:     Mental Status: She is alert and oriented to person, place, and time.  Psychiatric:        Behavior: Behavior normal.        Thought Content: Thought content normal.    Wt Readings from Last 3 Encounters:  05/01/18 246 lb 3.2 oz (111.7 kg)  02/28/18 244 lb 9.6 oz (110.9 kg)  01/09/18 254 lb 6.4 oz (115.4 kg)    BP 140/86 (BP Location: Right Arm, Patient Position: Sitting, Cuff Size: Large)   Pulse 82   Ht 5\' 2"  (1.575 m)   Wt 246 lb 3.2 oz (111.7 kg)   SpO2 97%   BMI 45.03 kg/m   Assessment and Plan: 1. Type II diabetes mellitus with ophthalmic manifestations, uncontrolled ([place]) Improved with addition of Trulicity; weight is down 8 lbs - Comprehensive metabolic panel - Hemoglobin A1c  2. Essential hypertension controlled - Comprehensive metabolic panel     Partially dictated using Dragon software. Any errors are unintentional.  Halina Maidens, MD [facility]  05/01/2018

## 2018-05-02 ENCOUNTER — Other Ambulatory Visit: Payer: Self-pay | Admitting: Internal Medicine

## 2018-05-02 DIAGNOSIS — E1165 Type 2 diabetes mellitus with hyperglycemia: Principal | ICD-10-CM

## 2018-05-02 DIAGNOSIS — IMO0002 Reserved for concepts with insufficient information to code with codable children: Secondary | ICD-10-CM

## 2018-05-02 DIAGNOSIS — E1139 Type 2 diabetes mellitus with other diabetic ophthalmic complication: Secondary | ICD-10-CM

## 2018-05-02 LAB — COMPREHENSIVE METABOLIC PANEL
ALT: 23 IU/L (ref 0–32)
AST: 28 IU/L (ref 0–40)
Albumin/Globulin Ratio: 2 (ref 1.2–2.2)
Albumin: 4.5 g/dL (ref 3.5–4.8)
Alkaline Phosphatase: 66 IU/L (ref 39–117)
BUN/Creatinine Ratio: 13 (ref 12–28)
BUN: 9 mg/dL (ref 8–27)
Bilirubin Total: 0.5 mg/dL (ref 0.0–1.2)
CO2: 21 mmol/L (ref 20–29)
Calcium: 9.8 mg/dL (ref 8.7–10.3)
Chloride: 100 mmol/L (ref 96–106)
Creatinine, Ser: 0.69 mg/dL (ref 0.57–1.00)
GFR calc Af Amer: 100 mL/min/{1.73_m2} (ref >59)
GFR calc non Af Amer: 87 mL/min/{1.73_m2} (ref >59)
Globulin, Total: 2.3 g/dL (ref 1.5–4.5)
Glucose: 242 mg/dL — ABNORMAL HIGH (ref 65–99)
Potassium: 4.8 mmol/L (ref 3.5–5.2)
Sodium: 140 mmol/L (ref 134–144)
Total Protein: 6.8 g/dL (ref 6.0–8.5)

## 2018-05-02 LAB — HEMOGLOBIN A1C
Est. average glucose Bld gHb Est-mCnc: 200 mg/dL
Hgb A1c MFr Bld: 8.6 % — ABNORMAL HIGH (ref 4.8–5.6)

## 2018-05-02 MED ORDER — DULAGLUTIDE 1.5 MG/0.5ML ~~LOC~~ SOAJ: 1.5000 mg | SUBCUTANEOUS | 3 refills | Status: DC

## 2018-05-06 ENCOUNTER — Other Ambulatory Visit: Payer: Self-pay | Admitting: Internal Medicine

## 2018-05-24 ENCOUNTER — Other Ambulatory Visit: Payer: Self-pay | Admitting: Internal Medicine

## 2018-06-17 ENCOUNTER — Other Ambulatory Visit: Payer: Self-pay | Admitting: Internal Medicine

## 2018-06-17 DIAGNOSIS — E1165 Type 2 diabetes mellitus with hyperglycemia: Principal | ICD-10-CM

## 2018-06-17 DIAGNOSIS — IMO0001 Reserved for inherently not codable concepts without codable children: Secondary | ICD-10-CM

## 2018-06-17 DIAGNOSIS — Z794 Long term (current) use of insulin: Principal | ICD-10-CM

## 2018-07-04 ENCOUNTER — Other Ambulatory Visit: Payer: Self-pay

## 2018-07-04 DIAGNOSIS — Z794 Long term (current) use of insulin: Principal | ICD-10-CM

## 2018-07-04 DIAGNOSIS — E1165 Type 2 diabetes mellitus with hyperglycemia: Principal | ICD-10-CM

## 2018-07-04 DIAGNOSIS — IMO0001 Reserved for inherently not codable concepts without codable children: Secondary | ICD-10-CM

## 2018-07-04 MED ORDER — INSULIN LISPRO (1 UNIT DIAL) 100 UNIT/ML (KWIKPEN): PEN_INJECTOR | SUBCUTANEOUS | 5 refills | Status: DC

## 2018-07-04 MED ORDER — INSULIN DETEMIR 100 UNIT/ML FLEXPEN: 40.0000 [IU] | PEN_INJECTOR | Freq: Two times a day (BID) | SUBCUTANEOUS | 5 refills | Status: DC

## 2018-07-13 ENCOUNTER — Other Ambulatory Visit: Payer: Self-pay | Admitting: Internal Medicine

## 2018-07-13 DIAGNOSIS — E1165 Type 2 diabetes mellitus with hyperglycemia: Principal | ICD-10-CM

## 2018-07-13 DIAGNOSIS — IMO0001 Reserved for inherently not codable concepts without codable children: Secondary | ICD-10-CM

## 2018-07-13 DIAGNOSIS — Z794 Long term (current) use of insulin: Principal | ICD-10-CM

## 2018-07-13 MED ORDER — INSULIN DETEMIR 100 UNIT/ML FLEXPEN: 40.0000 [IU] | PEN_INJECTOR | Freq: Two times a day (BID) | SUBCUTANEOUS | 5 refills | Status: DC

## 2018-07-29 ENCOUNTER — Other Ambulatory Visit: Payer: Self-pay | Admitting: Internal Medicine

## 2018-07-29 DIAGNOSIS — E1165 Type 2 diabetes mellitus with hyperglycemia: Principal | ICD-10-CM

## 2018-07-29 DIAGNOSIS — IMO0001 Reserved for inherently not codable concepts without codable children: Secondary | ICD-10-CM

## 2018-07-29 DIAGNOSIS — Z794 Long term (current) use of insulin: Principal | ICD-10-CM

## 2018-08-08 ENCOUNTER — Telehealth: Payer: Self-pay

## 2018-08-08 ENCOUNTER — Other Ambulatory Visit: Payer: Self-pay

## 2018-08-08 MED ORDER — MOMETASONE FURO-FORMOTEROL FUM 200-5 MCG/ACT IN AERO: INHALATION_SPRAY | RESPIRATORY_TRACT | 3 refills | Status: DC

## 2018-08-08 NOTE — Telephone Encounter (Signed)
Patient called saying she is requesting refill for her [employer] inhaler. Wants it to go to [facility].   Refilled patients inhaler.

## 2018-08-22 ENCOUNTER — Telehealth: Payer: Self-pay

## 2018-08-22 ENCOUNTER — Other Ambulatory Visit: Payer: Self-pay

## 2018-08-22 DIAGNOSIS — IMO0002 Reserved for concepts with insufficient information to code with codable children: Secondary | ICD-10-CM

## 2018-08-22 DIAGNOSIS — Z794 Long term (current) use of insulin: Principal | ICD-10-CM

## 2018-08-22 DIAGNOSIS — IMO0001 Reserved for inherently not codable concepts without codable children: Secondary | ICD-10-CM

## 2018-08-22 DIAGNOSIS — E1139 Type 2 diabetes mellitus with other diabetic ophthalmic complication: Secondary | ICD-10-CM

## 2018-08-22 DIAGNOSIS — E1165 Type 2 diabetes mellitus with hyperglycemia: Principal | ICD-10-CM

## 2018-08-22 MED ORDER — GLUCOSE BLOOD VI STRP: ORAL_STRIP | 3 refills | Status: DC

## 2018-08-22 MED ORDER — ONETOUCH DELICA LANCETS 33G MISC: 1.0000 | Freq: Two times a day (BID) | 5 refills | Status: DC

## 2018-08-22 NOTE — Telephone Encounter (Signed)
Patient increased Trulicity.Marland Kitchen Rescheduled to June

## 2018-08-22 NOTE — Telephone Encounter (Signed)
Patient called saying she wanted to know if she can move her appt to a later date. She does not come out of the house due to virus. Said her husband only goes to grocery store and that is all.  Please advise? I can move her to June for 6 month follow up?? Her last a1c was 8.6 and better from 11.3 but I know this is still not good.

## 2018-08-22 NOTE — Telephone Encounter (Signed)
If she increased the Trulicity injection after her last visit, then she can reschedule until June.

## 2018-08-30 ENCOUNTER — Ambulatory Visit: Payer: Self-pay | Admitting: Internal Medicine

## 2018-09-01 ENCOUNTER — Encounter: Payer: Self-pay | Admitting: Internal Medicine

## 2018-10-16 ENCOUNTER — Other Ambulatory Visit: Payer: Self-pay | Admitting: Internal Medicine

## 2018-10-20 ENCOUNTER — Encounter: Payer: Self-pay | Admitting: Internal Medicine

## 2018-10-20 ENCOUNTER — Other Ambulatory Visit: Payer: Self-pay

## 2018-10-20 ENCOUNTER — Ambulatory Visit (INDEPENDENT_AMBULATORY_CARE_PROVIDER_SITE_OTHER): Payer: Medicare Other | Admitting: Internal Medicine

## 2018-10-20 VITALS — BP 126/68 | HR 75 | Ht 62.0 in | Wt 247.0 lb

## 2018-10-20 DIAGNOSIS — E118 Type 2 diabetes mellitus with unspecified complications: Secondary | ICD-10-CM

## 2018-10-20 DIAGNOSIS — I77811 Abdominal aortic ectasia: Secondary | ICD-10-CM | POA: Diagnosis not present

## 2018-10-20 DIAGNOSIS — E785 Hyperlipidemia, unspecified: Secondary | ICD-10-CM

## 2018-10-20 DIAGNOSIS — I10 Essential (primary) hypertension: Secondary | ICD-10-CM | POA: Diagnosis not present

## 2018-10-20 DIAGNOSIS — E1169 Type 2 diabetes mellitus with other specified complication: Secondary | ICD-10-CM

## 2018-10-20 NOTE — Progress Notes (Signed)
Date:  10/20/2018   Name:  Molly Hoover   DOB:  June 17, 1945   MRN:  [id number]   Chief Complaint: Diabetes  Diabetes She presents for her follow-up diabetic visit. She has type 2 diabetes mellitus. Her disease course has been worsening. Pertinent negatives for hypoglycemia include no headaches or tremors. Pertinent negatives for diabetes include no chest pain, no fatigue, no polydipsia and no polyuria. Current diabetic treatment includes insulin injections and oral agent (monotherapy) (Trulicity added last visit). She is compliant with treatment most of the time. Her weight is stable. She monitors blood glucose at home 1-2 x per day. Her breakfast blood glucose is taken between 7-8 am. Her breakfast blood glucose range is generally 180-200 mg/dl.  Hypertension Associated symptoms include shortness of breath. Pertinent negatives include no chest pain, headaches or palpitations.  Hyperlipidemia Associated symptoms include shortness of breath. Pertinent negatives include no chest pain.  Abdominal aortic ectasia - followed by [facility].  She denies claudication, change in mild leg edema, abdominal pain.  Last [place] in 02/2018 was unchanged and recommended yearly follow up.  Lab Results  Component Value Date   HGBA1C 8.6 (H) 05/01/2018   Lab Results  Component Value Date   CREATININE 0.69 05/01/2018   BUN 9 05/01/2018   NA 140 05/01/2018   K 4.8 05/01/2018   CL 100 05/01/2018   CO2 21 05/01/2018   Lab Results  Component Value Date   WBC 4.6 12/28/2017   HGB 13.3 12/28/2017   HCT 41.2 12/28/2017   MCV 89 12/28/2017   PLT 192 12/28/2017   Lab Results  Component Value Date   TSH 3.200 12/28/2017   Lab Results  Component Value Date   CHOL 155 12/28/2017   HDL 41 12/28/2017   LDLCALC 91 12/28/2017   TRIG 114 12/28/2017   CHOLHDL 3.8 12/28/2017     Review of Systems  Constitutional: Negative for appetite change, fatigue, fever and unexpected weight change.  HENT: Negative  for tinnitus and trouble swallowing.   Eyes: Negative for visual disturbance.  Respiratory: Positive for shortness of breath. Negative for cough, chest tightness and wheezing.   Cardiovascular: Negative for chest pain, palpitations and leg swelling.  Gastrointestinal: Negative for abdominal pain.  Endocrine: Negative for polydipsia and polyuria.  Genitourinary: Negative for dysuria and hematuria.  Musculoskeletal: Positive for arthralgias, back pain and gait problem.  Allergic/Immunologic: Negative for environmental allergies.  Neurological: Negative for tremors, numbness and headaches.  Psychiatric/Behavioral: Negative for dysphoric mood.    Patient Active Problem List   Diagnosis Date Noted  . Spinal stenosis of lumbar region 12/31/2016  . Hand pain, right 08/12/2016  . Presence of left artificial knee joint 06/10/2016  . 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 Abdominal aortic ectasia ([place]) 12/11/2015  . Osteoarthritis of right knee 12/10/2015  . Arthritis of hand 10/06/2015  . COPD, moderate ([place]) 11/19/2014  . Edema extremities 11/19/2014  . H/O gastrointestinal disease 11/19/2014  . Hyperlipidemia associated with type 2 diabetes mellitus ([place]) 11/19/2014  . Muscle spasms of head and/or neck 11/19/2014  . Idiopathic osteoporosis 11/19/2014  . 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 Venous insufficiency of leg 11/19/2014  . Type II diabetes mellitus with complication ([place]) 76/16/0737  . Morbid obesity ([place]) 10/17/2013  . Essential hypertension 10/17/2013  . Chronic cough 10/17/2013    Allergies  Allergen Reactions  . Codeine Hives and Itching  . Combivent [Ipratropium-Albuterol] Itching  . Ivp Dye [Iodinated Diagnostic Agents] Hives  and Itching  . Meloxicam     Past Surgical History:  Procedure Laterality Date  . BREAST BIOPSY    . CARPAL TUNNEL RELEASE     right  . CERVICAL FUSION  2002  . CHOLECYSTECTOMY    . ESOPHAGOGASTRODUODENOSCOPY  03/2014    benign stricture dilated  . HERNIA REPAIR    . LUMBAR FUSION     L1-4   . 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 TOTAL ABDOMINAL HYSTERECTOMY      Social History   Tobacco Use  . Smoking status: Former Smoker    Packs/day: 2.00    Years: 15.00    Pack years: 30.00    Types: Cigarettes    Quit date: 1989    Years since quitting: 31.5  . Smokeless tobacco: Never Used  . Tobacco comment: smoking cessation materials not required  Substance Use Topics  . Alcohol use: Yes    Comment: rare  . Drug use: No     Medication list has been reviewed and updated.  Current Meds  Medication Sig  . albuterol (PROVENTIL HFA;VENTOLIN HFA) 108 (90 Base) MCG/ACT inhaler Inhale 2 puffs into the lungs every 6 (six) hours as needed for wheezing or shortness of breath.  . Albuterol Sulfate (PROAIR RESPICLICK) 086 (90 Base) MCG/ACT AEPB Inhale 1-2 puffs into the lungs every 4 (four) hours as needed.  Marland Kitchen alendronate (FOSAMAX) 70 MG tablet TAKE 1 TABLET EVERY WEEK  . atorvastatin (LIPITOR) 20 MG tablet TAKE 1 TABLET AT BEDTIME  . B-D UF III MINI PEN NEEDLES 31G X 5 MM MISC USE 1 NEEDLE FOUR TIMES A DAY  . Cranberry 500 MG CAPS Take 500 mg by mouth daily.   . cyclobenzaprine (FLEXERIL) 10 MG tablet Take 1 tablet (10 mg total) by mouth 3 (three) times daily as needed for muscle spasms.  . diclofenac (VOLTAREN) 75 MG EC tablet   . Dulaglutide (TRULICITY) 1.5 VH/8.4ON SOPN Inject 1.5 mg into the skin once a week.  Marland Kitchen gemfibrozil (LOPID) 600 MG tablet TAKE 1 TABLET TWICE A DAY  . glucose blood (ONE TOUCH ULTRA TEST) test strip Use 4 times per day to test blood sugar.  . insulin lispro (HUMALOG KWIKPEN) 100 UNIT/ML KwikPen INJECT 30 UNITS INTO THE SKIN 3 TIMES DAILY WITH MEALS AS DIRECTED  . JANUVIA 100 MG tablet TAKE 1 TABLET DAILY  . LEVEMIR FLEXTOUCH 100 UNIT/ML Pen INJECT 40 UNITS IN THE MORNING AND 30 UNITS IN THE EVENING  . losartan (COZAAR) 25 MG tablet TAKE 1 TABLET DAILY  . metFORMIN (GLUCOPHAGE)  500 MG tablet TAKE 1 TABLET TWICE A DAY  . mometasone-formoterol (DULERA) 200-5 MCG/ACT AERO USE 1 INHALATION TWICE A DAY  . omeprazole (PRILOSEC) 20 MG capsule TAKE 1 CAPSULE DAILY  . OneTouch Delica Lancets 62X MISC 1 each by Does not apply route 2 (two) times daily.  Marland Kitchen tiotropium (SPIRIVA HANDIHALER) 18 MCG inhalation capsule Place 1 capsule (18 mcg total) into inhaler and inhale daily.  . Vitamin D, Ergocalciferol, (DRISDOL) 1.25 MG (50000 UT) CAPS capsule TAKE 1 CAPSULE TWICE WEEKLY   Current Facility-Administered Medications for the 10/20/18 encounter (Office Visit) with Glean Hess, MD  Medication  . albuterol (PROVENTIL) (2.5 MG/3ML) 0.083% nebulizer solution 2.5 mg    PHQ 2/9 Scores 10/20/2018 05/01/2018 01/09/2018 12/28/2017  PHQ - 2 Score 2 0 0 0  PHQ- 9 Score 2 - 0 -    BP Readings from Last 3 Encounters:  10/20/18 126/68  05/01/18 140/86  02/28/18 (!) 174/87    Physical Exam Vitals signs and nursing note reviewed.  Constitutional:      General: She is not in acute distress.    Appearance: She is well-developed.  HENT:     Head: Normocephalic and atraumatic.  Neck:     Musculoskeletal: Normal range of motion and neck supple.  Cardiovascular:     Rate and Rhythm: Normal rate and regular rhythm.     Pulses: Normal pulses.     Heart sounds: No murmur.  Pulmonary:     Effort: Pulmonary effort is normal. No respiratory distress.  Musculoskeletal:     Right lower leg: Edema present.     Left lower leg: Edema present.  Skin:    General: Skin is warm and dry.     Capillary Refill: Capillary refill takes less than 2 seconds.     Findings: No rash.  Neurological:     Mental Status: She is alert and oriented to person, place, and time.     Sensory: Sensation is intact.     Gait: Gait abnormal.  Psychiatric:        Behavior: Behavior normal.        Thought Content: Thought content normal.     Wt Readings from Last 3 Encounters:  10/20/18 247 lb (112 kg)   05/01/18 246 lb 3.2 oz (111.7 kg)  02/28/18 244 lb 9.6 oz (110.9 kg)    BP 126/68   Pulse 75   Ht 5\' 2"  (1.575 m)   Wt 247 lb (112 kg)   SpO2 95%   BMI 45.18 kg/m   Assessment and Plan: 1. Essential hypertension Controlled Continue current therapy  2. Type II diabetes mellitus with complication ([place]) Fairly well controlled on current regimen - Basic metabolic panel - Hemoglobin A1c  3. Hyperlipidemia associated with type 2 diabetes mellitus ([place]) On statin therapy  4. Abdominal aortic ectasia ([facility]) Followed by [facility] [place] 6 month ago was stable   Partially dictated using Editor, commissioning. Any errors are unintentional.  Halina Maidens, MD [facility]  10/20/2018

## 2018-10-21 LAB — BASIC METABOLIC PANEL
BUN/Creatinine Ratio: 23 (ref 12–28)
BUN: 16 mg/dL (ref 8–27)
CO2: 20 mmol/L (ref 20–29)
Calcium: 9.8 mg/dL (ref 8.7–10.3)
Chloride: 102 mmol/L (ref 96–106)
Creatinine, Ser: 0.7 mg/dL (ref 0.57–1.00)
GFR calc Af Amer: 99 mL/min/{1.73_m2} (ref >59)
GFR calc non Af Amer: 86 mL/min/{1.73_m2} (ref >59)
Glucose: 74 mg/dL (ref 65–99)
Potassium: 4.6 mmol/L (ref 3.5–5.2)
Sodium: 142 mmol/L (ref 134–144)

## 2018-10-21 LAB — HEMOGLOBIN A1C
Est. average glucose Bld gHb Est-mCnc: 180 mg/dL
Hgb A1c MFr Bld: 7.9 % — ABNORMAL HIGH (ref 4.8–5.6)

## 2018-12-29 ENCOUNTER — Ambulatory Visit (INDEPENDENT_AMBULATORY_CARE_PROVIDER_SITE_OTHER): Payer: Medicare Other | Admitting: Internal Medicine

## 2018-12-29 ENCOUNTER — Encounter: Payer: Self-pay | Admitting: Internal Medicine

## 2018-12-29 ENCOUNTER — Other Ambulatory Visit: Payer: Self-pay

## 2018-12-29 VITALS — BP 136/78 | HR 80 | Ht 62.0 in | Wt 243.0 lb

## 2018-12-29 DIAGNOSIS — J449 Chronic obstructive pulmonary disease, unspecified: Secondary | ICD-10-CM | POA: Diagnosis not present

## 2018-12-29 DIAGNOSIS — Z23 Encounter for immunization: Secondary | ICD-10-CM

## 2018-12-29 DIAGNOSIS — E118 Type 2 diabetes mellitus with unspecified complications: Secondary | ICD-10-CM | POA: Diagnosis not present

## 2018-12-29 DIAGNOSIS — Z0000 Encounter for general adult medical examination without abnormal findings: Secondary | ICD-10-CM | POA: Diagnosis not present

## 2018-12-29 DIAGNOSIS — E1169 Type 2 diabetes mellitus with other specified complication: Secondary | ICD-10-CM

## 2018-12-29 DIAGNOSIS — Z1231 Encounter for screening mammogram for malignant neoplasm of breast: Secondary | ICD-10-CM

## 2018-12-29 DIAGNOSIS — E785 Hyperlipidemia, unspecified: Secondary | ICD-10-CM

## 2018-12-29 DIAGNOSIS — I10 Essential (primary) hypertension: Secondary | ICD-10-CM

## 2018-12-29 LAB — POCT URINALYSIS DIPSTICK
Bilirubin, UA: NEGATIVE
Blood, UA: NEGATIVE
Glucose, UA: NEGATIVE
Ketones, UA: NEGATIVE
Leukocytes, UA: NEGATIVE
Nitrite, UA: NEGATIVE
Protein, UA: NEGATIVE
Spec Grav, UA: 1.015 (ref 1.010–1.025)
Urobilinogen, UA: 0.2 E.U./dL
pH, UA: 6.5 (ref 5.0–8.0)

## 2018-12-29 MED ORDER — ONETOUCH DELICA LANCETS 33G MISC: 1.0000 | Freq: Two times a day (BID) | 3 refills | Status: DC

## 2018-12-29 MED ORDER — GLUCOSE BLOOD VI STRP: ORAL_STRIP | 3 refills | Status: DC

## 2018-12-29 NOTE — Progress Notes (Signed)
Date:  12/29/2018   Name:  Molly Hoover   DOB:  14-Oct-1945   MRN:  [id number]   Chief Complaint: Annual Exam (Breast Exam.) and Morbid Obesity Molly Hoover is a 73 y.o. female who presents today for her Complete Annual Exam. She feels fairly well. She reports exercising none. She reports she is sleeping well. She denies breast complaints.  Mammogram 01/2018 Colonoscopy  04/2011 Immunizations up to date  Hypertension This is a chronic problem. The problem is controlled. Pertinent negatives include no chest pain, headaches, palpitations, peripheral edema or shortness of breath. Past treatments include angiotensin blockers. The current treatment provides significant improvement. There are no compliance problems.   Diabetes She presents for her follow-up diabetic visit. She has type 2 diabetes mellitus. Hypoglycemia symptoms include dizziness. Pertinent negatives for hypoglycemia include no headaches, nervousness/anxiousness or tremors. Pertinent negatives for diabetes include no chest pain, no fatigue, no polydipsia and no polyuria. There are no hypoglycemic complications. Symptoms are stable. Current diabetic treatment includes oral agent (triple therapy) and intensive insulin program (metformin, Januvia, Trulicity and Insulin). She is compliant with treatment most of the time. She monitors blood glucose at home 1-2 x per day. Her breakfast blood glucose is taken between 6-7 am. Her breakfast blood glucose range is generally 140-180 mg/dl. Her dinner blood glucose is taken between 6-7 pm. Her dinner blood glucose range is generally 140-180 mg/dl. An ACE inhibitor/angiotensin II receptor blocker is being taken.  Hyperlipidemia The problem is controlled. Pertinent negatives include no chest pain or shortness of breath. Current antihyperlipidemic treatment includes statins. The current treatment provides significant improvement of lipids.  COPD - Spiriva and Albuterol MDI.  Her sx are  well controlled using albuterol once a day.  She has not needed to the pulmonologist recently.  Lab Results  Component Value Date   HGBA1C 7.9 (H) 10/20/2018   Lab Results  Component Value Date   CREATININE 0.70 10/20/2018   BUN 16 10/20/2018   NA 142 10/20/2018   K 4.6 10/20/2018   CL 102 10/20/2018   CO2 20 10/20/2018   Lab Results  Component Value Date   CHOL 155 12/28/2017   HDL 41 12/28/2017   LDLCALC 91 12/28/2017   TRIG 114 12/28/2017   CHOLHDL 3.8 12/28/2017   Lab Results  Component Value Date   TSH 3.200 12/28/2017     Review of Systems  Constitutional: Negative for chills, fatigue and fever.  HENT: Negative for congestion, hearing loss, tinnitus, trouble swallowing and voice change.   Eyes: Negative for visual disturbance.  Respiratory: Negative for cough, chest tightness, shortness of breath and wheezing.   Cardiovascular: Negative for chest pain, palpitations and leg swelling.  Gastrointestinal: Negative for abdominal pain, constipation, diarrhea and vomiting.  Endocrine: Negative for polydipsia and polyuria.  Genitourinary: Negative for dysuria, frequency, genital sores, vaginal bleeding and vaginal discharge.  Musculoskeletal: Negative for arthralgias, gait problem and joint swelling.  Skin: Negative for color change and rash.  Allergic/Immunologic: Negative for environmental allergies.  Neurological: Positive for dizziness. Negative for tremors, light-headedness and headaches.  Hematological: Negative for adenopathy. Does not bruise/bleed easily.  Psychiatric/Behavioral: Negative for dysphoric mood and sleep disturbance. The patient is not nervous/anxious.     Patient Active Problem List   Diagnosis Date Noted  . Spinal stenosis of lumbar region 12/31/2016  . Hand pain, right 08/12/2016  . Presence of left artificial knee joint 06/10/2016  . 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 Abdominal aortic  ectasia ([place]) 12/11/2015  . Osteoarthritis  of right knee 12/10/2015  . Arthritis of hand 10/06/2015  . COPD, moderate ([place]) 11/19/2014  . Edema extremities 11/19/2014  . H/O gastrointestinal disease 11/19/2014  . Hyperlipidemia associated with type 2 diabetes mellitus ([place]) 11/19/2014  . Muscle spasms of head and/or neck 11/19/2014  . Idiopathic osteoporosis 11/19/2014  . 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 Venous insufficiency of leg 11/19/2014  . Type II diabetes mellitus with complication ([place]) [id number]  . Morbid obesity ([place]) 10/17/2013  . Essential hypertension 10/17/2013  . Chronic cough 10/17/2013    Allergies  Allergen Reactions  . Codeine Hives and Itching  . Combivent [Ipratropium-Albuterol] Itching  . Ivp Dye [Iodinated Diagnostic Agents] Hives and Itching  . Meloxicam     Past Surgical History:  Procedure Laterality Date  . BREAST BIOPSY    . CARPAL TUNNEL RELEASE     right  . CERVICAL FUSION  2002  . CHOLECYSTECTOMY    . ESOPHAGOGASTRODUODENOSCOPY  03/2014   benign stricture dilated  . HERNIA REPAIR    . LUMBAR FUSION     L1-4   . 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 TOTAL ABDOMINAL HYSTERECTOMY      Social History   Tobacco Use  . Smoking status: Former Smoker    Packs/day: 2.00    Years: 15.00    Pack years: 30.00    Types: Cigarettes    Quit date: 1989    Years since quitting: 31.6  . Smokeless tobacco: Never Used  . Tobacco comment: smoking cessation materials not required  Substance Use Topics  . Alcohol use: Yes    Comment: rare  . Drug use: No     Medication list has been reviewed and updated.  Current Meds  Medication Sig  . albuterol (PROVENTIL HFA;VENTOLIN HFA) 108 (90 Base) MCG/ACT inhaler Inhale 2 puffs into the lungs every 6 (six) hours as needed for wheezing or shortness of breath.  . Albuterol Sulfate (PROAIR RESPICLICK) 123XX123 (90 Base) MCG/ACT AEPB Inhale 1-2 puffs into the lungs every 4 (four) hours as needed.  Marland Kitchen alendronate (FOSAMAX) 70 MG tablet TAKE 1  TABLET EVERY WEEK  . atorvastatin (LIPITOR) 20 MG tablet TAKE 1 TABLET AT BEDTIME  . B-D UF III MINI PEN NEEDLES 31G X 5 MM MISC USE 1 NEEDLE FOUR TIMES A DAY  . Cranberry 500 MG CAPS Take 500 mg by mouth daily.   . cyclobenzaprine (FLEXERIL) 10 MG tablet Take 1 tablet (10 mg total) by mouth 3 (three) times daily as needed for muscle spasms.  . diclofenac (VOLTAREN) 75 MG EC tablet   . Dulaglutide (TRULICITY) 1.5 0000000 SOPN Inject 1.5 mg into the skin once a week.  Marland Kitchen gemfibrozil (LOPID) 600 MG tablet TAKE 1 TABLET TWICE A DAY  . glucose blood (ONE TOUCH ULTRA TEST) test strip Use 4 times per day to test blood sugar.  . insulin lispro (HUMALOG KWIKPEN) 100 UNIT/ML KwikPen INJECT 30 UNITS INTO THE SKIN 3 TIMES DAILY WITH MEALS AS DIRECTED  . JANUVIA 100 MG tablet TAKE 1 TABLET DAILY  . LEVEMIR FLEXTOUCH 100 UNIT/ML Pen INJECT 40 UNITS IN THE MORNING AND 30 UNITS IN THE EVENING  . losartan (COZAAR) 25 MG tablet TAKE 1 TABLET DAILY  . metFORMIN (GLUCOPHAGE) 500 MG tablet TAKE 1 TABLET TWICE A DAY  . mometasone-formoterol (DULERA) 200-5 MCG/ACT AERO USE 1 INHALATION TWICE A DAY  . omeprazole (PRILOSEC) 20 MG capsule TAKE 1 CAPSULE DAILY  .  OneTouch Delica Lancets 99991111 MISC 1 each by Does not apply route 2 (two) times daily.  Marland Kitchen tiotropium (SPIRIVA HANDIHALER) 18 MCG inhalation capsule Place 1 capsule (18 mcg total) into inhaler and inhale daily.  . Vitamin D, Ergocalciferol, (DRISDOL) 1.25 MG (50000 UT) CAPS capsule TAKE 1 CAPSULE TWICE WEEKLY   Current Facility-Administered Medications for the 12/29/18 encounter (Office Visit) with Glean Hess, MD  Medication  . albuterol (PROVENTIL) (2.5 MG/3ML) 0.083% nebulizer solution 2.5 mg    PHQ 2/9 Scores 12/29/2018 10/20/2018 05/01/2018 01/09/2018  PHQ - 2 Score 2 2 0 0  PHQ- 9 Score 5 2 - 0    BP Readings from Last 3 Encounters:  12/29/18 136/78  10/20/18 126/68  05/01/18 140/86    Physical Exam Vitals signs and nursing note reviewed.   Constitutional:      General: She is not in acute distress.    Appearance: She is well-developed. She is obese.  HENT:     Head: Normocephalic and atraumatic.     Right Ear: Tympanic membrane and ear canal normal.     Left Ear: Tympanic membrane and ear canal normal.     Nose:     Right Sinus: No maxillary sinus tenderness.     Left Sinus: No maxillary sinus tenderness.  Eyes:     General: No scleral icterus.       Right eye: No discharge.        Left eye: No discharge.     Conjunctiva/sclera: Conjunctivae normal.  Neck:     Musculoskeletal: Normal range of motion. No erythema.     Thyroid: No thyromegaly.     Vascular: No carotid bruit.  Cardiovascular:     Rate and Rhythm: Normal rate and regular rhythm.     Pulses: Normal pulses.     Heart sounds: Normal heart sounds.  Pulmonary:     Effort: Pulmonary effort is normal. No respiratory distress.     Breath sounds: No wheezing.  Chest:     Breasts:        Right: No mass, nipple discharge, skin change or tenderness.        Left: No mass, nipple discharge, skin change or tenderness.  Abdominal:     General: Bowel sounds are normal.     Palpations: Abdomen is soft.     Tenderness: There is no abdominal tenderness.  Musculoskeletal: Normal range of motion.  Lymphadenopathy:     Cervical: No cervical adenopathy.  Skin:    General: Skin is warm and dry.     Capillary Refill: Capillary refill takes less than 2 seconds.     Findings: No rash.  Neurological:     General: No focal deficit present.     Mental Status: She is alert and oriented to person, place, and time.     Cranial Nerves: No cranial nerve deficit.     Sensory: No sensory deficit.     Gait: Gait abnormal (walks with cane).     Deep Tendon Reflexes: Reflexes are normal and symmetric.  Psychiatric:        Speech: Speech normal.        Behavior: Behavior normal.        Thought Content: Thought content normal.     Wt Readings from Last 3 Encounters:   12/29/18 243 lb (110.2 kg)  10/20/18 247 lb (112 kg)  05/01/18 246 lb 3.2 oz (111.7 kg)    BP 136/78   Pulse 80   Ht 5'  2" (1.575 m)   Wt 243 lb (110.2 kg)   SpO2 98%   BMI 44.45 kg/m   Assessment and Plan: 1. Annual physical exam Normal exam except for weight Continue to work on diet Unable to exercise due to OA - POCT urinalysis dipstick  2. Encounter for screening mammogram for breast cancer To schedule at [place]; Future  3. Essential hypertension Clinically stable exam with well controlled BP.   Tolerating medications, losartan 25 mg, without side effects at this time. Pt to continue current regimen and low sodium diet; benefits of regular exercise as able discussed. - CBC with Differential/Platelet - TSH + free T4  4. COPD, moderate ([place]) Mild stable symptoms well controlled on Spiriva Immunizations up to date  5. Hyperlipidemia associated with type 2 diabetes mellitus ([place]) Tolerating statin medication without side effects at this time LDL is not quite at goal of < 70 on current dose Continue same therapy - will consider increase in lipitor dose - Lipid panel  6. Type II diabetes mellitus with complication (HCC) Clinically stable by exam and report without s/s of hypoglycemia. DM complicated by obesity, htn, lipids. Tolerating medications - intensive insulin/trulicity/januvia/metformin well without side effects or other concerns. - Comprehensive metabolic panel - Hemoglobin A1c - glucose blood (ONE TOUCH ULTRA TEST) test strip; Use 2 times per day to test blood sugar.  Dispense: 200 each; Refill: 3 - OneTouch Delica Lancets 99991111 MISC; 1 each by Does not apply route 2 (two) times daily.  Dispense: 200 each; Refill: 3  7. Morbid obesity ([place]) Continue to work on diet changes  8. Need for immunization against influenza - Flu Vaccine QUAD High Dose(Fluad)   Partially dictated using Editor, commissioning. Any errors are unintentional.   Molly Maidens, MD [facility]  12/29/2018

## 2018-12-30 LAB — CBC WITH DIFFERENTIAL/PLATELET
Basophils Absolute: 0 10*3/uL (ref 0.0–0.2)
Basos: 1 %
EOS (ABSOLUTE): 0.1 10*3/uL (ref 0.0–0.4)
Eos: 2 %
Hematocrit: 42.4 % (ref 34.0–46.6)
Hemoglobin: 13.5 g/dL (ref 11.1–15.9)
Immature Grans (Abs): 0 10*3/uL (ref 0.0–0.1)
Immature Granulocytes: 0 %
Lymphocytes Absolute: 0.8 10*3/uL (ref 0.7–3.1)
Lymphs: 15 %
MCH: 28.4 pg (ref 26.6–33.0)
MCHC: 31.8 g/dL (ref 31.5–35.7)
MCV: 89 fL (ref 79–97)
Monocytes Absolute: 0.4 10*3/uL (ref 0.1–0.9)
Monocytes: 7 %
Neutrophils Absolute: 4.2 10*3/uL (ref 1.4–7.0)
Neutrophils: 75 %
Platelets: 203 10*3/uL (ref 150–450)
RBC: 4.75 x10E6/uL (ref 3.77–5.28)
RDW: 13.4 % (ref 11.7–15.4)
WBC: 5.6 10*3/uL (ref 3.4–10.8)

## 2018-12-30 LAB — COMPREHENSIVE METABOLIC PANEL
ALT: 25 IU/L (ref 0–32)
AST: 19 IU/L (ref 0–40)
Albumin/Globulin Ratio: 2.5 — ABNORMAL HIGH (ref 1.2–2.2)
Albumin: 4.8 g/dL — ABNORMAL HIGH (ref 3.7–4.7)
Alkaline Phosphatase: 72 IU/L (ref 39–117)
BUN/Creatinine Ratio: 21 (ref 12–28)
BUN: 17 mg/dL (ref 8–27)
Bilirubin Total: 0.5 mg/dL (ref 0.0–1.2)
CO2: 21 mmol/L (ref 20–29)
Calcium: 10.3 mg/dL (ref 8.7–10.3)
Chloride: 102 mmol/L (ref 96–106)
Creatinine, Ser: 0.81 mg/dL (ref 0.57–1.00)
GFR calc Af Amer: 83 mL/min/{1.73_m2} (ref >59)
GFR calc non Af Amer: 72 mL/min/{1.73_m2} (ref >59)
Globulin, Total: 1.9 g/dL (ref 1.5–4.5)
Glucose: 198 mg/dL — ABNORMAL HIGH (ref 65–99)
Potassium: 5 mmol/L (ref 3.5–5.2)
Sodium: 140 mmol/L (ref 134–144)
Total Protein: 6.7 g/dL (ref 6.0–8.5)

## 2018-12-30 LAB — LIPID PANEL
Chol/HDL Ratio: 3.2 ratio (ref 0.0–4.4)
Cholesterol, Total: 149 mg/dL (ref 100–199)
HDL: 46 mg/dL (ref >39)
LDL Chol Calc (NIH): 84 mg/dL (ref 0–99)
Triglycerides: 103 mg/dL (ref 0–149)
VLDL Cholesterol Cal: 19 mg/dL (ref 5–40)

## 2018-12-30 LAB — HEMOGLOBIN A1C
Est. average glucose Bld gHb Est-mCnc: 177 mg/dL
Hgb A1c MFr Bld: 7.8 % — ABNORMAL HIGH (ref 4.8–5.6)

## 2018-12-30 LAB — TSH+FREE T4
Free T4: 1.24 ng/dL (ref 0.82–1.77)
TSH: 3.36 u[IU]/mL (ref 0.450–4.500)

## 2019-01-15 ENCOUNTER — Ambulatory Visit: Payer: Self-pay

## 2019-03-02 ENCOUNTER — Other Ambulatory Visit: Payer: Self-pay

## 2019-03-02 ENCOUNTER — Ambulatory Visit (INDEPENDENT_AMBULATORY_CARE_PROVIDER_SITE_OTHER): Payer: Medicare Other | Admitting: Vascular Surgery

## 2019-03-02 ENCOUNTER — Ambulatory Visit (INDEPENDENT_AMBULATORY_CARE_PROVIDER_SITE_OTHER): Payer: Medicare Other

## 2019-03-02 ENCOUNTER — Encounter (INDEPENDENT_AMBULATORY_CARE_PROVIDER_SITE_OTHER): Payer: Self-pay | Admitting: Vascular Surgery

## 2019-03-02 VITALS — BP 174/82 | HR 75 | Resp 19 | Ht 61.0 in | Wt 248.0 lb

## 2019-03-02 DIAGNOSIS — I10 Essential (primary) hypertension: Secondary | ICD-10-CM

## 2019-03-02 DIAGNOSIS — I723 Aneurysm of iliac artery: Secondary | ICD-10-CM

## 2019-03-02 DIAGNOSIS — E118 Type 2 diabetes mellitus with unspecified complications: Secondary | ICD-10-CM

## 2019-03-02 DIAGNOSIS — E785 Hyperlipidemia, unspecified: Secondary | ICD-10-CM

## 2019-03-02 DIAGNOSIS — E1169 Type 2 diabetes mellitus with other specified complication: Secondary | ICD-10-CM

## 2019-03-02 NOTE — Progress Notes (Signed)
MRN : [id number]  Molly Hoover is a 73 y.o. (Nov 30, 1945) female who presents with chief complaint of  Chief Complaint  Patient presents with  . Follow-up  .  History of Present Illness: Patient returns today in follow up of her iliac artery aneurysms.  She is doing well without specific complaints today.  She is in her usual state of health.  She denies aneurysm related symptoms. Specifically, the patient denies new back or abdominal pain, or signs of peripheral embolization.  Her duplex today was limited due to bowel gas and patient intolerance.  The aorta was not well seen.  The right common iliac artery measured 1.2 cm in diameter.  This is slightly smaller than it was last year but again visualization was somewhat poor.  The left common iliac artery remains mildly aneurysmal but unchanged at 1.5 cm in diameter.  Current Outpatient Medications  Medication Sig Dispense Refill  . albuterol (PROVENTIL HFA;VENTOLIN HFA) 108 (90 Base) MCG/ACT inhaler Inhale 2 puffs into the lungs every 6 (six) hours as needed for wheezing or shortness of breath. 1 Inhaler 3  . Albuterol Sulfate (PROAIR RESPICLICK) 123XX123 (90 Base) MCG/ACT AEPB Inhale 1-2 puffs into the lungs every 4 (four) hours as needed. 1 each 2  . alendronate (FOSAMAX) 70 MG tablet TAKE 1 TABLET EVERY WEEK 12 tablet 4  . atorvastatin (LIPITOR) 20 MG tablet TAKE 1 TABLET AT BEDTIME 90 tablet 4  . B-D UF III MINI PEN NEEDLES 31G X 5 MM MISC USE 1 NEEDLE FOUR TIMES A DAY 360 each 4  . Cranberry 500 MG CAPS Take 500 mg by mouth daily.     . cyclobenzaprine (FLEXERIL) 10 MG tablet Take 1 tablet (10 mg total) by mouth 3 (three) times daily as needed for muscle spasms. 40 tablet 0  . diclofenac (VOLTAREN) 75 MG EC tablet     . Dulaglutide (TRULICITY) 1.5 0000000 SOPN Inject 1.5 mg into the skin once a week. 12 pen 3  . gemfibrozil (LOPID) 600 MG tablet TAKE 1 TABLET TWICE A DAY 180 tablet 4  . glucose blood (ONE TOUCH ULTRA TEST) test  strip Use 2 times per day to test blood sugar. 200 each 3  . insulin lispro (HUMALOG KWIKPEN) 100 UNIT/ML KwikPen INJECT 30 UNITS INTO THE SKIN 3 TIMES DAILY WITH MEALS AS DIRECTED 90 mL 5  . JANUVIA 100 MG tablet TAKE 1 TABLET DAILY 90 tablet 4  . LEVEMIR FLEXTOUCH 100 UNIT/ML Pen INJECT 40 UNITS IN THE MORNING AND 30 UNITS IN THE EVENING 30 mL 0  . losartan (COZAAR) 25 MG tablet TAKE 1 TABLET DAILY 90 tablet 4  . metFORMIN (GLUCOPHAGE) 500 MG tablet TAKE 1 TABLET TWICE A DAY 180 tablet 4  . mometasone-formoterol (DULERA) 200-5 MCG/ACT AERO USE 1 INHALATION TWICE A DAY 39 g 3  . omeprazole (PRILOSEC) 20 MG capsule TAKE 1 CAPSULE DAILY 90 capsule 3  . OneTouch Delica Lancets 99991111 MISC 1 each by Does not apply route 2 (two) times daily. 200 each 3  . tiotropium (SPIRIVA HANDIHALER) 18 MCG inhalation capsule Place 1 capsule (18 mcg total) into inhaler and inhale daily. 90 capsule 1  . Vitamin D, Ergocalciferol, (DRISDOL) 1.25 MG (50000 UT) CAPS capsule TAKE 1 CAPSULE TWICE WEEKLY 24 capsule 4   Current Facility-Administered Medications  Medication Dose Route Frequency Provider Last Rate Last Dose  . albuterol (PROVENTIL) (2.5 MG/3ML) 0.083% nebulizer solution 2.5 mg  2.5 mg Nebulization Once Glean Hess, MD  Past Medical History:  Diagnosis Date  . Allergic rhinitis   . Anxiety   . Asthma   . Bilateral breast cysts   . Contusion of left knee 10/06/2015  . COPD (chronic obstructive pulmonary disease) ([place])   . Degenerative disc disease   . Depression   . Diabetes mellitus without complication ([place])   . Diabetic peripheral neuropathy ([place])   . Diverticulosis   . Enlarged heart   . GERD (gastroesophageal reflux disease)   . Hiatal hernia   . Hyperlipidemia   . Hypertension   . Hypothyroidism   . Iliac artery aneurysm, bilateral ([place]) 12/15/2015   1.8 and 1.6 noted on [place] 11/2015 - repeat US showed no aneurysm  . PVD (peripheral vascular disease) ([place])   . Syncope and collapse    . Vitamin D deficiency     Past Surgical History:  Procedure Laterality Date  . BREAST BIOPSY    . CARPAL TUNNEL RELEASE     right  . CERVICAL FUSION  2002  . CHOLECYSTECTOMY    . ESOPHAGOGASTRODUODENOSCOPY  03/2014   benign stricture dilated  . HERNIA REPAIR    . LUMBAR FUSION     L1-4   . 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 TOTAL ABDOMINAL HYSTERECTOMY      Social History        Tobacco Use  . Smoking status: Former Smoker    Packs/day: 2.00    Years: 15.00    Pack years: 30.00    Types: Cigarettes    Last attempt to quit: 1989    Years since quitting: 30.8  . Smokeless tobacco: Never Used  . Tobacco comment: smoking cessation materials not required  Substance Use Topics  . Alcohol use: Yes    Comment: rare  . Drug use: No     Family History Family History  Problem Relation Age of Onset  . Heart attack Mother 46  . Hypertension Mother   . Heart attack Father         Allergies  Allergen Reactions  . Codeine Hives and Itching  . Combivent [Ipratropium-Albuterol] Itching  . Ivp Dye [Iodinated Diagnostic Agents] Hives and Itching  . Meloxicam    REVIEW OF SYSTEMS(Negative unless checked)  Constitutional: [] ?Weight loss[] ?Fever[] ?Chills Cardiac:[] ?Chest pain[] ?Chest pressure[] ?Palpitations [] ?Shortness of breath when laying flat [] ?Shortness of breath at rest [] ?Shortness of breath with exertion. Vascular: [x] ?Pain in legs with walking[x] ?Pain in legsat rest[x] ?Pain in legs when laying flat [] ?Claudication [] ?Pain in feet when walking [] ?Pain in feet at rest [] ?Pain in feet when laying flat [] ?History of DVT [] ?Phlebitis [] ?Swelling in legs [] ?Varicose veins [] ?Non-healing ulcers Pulmonary: [] ?Uses home oxygen [] ?Productive cough[] ?Hemoptysis [] ?Wheeze [] ?COPD [] ?Asthma Neurologic: [] ?Dizziness [] ?Blackouts [] ?Seizures [] ?History of stroke [] ?History of TIA[] ?Aphasia  [] ?Temporary blindness[] ?Dysphagia [] ?Weaknessor numbness in arms [] ?Weakness or numbnessin legs Musculoskeletal: [x] ?Arthritis [] ?Joint swelling [x] ?Joint pain [] ?Low back pain Hematologic:[] ?Easy bruising[] ?Easy bleeding [] ?Hypercoagulable state [] ?Anemic  Gastrointestinal:[] ?Blood in stool[] ?Vomiting blood[] ?Gastroesophageal reflux/heartburn[] ?Abdominal pain Genitourinary: [] ?Chronic kidney disease [] ?Difficulturination [] ?Frequenturination [] ?Burning with urination[] ?Hematuria Skin: [] ?Rashes [] ?Ulcers [] ?Wounds Psychological: [] ?History of anxiety[] ?History of major depression.    Physical Examination  BP (!) 174/82 (BP Location: Right Arm)   Pulse 75   Resp 19   Ht 5\' 1"  (1.549 m)   Wt 248 lb (112.5 kg)   BMI 46.86 kg/m  Gen:  WD/WN, NAD Head: [place]/AT, No temporalis wasting. Ear/Nose/Throat: Hearing grossly intact, nares w/o erythema or drainage Eyes: Conjunctiva clear. Sclera non-icteric Neck: Supple.  Trachea midline Pulmonary:  Good air movement, no use of accessory muscles.  Cardiac: RRR, no JVD Vascular:  Vessel Right Left  Radial Palpable Palpable                                   Gastrointestinal: soft, non-tender/non-distended.  No increased aortic impulse Musculoskeletal: M/S 5/5 throughout.  No deformity or atrophy.  No significant lower extremity edema. Neurologic: Sensation grossly intact in extremities.  Symmetrical.  Speech is fluent.  Psychiatric: Judgment intact, Mood & affect appropriate for pt's clinical situation. Dermatologic: No rashes or ulcers noted.  No cellulitis or open wounds.       Labs Recent Results (from the past 2160 hour(s))  POCT urinalysis dipstick     Status: Normal   Collection Time: 12/29/18  9:56 AM  Result Value Ref Range   Color, UA yellow    Clarity, UA clear    Glucose, UA Negative Negative   Bilirubin, UA neg    Ketones, UA neg    Spec Grav, UA 1.015  1.010 - 1.025   Blood, UA neg    pH, UA 6.5 5.0 - 8.0   Protein, UA Negative Negative   Urobilinogen, UA 0.2 0.2 or 1.0 E.U./dL   Nitrite, UA neg    Leukocytes, UA Negative Negative   Appearance clear    Odor none   CBC with Differential/Platelet     Status: None   Collection Time: 12/29/18 10:02 AM  Result Value Ref Range   WBC 5.6 3.4 - 10.8 x10E3/uL   RBC 4.75 3.77 - 5.28 x10E6/uL   Hemoglobin 13.5 11.1 - 15.9 g/dL   Hematocrit 42.4 34.0 - 46.6 %   MCV 89 79 - 97 fL   MCH 28.4 26.6 - 33.0 pg   MCHC 31.8 31.5 - 35.7 g/dL   RDW 13.4 11.7 - 15.4 %   Platelets 203 150 - 450 x10E3/uL   Neutrophils 75 Not Estab. %   Lymphs 15 Not Estab. %   Monocytes 7 Not Estab. %   Eos 2 Not Estab. %   Basos 1 Not Estab. %   Neutrophils Absolute 4.2 1.4 - 7.0 x10E3/uL   Lymphocytes Absolute 0.8 0.7 - 3.1 x10E3/uL   Monocytes Absolute 0.4 0.1 - 0.9 x10E3/uL   EOS (ABSOLUTE) 0.1 0.0 - 0.4 x10E3/uL   Basophils Absolute 0.0 0.0 - 0.2 x10E3/uL   Immature Granulocytes 0 Not Estab. %   Immature Grans (Abs) 0.0 0.0 - 0.1 x10E3/uL  Comprehensive metabolic panel     Status: Abnormal   Collection Time: 12/29/18 10:02 AM  Result Value Ref Range   Glucose 198 (H) 65 - 99 mg/dL   BUN 17 8 - 27 mg/dL   Creatinine, Ser 0.81 0.57 - 1.00 mg/dL   GFR calc non Af Amer 72 >59 mL/min/1.73   GFR calc Af Amer 83 >59 mL/min/1.73   BUN/Creatinine Ratio 21 12 - 28   Sodium 140 134 - 144 mmol/L   Potassium 5.0 3.5 - 5.2 mmol/L   Chloride 102 96 - 106 mmol/L   CO2 21 20 - 29 mmol/L   Calcium 10.3 8.7 - 10.3 mg/dL   Total Protein 6.7 6.0 - 8.5 g/dL   Albumin 4.8 (H) 3.7 - 4.7 g/dL   Globulin, Total 1.9 1.5 - 4.5 g/dL   Albumin/Globulin Ratio 2.5 (H) 1.2 - 2.2   Bilirubin Total 0.5 0.0 - 1.2 mg/dL   Alkaline Phosphatase 72 39 - 117 IU/L   AST 19 0 -  40 IU/L   ALT 25 0 - 32 IU/L  Hemoglobin A1c     Status: Abnormal   Collection Time: 12/29/18 10:02 AM  Result Value Ref Range   Hgb A1c MFr Bld 7.8 (H) 4.8 -  5.6 %    Comment:          Prediabetes: 5.7 - 6.4          Diabetes: >6.4          Glycemic control for adults with diabetes: <7.0    Est. average glucose Bld gHb Est-mCnc 177 mg/dL  Lipid panel     Status: None   Collection Time: 12/29/18 10:02 AM  Result Value Ref Range   Cholesterol, Total 149 100 - 199 mg/dL   Triglycerides 103 0 - 149 mg/dL   HDL 46 >39 mg/dL   VLDL Cholesterol Cal 19 5 - 40 mg/dL   LDL Chol Calc (NIH) 84 0 - 99 mg/dL   Chol/HDL Ratio 3.2 0.0 - 4.4 ratio    Comment:                                   T. Chol/HDL Ratio                                             Men  Women                               1/2 Avg.Risk  3.4    3.3                                   Avg.Risk  5.0    4.4                                2X Avg.Risk  9.6    7.1                                3X Avg.Risk 23.4   11.0   TSH + free T4     Status: None   Collection Time: 12/29/18 10:02 AM  Result Value Ref Range   TSH 3.360 0.450 - 4.500 uIU/mL   Free T4 1.24 0.82 - 1.77 ng/dL    Radiology No results found.  Assessment/Plan Hyperlipidemia associated with type 2 diabetes mellitus (HCC) blood glucose control important in reducing the progression of atherosclerotic disease and aneurysmal growth. Also, involved in wound healing. On appropriate medications.   Essential hypertension blood pressure control important in reducing the progression of atherosclerotic disease and aneurysmal degeneration. On appropriate oral medications.  Type II diabetes mellitus with ophthalmic manifestations, uncontrolled (HCC) blood glucose control important in reducing the progression of atherosclerotic disease. Also, involved in wound healing. On appropriate medications.  Iliac artery aneurysm (HCC) Her duplex today was limited due to bowel gas and patient intolerance.  The aorta was not well seen.  The right common iliac artery measured 1.2 cm in diameter.  This is slightly smaller than it was last year  but again visualization was somewhat poor.  The left common iliac artery remains mildly aneurysmal but unchanged at 1.5 cm in diameter. No role for intervention at the small size.  Continue to follow on an annual basis with duplex.  Blood pressure control is important for reducing progression of disease.    Leotis Pain, MD  03/02/2019 9:38 AM    This note was created with Dragon medical transcription system.  Any errors from dictation are purely unintentional

## 2019-03-02 NOTE — Assessment & Plan Note (Signed)
Her duplex today was limited due to bowel gas and patient intolerance.  The aorta was not well seen.  The right common iliac artery measured 1.2 cm in diameter.  This is slightly smaller than it was last year but again visualization was somewhat poor.  The left common iliac artery remains mildly aneurysmal but unchanged at 1.5 cm in diameter. No role for intervention at the small size.  Continue to follow on an annual basis with duplex.  Blood pressure control is important for reducing progression of disease.

## 2019-03-14 LAB — HM DIABETES EYE EXAM

## 2019-03-20 ENCOUNTER — Other Ambulatory Visit: Payer: Self-pay | Admitting: Internal Medicine

## 2019-03-20 DIAGNOSIS — IMO0002 Reserved for concepts with insufficient information to code with codable children: Secondary | ICD-10-CM

## 2019-03-20 DIAGNOSIS — E1139 Type 2 diabetes mellitus with other diabetic ophthalmic complication: Secondary | ICD-10-CM

## 2019-04-02 ENCOUNTER — Other Ambulatory Visit: Payer: Self-pay | Admitting: Internal Medicine

## 2019-04-09 ENCOUNTER — Telehealth: Payer: Self-pay

## 2019-04-09 NOTE — Telephone Encounter (Signed)
I would probably change Dulera to Breo 200/25 one inhalation per day.  Novolog insulin will have the same dose and instructions as Humalog.

## 2019-04-09 NOTE — Telephone Encounter (Signed)
Patient called saying their Insurance's prescription plan is changing on 04/27/2019. Her and her husband will no longer be using [facility] but will be switching to [facility].  Her new insurance plan told her they will not cover Dulera inhaler or Humalog. The alternatives given were advair, Symbicort, or Breo & instead of Humalog she will need Novolog sent in.  Please advise on what change we should make for her inhaler. Patient will call me back after christmas to remind me to send the changed medications to her new mail in pharmacy.  Thank you.

## 2019-04-30 ENCOUNTER — Other Ambulatory Visit: Payer: Self-pay

## 2019-04-30 MED ORDER — VITAMIN D (ERGOCALCIFEROL) 1.25 MG (50000 UNIT) PO CAPS: ORAL_CAPSULE | ORAL | 4 refills | Status: DC

## 2019-05-07 ENCOUNTER — Encounter: Payer: Self-pay | Admitting: Internal Medicine

## 2019-05-07 ENCOUNTER — Other Ambulatory Visit: Payer: Self-pay

## 2019-05-07 ENCOUNTER — Ambulatory Visit (INDEPENDENT_AMBULATORY_CARE_PROVIDER_SITE_OTHER): Payer: Medicare Other | Admitting: Internal Medicine

## 2019-05-07 VITALS — BP 142/80 | HR 76 | Ht 62.0 in | Wt 248.0 lb

## 2019-05-07 DIAGNOSIS — I723 Aneurysm of iliac artery: Secondary | ICD-10-CM

## 2019-05-07 DIAGNOSIS — E118 Type 2 diabetes mellitus with unspecified complications: Secondary | ICD-10-CM

## 2019-05-07 DIAGNOSIS — I10 Essential (primary) hypertension: Secondary | ICD-10-CM | POA: Diagnosis not present

## 2019-05-07 DIAGNOSIS — J449 Chronic obstructive pulmonary disease, unspecified: Secondary | ICD-10-CM

## 2019-05-07 MED ORDER — NOVOLOG FLEXPEN 100 UNIT/ML ~~LOC~~ SOPN: 30.0000 [IU] | PEN_INJECTOR | Freq: Three times a day (TID) | SUBCUTANEOUS | 3 refills | Status: DC

## 2019-05-07 MED ORDER — BREO ELLIPTA 200-25 MCG/INH IN AEPB: 1.0000 | INHALATION_SPRAY | Freq: Every day | RESPIRATORY_TRACT | 3 refills | Status: DC

## 2019-05-07 NOTE — Progress Notes (Signed)
Date:  05/07/2019   Name:  Molly Hoover   DOB:  08-01-1945   MRN:  [id number]   Chief Complaint: Diabetes (Follow up.) and Hypertension  Diabetes She presents for her follow-up diabetic visit. She has type 2 diabetes mellitus. Her disease course has been stable. Hypoglycemia symptoms include nervousness/anxiousness. Pertinent negatives for hypoglycemia include no dizziness, headaches or tremors. Pertinent negatives for diabetes include no chest pain, no fatigue and no polyuria. Current diabetic treatment includes intensive insulin program (and Trulicity plus metformin). She is compliant with treatment all of the time. Her weight is stable. She monitors blood glucose at home 1-2 x per day. Her breakfast blood glucose is taken between 6-7 am. Her breakfast blood glucose range is generally 90-110 mg/dl. Her bedtime blood glucose is taken between 9-10 pm. Her bedtime blood glucose range is generally 110-130 mg/dl. An ACE inhibitor/angiotensin II receptor blocker is being taken.  Hypertension This is a chronic problem. The problem is unchanged. The problem is controlled (at home BP 130/85). Pertinent negatives include no chest pain, headaches, palpitations or shortness of breath. Past treatments include angiotensin blockers. The current treatment provides significant improvement.  COPD - she is currently on Dulera but needs to change to [facility]. Her symptoms are stable.  No recent infections.  Lab Results  Component Value Date   CREATININE 0.81 12/29/2018   BUN 17 12/29/2018   NA 140 12/29/2018   K 5.0 12/29/2018   CL 102 12/29/2018   CO2 21 12/29/2018   Lab Results  Component Value Date   CHOL 149 12/29/2018   HDL 46 12/29/2018   LDLCALC 84 12/29/2018   TRIG 103 12/29/2018   CHOLHDL 3.2 12/29/2018   Lab Results  Component Value Date   TSH 3.360 12/29/2018   Lab Results  Component Value Date   HGBA1C 7.8 (H) 12/29/2018     Review of Systems  Constitutional: Negative for  appetite change, fatigue, fever and unexpected weight change.  HENT: Negative for tinnitus and trouble swallowing.   Eyes: Negative for visual disturbance.  Respiratory: Negative for cough, chest tightness and shortness of breath.   Cardiovascular: Negative for chest pain, palpitations and leg swelling.  Gastrointestinal: Negative for abdominal pain.  Endocrine: Negative for polyuria.  Genitourinary: Negative for dysuria and hematuria.  Musculoskeletal: Negative for arthralgias.  Skin: Negative for color change and rash.  Neurological: Negative for dizziness, tremors, numbness and headaches.  Psychiatric/Behavioral: Negative for dysphoric mood. The patient is nervous/anxious.     Patient Active Problem List   Diagnosis Date Noted  . Spinal stenosis of lumbar region 12/31/2016  . Hand pain, right 08/12/2016  . Presence of left artificial knee joint 06/10/2016  . 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 Iliac artery aneurysm ([place]) 12/15/2015  . 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 Osteoarthritis of right knee 12/10/2015  . Arthritis of hand 10/06/2015  . COPD, moderate ([place]) 11/19/2014  . Edema extremities 11/19/2014  . H/O gastrointestinal disease 11/19/2014  . Hyperlipidemia associated with type 2 diabetes mellitus ([place]) 11/19/2014  . Muscle spasms of head and/or neck 11/19/2014  . Idiopathic osteoporosis 11/19/2014  . 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 Venous insufficiency of leg 11/19/2014  . Type II diabetes mellitus with complication ([place]) [id number]  . Morbid obesity ([place]) 10/17/2013  . Essential hypertension 10/17/2013  . Chronic cough 10/17/2013    Allergies  Allergen Reactions  . Codeine Hives and Itching  . Combivent [Ipratropium-Albuterol] Itching  . Ivp  Dye [Iodinated Diagnostic Agents] Hives and Itching  . Meloxicam     Past Surgical History:  Procedure Laterality Date  . BREAST BIOPSY    . CARPAL TUNNEL RELEASE     right  .  CERVICAL FUSION  2002  . CHOLECYSTECTOMY    . ESOPHAGOGASTRODUODENOSCOPY  03/2014   benign stricture dilated  . HERNIA REPAIR    . LUMBAR FUSION     L1-4   . 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 TOTAL ABDOMINAL HYSTERECTOMY      Social History   Tobacco Use  . Smoking status: Former Smoker    Packs/day: 2.00    Years: 15.00    Pack years: 30.00    Types: Cigarettes    Quit date: 1989    Years since quitting: 32.0  . Smokeless tobacco: Never Used  . Tobacco comment: smoking cessation materials not required  Substance Use Topics  . Alcohol use: Yes    Comment: rare  . Drug use: No     Medication list has been reviewed and updated.  Current Meds  Medication Sig  . albuterol (PROVENTIL HFA;VENTOLIN HFA) 108 (90 Base) MCG/ACT inhaler Inhale 2 puffs into the lungs every 6 (six) hours as needed for wheezing or shortness of breath.  . Albuterol Sulfate (PROAIR RESPICLICK) 123XX123 (90 Base) MCG/ACT AEPB Inhale 1-2 puffs into the lungs every 4 (four) hours as needed.  Marland Kitchen alendronate (FOSAMAX) 70 MG tablet TAKE 1 TABLET EVERY WEEK  . atorvastatin (LIPITOR) 20 MG tablet TAKE 1 TABLET AT BEDTIME  . B-D UF III MINI PEN NEEDLES 31G X 5 MM MISC USE 1 NEEDLE FOUR TIMES A DAY  . Cranberry 500 MG CAPS Take 500 mg by mouth daily.   . cyclobenzaprine (FLEXERIL) 10 MG tablet Take 1 tablet (10 mg total) by mouth 3 (three) times daily as needed for muscle spasms.  . diclofenac (VOLTAREN) 75 MG EC tablet   . gemfibrozil (LOPID) 600 MG tablet TAKE 1 TABLET TWICE A DAY  . glucose blood (ONE TOUCH ULTRA TEST) test strip Use 2 times per day to test blood sugar.  . insulin lispro (HUMALOG KWIKPEN) 100 UNIT/ML KwikPen INJECT 30 UNITS INTO THE SKIN 3 TIMES DAILY WITH MEALS AS DIRECTED  . LEVEMIR FLEXTOUCH 100 UNIT/ML Pen INJECT 40 UNITS IN THE MORNING AND 30 UNITS IN THE EVENING  . losartan (COZAAR) 25 MG tablet TAKE 1 TABLET DAILY  . metFORMIN (GLUCOPHAGE) 500 MG tablet TAKE 1 TABLET TWICE A DAY  .  mometasone-formoterol (DULERA) 200-5 MCG/ACT AERO USE 1 INHALATION TWICE A DAY  . omeprazole (PRILOSEC) 20 MG capsule TAKE 1 CAPSULE DAILY  . OneTouch Delica Lancets 99991111 MISC 1 each by Does not apply route 2 (two) times daily.  . TRULICITY 1.5 0000000 SOPN INJECT 1.5 MG UNDER THE SKIN ONCE A WEEK  . Vitamin D, Ergocalciferol, (DRISDOL) 1.25 MG (50000 UT) CAPS capsule TAKE 1 CAPSULE TWICE WEEKLY   Current Facility-Administered Medications for the 05/07/19 encounter (Office Visit) with Glean Hess, MD  Medication  . albuterol (PROVENTIL) (2.5 MG/3ML) 0.083% nebulizer solution 2.5 mg    PHQ 2/9 Scores 05/07/2019 12/29/2018 10/20/2018 05/01/2018  PHQ - 2 Score 4 2 2  0  PHQ- 9 Score 6 5 2  -    BP Readings from Last 3 Encounters:  05/07/19 (!) 142/80  03/02/19 (!) 174/82  12/29/18 136/78    Physical Exam Vitals and nursing note reviewed.  Constitutional:      General: She is not  in acute distress.    Appearance: She is well-developed.  HENT:     Head: Normocephalic and atraumatic.  Cardiovascular:     Rate and Rhythm: Normal rate and regular rhythm.     Pulses: Normal pulses.     Heart sounds: No murmur.  Pulmonary:     Effort: Pulmonary effort is normal. No respiratory distress.  Musculoskeletal:     Cervical back: Normal range of motion and neck supple.     Right lower leg: Edema present.     Left lower leg: Edema present.  Lymphadenopathy:     Cervical: No cervical adenopathy.  Skin:    General: Skin is warm and dry.     Capillary Refill: Capillary refill takes less than 2 seconds.     Findings: No rash.  Neurological:     General: No focal deficit present.     Mental Status: She is alert and oriented to person, place, and time.  Psychiatric:        Behavior: Behavior normal.        Thought Content: Thought content normal.     Wt Readings from Last 3 Encounters:  05/07/19 248 lb (112.5 kg)  03/02/19 248 lb (112.5 kg)  12/29/18 243 lb (110.2 kg)    BP (!)  142/80   Pulse 76   Ht 5\' 2"  (1.575 m)   Wt 248 lb (112.5 kg)   SpO2 98%   BMI 45.36 kg/m   Assessment and Plan: 1. Essential hypertension Clinically stable exam with well controlled BP.   Tolerating medications, losartan, without side effects at this time. Pt to continue current regimen and low sodium diet; benefits of regular exercise as able discussed.  2. Type II diabetes mellitus with complication ([place]) Clinically stable by exam and report without s/s of hypoglycemia. DM complicated by HTN, lipids. Tolerating medications basal bolus insulin plus metformin and Trulicity well without side effects or other concerns. - insulin aspart (NOVOLOG FLEXPEN) 100 UNIT/ML FlexPen; Inject 30 Units into the skin 3 (three) times daily with meals.  Dispense: 30 mL; Refill: 3 - Hemoglobin 123456 - Basic metabolic panel  3. COPD, moderate ([place]) Insurance no longer covers Dulera; will switch to [employer] (pt familiar with use of the device) Reminded to rinse her mouth after use; call if symptoms worsen - fluticasone furoate-vilanterol (BREO ELLIPTA) 200-25 MCG/INH AEPB; Inhale 1 puff into the lungs daily.  Dispense: 3 each; Refill: 3  4. Iliac artery aneurysm ([place]) On [place] 2017 - found to be ectatic without actual aneurysm AAA seen as well - followed by Vascular Surgery  5. Morbid obesity ([place]) Encouraged to continue work on diet Unable to exercise strenuously due to orthopedic issues   Partially dictated using Editor, commissioning. Any errors are unintentional.  Halina Maidens, MD [facility]  05/07/2019

## 2019-05-08 LAB — HEMOGLOBIN A1C
Est. average glucose Bld gHb Est-mCnc: 154 mg/dL
Hgb A1c MFr Bld: 7 % — ABNORMAL HIGH (ref 4.8–5.6)

## 2019-05-08 LAB — BASIC METABOLIC PANEL
BUN/Creatinine Ratio: 26 (ref 12–28)
BUN: 17 mg/dL (ref 8–27)
CO2: 22 mmol/L (ref 20–29)
Calcium: 9.6 mg/dL (ref 8.7–10.3)
Chloride: 103 mmol/L (ref 96–106)
Creatinine, Ser: 0.65 mg/dL (ref 0.57–1.00)
GFR calc Af Amer: 101 mL/min/{1.73_m2} (ref >59)
GFR calc non Af Amer: 88 mL/min/{1.73_m2} (ref >59)
Glucose: 177 mg/dL — ABNORMAL HIGH (ref 65–99)
Potassium: 4.7 mmol/L (ref 3.5–5.2)
Sodium: 140 mmol/L (ref 134–144)

## 2019-05-09 ENCOUNTER — Other Ambulatory Visit: Payer: Self-pay

## 2019-05-09 DIAGNOSIS — E119 Type 2 diabetes mellitus without complications: Secondary | ICD-10-CM

## 2019-05-09 DIAGNOSIS — Z794 Long term (current) use of insulin: Secondary | ICD-10-CM

## 2019-05-09 MED ORDER — BD PEN NEEDLE MINI U/F 31G X 5 MM MISC: 4 refills | Status: DC

## 2019-07-26 ENCOUNTER — Other Ambulatory Visit: Payer: Self-pay

## 2019-07-26 DIAGNOSIS — IMO0002 Reserved for concepts with insufficient information to code with codable children: Secondary | ICD-10-CM

## 2019-07-26 DIAGNOSIS — J449 Chronic obstructive pulmonary disease, unspecified: Secondary | ICD-10-CM

## 2019-07-26 DIAGNOSIS — E1165 Type 2 diabetes mellitus with hyperglycemia: Secondary | ICD-10-CM

## 2019-07-26 DIAGNOSIS — E118 Type 2 diabetes mellitus with unspecified complications: Secondary | ICD-10-CM

## 2019-07-26 DIAGNOSIS — E1139 Type 2 diabetes mellitus with other diabetic ophthalmic complication: Secondary | ICD-10-CM

## 2019-07-26 MED ORDER — OMEPRAZOLE 20 MG PO CPDR: 20.0000 mg | DELAYED_RELEASE_CAPSULE | Freq: Every day | ORAL | 1 refills | Status: DC

## 2019-07-26 MED ORDER — LOSARTAN POTASSIUM 25 MG PO TABS: 25.0000 mg | ORAL_TABLET | Freq: Every day | ORAL | 1 refills | Status: DC

## 2019-07-26 MED ORDER — GEMFIBROZIL 600 MG PO TABS: 600.0000 mg | ORAL_TABLET | Freq: Two times a day (BID) | ORAL | 1 refills | Status: DC

## 2019-07-26 MED ORDER — INSULIN LISPRO (1 UNIT DIAL) 100 UNIT/ML (KWIKPEN): PEN_INJECTOR | SUBCUTANEOUS | 5 refills | Status: DC

## 2019-07-26 MED ORDER — TRULICITY 1.5 MG/0.5ML ~~LOC~~ SOAJ: SUBCUTANEOUS | 1 refills | Status: DC

## 2019-07-26 MED ORDER — BREO ELLIPTA 200-25 MCG/INH IN AEPB: 1.0000 | INHALATION_SPRAY | Freq: Every day | RESPIRATORY_TRACT | 1 refills | Status: DC

## 2019-07-26 MED ORDER — NOVOLOG FLEXPEN 100 UNIT/ML ~~LOC~~ SOPN: 30.0000 [IU] | PEN_INJECTOR | Freq: Three times a day (TID) | SUBCUTANEOUS | 3 refills | Status: DC

## 2019-07-26 MED ORDER — PROAIR RESPICLICK 108 (90 BASE) MCG/ACT IN AEPB: 1.0000 | INHALATION_SPRAY | RESPIRATORY_TRACT | 1 refills | Status: DC | PRN

## 2019-07-26 MED ORDER — ATORVASTATIN CALCIUM 20 MG PO TABS: 20.0000 mg | ORAL_TABLET | Freq: Every day | ORAL | 1 refills | Status: DC

## 2019-07-26 MED ORDER — ALENDRONATE SODIUM 70 MG PO TABS: ORAL_TABLET | ORAL | 4 refills | Status: DC

## 2019-07-26 MED ORDER — METFORMIN HCL 500 MG PO TABS: 500.0000 mg | ORAL_TABLET | Freq: Two times a day (BID) | ORAL | 1 refills | Status: DC

## 2019-08-03 ENCOUNTER — Other Ambulatory Visit: Payer: Self-pay

## 2019-08-03 ENCOUNTER — Other Ambulatory Visit: Payer: Self-pay | Admitting: Internal Medicine

## 2019-08-03 DIAGNOSIS — I10 Essential (primary) hypertension: Secondary | ICD-10-CM

## 2019-08-03 DIAGNOSIS — J449 Chronic obstructive pulmonary disease, unspecified: Secondary | ICD-10-CM

## 2019-08-03 DIAGNOSIS — E119 Type 2 diabetes mellitus without complications: Secondary | ICD-10-CM

## 2019-08-03 DIAGNOSIS — E118 Type 2 diabetes mellitus with unspecified complications: Secondary | ICD-10-CM

## 2019-08-03 DIAGNOSIS — E1165 Type 2 diabetes mellitus with hyperglycemia: Secondary | ICD-10-CM

## 2019-08-03 DIAGNOSIS — IMO0002 Reserved for concepts with insufficient information to code with codable children: Secondary | ICD-10-CM

## 2019-08-03 DIAGNOSIS — E1139 Type 2 diabetes mellitus with other diabetic ophthalmic complication: Secondary | ICD-10-CM

## 2019-08-03 DIAGNOSIS — Z8719 Personal history of other diseases of the digestive system: Secondary | ICD-10-CM

## 2019-08-03 MED ORDER — LEVEMIR FLEXTOUCH 100 UNIT/ML ~~LOC~~ SOPN: PEN_INJECTOR | SUBCUTANEOUS | 0 refills | Status: DC

## 2019-08-03 MED ORDER — ATORVASTATIN CALCIUM 20 MG PO TABS: 20.0000 mg | ORAL_TABLET | Freq: Every day | ORAL | 1 refills | Status: DC

## 2019-08-03 MED ORDER — TRULICITY 1.5 MG/0.5ML ~~LOC~~ SOAJ: SUBCUTANEOUS | 1 refills | Status: DC

## 2019-08-03 MED ORDER — OMEPRAZOLE 20 MG PO CPDR: 20.0000 mg | DELAYED_RELEASE_CAPSULE | Freq: Every day | ORAL | 1 refills | Status: DC

## 2019-08-03 MED ORDER — ALENDRONATE SODIUM 70 MG PO TABS: ORAL_TABLET | ORAL | 4 refills | Status: DC

## 2019-08-03 MED ORDER — GEMFIBROZIL 600 MG PO TABS: 600.0000 mg | ORAL_TABLET | Freq: Two times a day (BID) | ORAL | 1 refills | Status: DC

## 2019-08-03 MED ORDER — METFORMIN HCL 500 MG PO TABS: 500.0000 mg | ORAL_TABLET | Freq: Two times a day (BID) | ORAL | 1 refills | Status: DC

## 2019-08-03 MED ORDER — LOSARTAN POTASSIUM 25 MG PO TABS: 25.0000 mg | ORAL_TABLET | Freq: Every day | ORAL | 1 refills | Status: DC

## 2019-08-03 MED ORDER — BREO ELLIPTA 200-25 MCG/INH IN AEPB: 1.0000 | INHALATION_SPRAY | Freq: Every day | RESPIRATORY_TRACT | 1 refills | Status: DC

## 2019-08-03 MED ORDER — INSULIN LISPRO (1 UNIT DIAL) 100 UNIT/ML (KWIKPEN): PEN_INJECTOR | SUBCUTANEOUS | 5 refills | Status: DC

## 2019-08-03 MED ORDER — ONETOUCH DELICA LANCETS 33G MISC: 1.0000 | Freq: Two times a day (BID) | 3 refills | Status: DC

## 2019-08-06 ENCOUNTER — Other Ambulatory Visit: Payer: Self-pay

## 2019-08-06 ENCOUNTER — Other Ambulatory Visit: Payer: Self-pay | Admitting: Internal Medicine

## 2019-08-06 DIAGNOSIS — E118 Type 2 diabetes mellitus with unspecified complications: Secondary | ICD-10-CM

## 2019-08-06 MED ORDER — GLUCOSE BLOOD VI STRP: ORAL_STRIP | 3 refills | Status: DC

## 2019-08-06 MED ORDER — INSULIN ASPART 100 UNIT/ML FLEXPEN: 30.0000 [IU] | PEN_INJECTOR | Freq: Three times a day (TID) | SUBCUTANEOUS | 3 refills | Status: DC

## 2019-08-27 ENCOUNTER — Encounter: Payer: Self-pay | Admitting: Internal Medicine

## 2019-08-27 ENCOUNTER — Other Ambulatory Visit: Payer: Self-pay

## 2019-08-27 ENCOUNTER — Ambulatory Visit (INDEPENDENT_AMBULATORY_CARE_PROVIDER_SITE_OTHER): Payer: Medicare Other | Admitting: Internal Medicine

## 2019-08-27 VITALS — BP 120/78 | HR 75 | Temp 97.1°F | Ht 62.0 in | Wt 247.0 lb

## 2019-08-27 DIAGNOSIS — E118 Type 2 diabetes mellitus with unspecified complications: Secondary | ICD-10-CM

## 2019-08-27 DIAGNOSIS — I10 Essential (primary) hypertension: Secondary | ICD-10-CM

## 2019-08-27 DIAGNOSIS — I872 Venous insufficiency (chronic) (peripheral): Secondary | ICD-10-CM | POA: Diagnosis not present

## 2019-08-27 MED ORDER — TRULICITY 1.5 MG/0.5ML ~~LOC~~ SOAJ: SUBCUTANEOUS | 1 refills | Status: DC

## 2019-08-27 NOTE — Progress Notes (Signed)
Date:  08/27/2019   Name:  Molly Hoover   DOB:  1946-03-02   MRN:  [id number]   Chief Complaint: Diabetes (4 month follow up.) and Hypertension  Diabetes She presents for her follow-up diabetic visit. She has type 2 diabetes mellitus. Her disease course has been stable. Hypoglycemia symptoms include sweats. Pertinent negatives for hypoglycemia include no headaches or tremors. Pertinent negatives for diabetes include no chest pain, no fatigue, no foot paresthesias, no polydipsia and no polyuria. There are no hypoglycemic complications. Symptoms are stable. Current diabetic treatment includes insulin injections (plus metformin and Trulicity). She is compliant with treatment most of the time (ran out of trulicity one month ago). Her weight is stable. Her breakfast blood glucose is taken between 6-7 am. Her breakfast blood glucose range is generally 140-180 mg/dl. Her dinner blood glucose is taken between 5-6 pm. Her dinner blood glucose range is generally 130-140 mg/dl. An ACE inhibitor/angiotensin II receptor blocker is being taken. Eye exam is current.  Hypertension This is a chronic problem. The problem is controlled. Associated symptoms include sweats. Pertinent negatives include no chest pain, headaches, palpitations or shortness of breath. Past treatments include angiotensin blockers. There are no compliance problems.   LE edema/venous insufficiency - unchanged per patient.  No pain or ulcerations noted.  Not wearing compression hose; not requiring diurectics.  Lab Results  Component Value Date   CREATININE 0.65 05/07/2019   BUN 17 05/07/2019   NA 140 05/07/2019   K 4.7 05/07/2019   CL 103 05/07/2019   CO2 22 05/07/2019   Lab Results  Component Value Date   CHOL 149 12/29/2018   HDL 46 12/29/2018   LDLCALC 84 12/29/2018   TRIG 103 12/29/2018   CHOLHDL 3.2 12/29/2018   Lab Results  Component Value Date   TSH 3.360 12/29/2018   Lab Results  Component Value Date   HGBA1C 7.0 (H) 05/07/2019   Lab Results  Component Value Date   WBC 5.6 12/29/2018   HGB 13.5 12/29/2018   HCT 42.4 12/29/2018   MCV 89 12/29/2018   PLT 203 12/29/2018   Lab Results  Component Value Date   ALT 25 12/29/2018   AST 19 12/29/2018   ALKPHOS 72 12/29/2018   BILITOT 0.5 12/29/2018     Review of Systems  Constitutional: Negative for appetite change, fatigue, fever and unexpected weight change.  HENT: Negative for tinnitus and trouble swallowing.   Eyes: Negative for visual disturbance.  Respiratory: Negative for cough, chest tightness and shortness of breath.   Cardiovascular: Positive for leg swelling. Negative for chest pain and palpitations.  Gastrointestinal: Negative for abdominal pain.  Endocrine: Negative for polydipsia and polyuria.  Genitourinary: Negative for dysuria and hematuria.  Musculoskeletal: Negative for arthralgias.  Neurological: Negative for tremors, numbness and headaches.  Psychiatric/Behavioral: Negative for dysphoric mood.    Patient Active Problem List   Diagnosis Date Noted  . Spinal stenosis of lumbar region 12/31/2016  . Hand pain, right 08/12/2016  . Presence of left artificial knee joint 06/10/2016  . 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 Abdominal aortic ectasia ([place]) 12/11/2015  . Osteoarthritis of right knee 12/10/2015  . Arthritis of hand 10/06/2015  . COPD, moderate ([place]) 11/19/2014  . Edema extremities 11/19/2014  . H/O gastrointestinal disease 11/19/2014  . Hyperlipidemia associated with type 2 diabetes mellitus ([place]) 11/19/2014  . Muscle spasms of head and/or neck 11/19/2014  . Idiopathic osteoporosis 11/19/2014  .  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 Venous insufficiency of leg 11/19/2014  . Type II diabetes mellitus with complication ([place]) [id number]  . Morbid obesity ([place]) 10/17/2013  . Essential hypertension 10/17/2013  . Chronic cough 10/17/2013     Allergies  Allergen Reactions  . Codeine Hives and Itching  . Combivent [Ipratropium-Albuterol] Itching  . Ivp Dye [Iodinated Diagnostic Agents] Hives and Itching  . Meloxicam     Past Surgical History:  Procedure Laterality Date  . BREAST BIOPSY    . CARPAL TUNNEL RELEASE     right  . CERVICAL FUSION  2002  . CHOLECYSTECTOMY    . ESOPHAGOGASTRODUODENOSCOPY  03/2014   benign stricture dilated  . HERNIA REPAIR    . LUMBAR FUSION     L1-4   . 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 TOTAL ABDOMINAL HYSTERECTOMY      Social History   Tobacco Use  . Smoking status: Former Smoker    Packs/day: 2.00    Years: 15.00    Pack years: 30.00    Types: Cigarettes    Quit date: 1989    Years since quitting: 32.3  . Smokeless tobacco: Never Used  . Tobacco comment: smoking cessation materials not required  Substance Use Topics  . Alcohol use: Yes    Comment: rare  . Drug use: No     Medication list has been reviewed and updated.  Current Meds  Medication Sig  . Albuterol Sulfate (PROAIR RESPICLICK) 123XX123 (90 Base) MCG/ACT AEPB Inhale 1-2 puffs into the lungs every 4 (four) hours as needed.  Marland Kitchen alendronate (FOSAMAX) 70 MG tablet TAKE 1 TABLET EVERY WEEK  . atorvastatin (LIPITOR) 20 MG tablet Take 1 tablet (20 mg total) by mouth at bedtime.  . Cranberry 500 MG CAPS Take 500 mg by mouth daily.   . cyclobenzaprine (FLEXERIL) 10 MG tablet Take 1 tablet (10 mg total) by mouth 3 (three) times daily as needed for muscle spasms.  . diclofenac (VOLTAREN) 75 MG EC tablet   . fluticasone furoate-vilanterol (BREO ELLIPTA) 200-25 MCG/INH AEPB Inhale 1 puff into the lungs daily.  Marland Kitchen gemfibrozil (LOPID) 600 MG tablet Take 1 tablet (600 mg total) by mouth 2 (two) times daily.  Marland Kitchen glucose blood (ONE TOUCH ULTRA TEST) test strip Use 2 times per day to test blood sugar.  . insulin aspart (NOVOLOG) 100 UNIT/ML FlexPen Inject 30 Units into the skin 3 (three) times daily with meals.  . insulin detemir  (LEVEMIR FLEXTOUCH) 100 UNIT/ML FlexPen INJECT 40 UNITS IN THE MORNING AND 30 UNITS IN THE EVENING  . Insulin Pen Needle (B-D UF III MINI PEN NEEDLES) 31G X 5 MM MISC USE 1 NEEDLE FOUR TIMES A DAY  . losartan (COZAAR) 25 MG tablet Take 1 tablet (25 mg total) by mouth daily.  . metFORMIN (GLUCOPHAGE) 500 MG tablet Take 1 tablet (500 mg total) by mouth 2 (two) times daily.  Marland Kitchen omeprazole (PRILOSEC) 20 MG capsule Take 1 capsule (20 mg total) by mouth daily.  Glory Rosebush Delica Lancets 99991111 MISC 1 each by Does not apply route 2 (two) times daily.  . Vitamin D, Ergocalciferol, (DRISDOL) 1.25 MG (50000 UT) CAPS capsule TAKE 1 CAPSULE TWICE WEEKLY   Current Facility-Administered Medications for the 08/27/19 encounter (Office Visit) with Glean Hess, MD  Medication  . albuterol (PROVENTIL) (2.5 MG/3ML) 0.083% nebulizer solution 2.5 mg    PHQ 2/9 Scores 08/27/2019 05/07/2019 12/29/2018 10/20/2018  PHQ - 2 Score 0 4 2 2  PHQ- 9 Score 0 6 5 2     BP Readings from Last 3 Encounters:  08/27/19 120/78  05/07/19 (!) 142/80  03/02/19 (!) 174/82    Physical Exam Vitals and nursing note reviewed.  Constitutional:      General: She is not in acute distress.    Appearance: She is well-developed.  HENT:     Head: Normocephalic and atraumatic.  Cardiovascular:     Rate and Rhythm: Normal rate and regular rhythm.     Pulses: Normal pulses.     Heart sounds: No murmur.  Pulmonary:     Effort: Pulmonary effort is normal. No respiratory distress.  Musculoskeletal:     Cervical back: Normal range of motion.     Right lower leg: Edema present.     Left lower leg: Edema (with mild warmth - no ulceration or pain) present.  Lymphadenopathy:     Cervical: No cervical adenopathy.  Skin:    General: Skin is warm and dry.     Capillary Refill: Capillary refill takes less than 2 seconds.     Findings: No rash.  Neurological:     General: No focal deficit present.     Mental Status: She is alert and oriented  to person, place, and time.  Psychiatric:        Behavior: Behavior normal.        Thought Content: Thought content normal.     Wt Readings from Last 3 Encounters:  08/27/19 247 lb (112 kg)  05/07/19 248 lb (112.5 kg)  03/02/19 248 lb (112.5 kg)    BP 120/78   Pulse 75   Temp (!) 97.1 F (36.2 C) (Temporal)   Ht 5\' 2"  (1.575 m)   Wt 247 lb (112 kg)   SpO2 98%   BMI 45.18 kg/m   Assessment and Plan: 1. Essential hypertension Clinically stable exam with well controlled BP on losartan. Tolerating medications without side effects at this time. Pt to continue current regimen and low sodium diet; benefits of regular exercise as able discussed.  2. Type II diabetes mellitus with complication ([place]) Clinically stable by exam and report with mild s/s of hypoglycemia treated with OJ. DM complicated by HTN, lipids, obesity. Tolerating medications metformin, trulicity and insulin well without side effects or other concerns. Sign for eye exam notes Resume Trulicity as soon as it arrives - Hemoglobin 123456 - Basic metabolic panel  3. Venous insufficiency of both lower extremities Stable without pain or ulceration   Partially dictated using Editor, commissioning. Any errors are unintentional.  Halina Maidens, MD [facility]  08/27/2019

## 2019-08-28 LAB — BASIC METABOLIC PANEL
BUN/Creatinine Ratio: 17 (ref 12–28)
BUN: 12 mg/dL (ref 8–27)
CO2: 21 mmol/L (ref 20–29)
Calcium: 9.5 mg/dL (ref 8.7–10.3)
Chloride: 102 mmol/L (ref 96–106)
Creatinine, Ser: 0.72 mg/dL (ref 0.57–1.00)
GFR calc Af Amer: 95 mL/min/{1.73_m2} (ref >59)
GFR calc non Af Amer: 83 mL/min/{1.73_m2} (ref >59)
Glucose: 212 mg/dL — ABNORMAL HIGH (ref 65–99)
Potassium: 5 mmol/L (ref 3.5–5.2)
Sodium: 139 mmol/L (ref 134–144)

## 2019-08-28 LAB — HEMOGLOBIN A1C
Est. average glucose Bld gHb Est-mCnc: 197 mg/dL
Hgb A1c MFr Bld: 8.5 % — ABNORMAL HIGH (ref 4.8–5.6)

## 2019-08-30 ENCOUNTER — Encounter: Payer: Self-pay | Admitting: Internal Medicine

## 2019-09-02 ENCOUNTER — Other Ambulatory Visit: Payer: Self-pay | Admitting: Internal Medicine

## 2019-09-02 DIAGNOSIS — E119 Type 2 diabetes mellitus without complications: Secondary | ICD-10-CM

## 2019-09-02 DIAGNOSIS — Z794 Long term (current) use of insulin: Secondary | ICD-10-CM

## 2019-09-02 NOTE — Telephone Encounter (Signed)
Requested Prescriptions  Pending Prescriptions Disp Refills  . insulin detemir (LEVEMIR FLEXTOUCH) 100 UNIT/ML FlexPen [Pharmacy Med Name: LEVEMIR FLEX PEN 100U/ML] 30 mL 3    Sig: INJECT 40 UNITS            SUBCUTANEOUSLY IN THE      MORNING AND 30 UNITS IN [place]     Endocrinology:  Diabetes - Insulins Failed - 09/02/2019  2:28 AM      Failed - HBA1C is between 0 and 7.9 and within 180 days    Hgb A1c MFr Bld  Date Value Ref Range Status  08/27/2019 8.5 (H) 4.8 - 5.6 % Final    Comment:             Prediabetes: 5.7 - 6.4          Diabetes: >6.4          Glycemic control for adults with diabetes: <7.0          Passed - Valid encounter within last 6 months    Recent Outpatient Visits          6 days ago Essential hypertension   [facility], [place]   3 months ago Essential hypertension   [facility] Glean Hess, MD   8 months ago Annual physical exam   [facility] Glean Hess, MD   10 months ago Essential hypertension   [facility] Glean Hess, MD   1 year ago Type II diabetes mellitus with ophthalmic manifestations, uncontrolled [facility])   [facility] Glean Hess, MD      Future Appointments            In 4 months [place] Melia Jesse Sans, MD [facility], [facility]

## 2019-09-04 ENCOUNTER — Other Ambulatory Visit: Payer: Self-pay | Admitting: Internal Medicine

## 2019-09-04 DIAGNOSIS — E118 Type 2 diabetes mellitus with unspecified complications: Secondary | ICD-10-CM

## 2019-09-04 NOTE — Telephone Encounter (Signed)
Pt states [employer] advised her the dr has not authorized her  Dulaglutide (TRULICITY) 1.5 0000000 SOPN  Pt would like you to resend to [facility]. Or find out if it has been sent. Rs was ordered 5/03.

## 2019-09-05 MED ORDER — TRULICITY 1.5 MG/0.5ML ~~LOC~~ SOAJ: SUBCUTANEOUS | 1 refills | Status: DC

## 2019-09-06 ENCOUNTER — Telehealth: Payer: Self-pay

## 2019-09-06 NOTE — Telephone Encounter (Signed)
Complete PA via FAX for pts Trulicity injection. Received confirmed fax- awaiting outcome from pts insurance.   CM

## 2019-09-10 NOTE — Telephone Encounter (Signed)
PA came back approved from 09/07/2019-09/07/2022.  CM

## 2020-01-02 ENCOUNTER — Ambulatory Visit (INDEPENDENT_AMBULATORY_CARE_PROVIDER_SITE_OTHER): Payer: Medicare Other | Admitting: Internal Medicine

## 2020-01-02 ENCOUNTER — Encounter: Payer: Self-pay | Admitting: Internal Medicine

## 2020-01-02 ENCOUNTER — Other Ambulatory Visit: Payer: Self-pay

## 2020-01-02 VITALS — BP 132/82 | HR 75 | Temp 97.4°F | Ht 62.0 in | Wt 242.0 lb

## 2020-01-02 DIAGNOSIS — Z23 Encounter for immunization: Secondary | ICD-10-CM

## 2020-01-02 DIAGNOSIS — K209 Esophagitis, unspecified without bleeding: Secondary | ICD-10-CM

## 2020-01-02 DIAGNOSIS — Z0000 Encounter for general adult medical examination without abnormal findings: Secondary | ICD-10-CM

## 2020-01-02 DIAGNOSIS — E1169 Type 2 diabetes mellitus with other specified complication: Secondary | ICD-10-CM | POA: Diagnosis not present

## 2020-01-02 DIAGNOSIS — J449 Chronic obstructive pulmonary disease, unspecified: Secondary | ICD-10-CM

## 2020-01-02 DIAGNOSIS — I10 Essential (primary) hypertension: Secondary | ICD-10-CM

## 2020-01-02 DIAGNOSIS — Z1231 Encounter for screening mammogram for malignant neoplasm of breast: Secondary | ICD-10-CM

## 2020-01-02 DIAGNOSIS — E118 Type 2 diabetes mellitus with unspecified complications: Secondary | ICD-10-CM

## 2020-01-02 DIAGNOSIS — E785 Hyperlipidemia, unspecified: Secondary | ICD-10-CM

## 2020-01-02 DIAGNOSIS — M810 Age-related osteoporosis without current pathological fracture: Secondary | ICD-10-CM | POA: Diagnosis not present

## 2020-01-02 LAB — POCT URINALYSIS DIPSTICK
Bilirubin, UA: NEGATIVE
Blood, UA: NEGATIVE
Glucose, UA: NEGATIVE
Ketones, UA: NEGATIVE
Nitrite, UA: NEGATIVE
Protein, UA: NEGATIVE
Spec Grav, UA: 1.02 (ref 1.010–1.025)
Urobilinogen, UA: 0.2 E.U./dL
pH, UA: 6 (ref 5.0–8.0)

## 2020-01-02 MED ORDER — METFORMIN HCL 500 MG PO TABS: 500.0000 mg | ORAL_TABLET | Freq: Two times a day (BID) | ORAL | 1 refills | Status: DC

## 2020-01-02 MED ORDER — GEMFIBROZIL 600 MG PO TABS
600.0000 mg | ORAL_TABLET | Freq: Two times a day (BID) | ORAL | 1 refills | Status: DC
Start: 2020-01-02 — End: 2020-09-01

## 2020-01-02 MED ORDER — LOSARTAN POTASSIUM 25 MG PO TABS: 25.0000 mg | ORAL_TABLET | Freq: Every day | ORAL | 1 refills | Status: DC

## 2020-01-02 MED ORDER — VITAMIN D (ERGOCALCIFEROL) 1.25 MG (50000 UNIT) PO CAPS
ORAL_CAPSULE | ORAL | 4 refills | Status: DC
Start: 2020-01-02 — End: 2020-06-10

## 2020-01-02 MED ORDER — ATORVASTATIN CALCIUM 20 MG PO TABS
20.0000 mg | ORAL_TABLET | Freq: Every day | ORAL | 1 refills | Status: DC
Start: 2020-01-02 — End: 2020-09-01

## 2020-01-02 MED ORDER — OMEPRAZOLE 20 MG PO CPDR: 20.0000 mg | DELAYED_RELEASE_CAPSULE | Freq: Every day | ORAL | 1 refills | Status: DC

## 2020-01-02 NOTE — Progress Notes (Signed)
Date:  01/02/2020   Name:  Molly Hoover   DOB:  1945/08/13   MRN:  [id number]   Chief Complaint: Annual Exam (breast exam no pap/ flu shot)  Molly Hoover is a 74 y.o. female who presents today for her Complete Annual Exam. She feels well. She reports exercising walking x7 days a week (in house). She reports she is sleeping fairly well. Breast complaints none.  Mammogram: 01/2019 [facility] DEXA: remote Pap smear: discontinued Colonoscopy: 04/2011 normal  Immunization History  Administered Date(s) Administered  . Fluad Quad(high Dose 65+) 12/29/2018, 01/02/2020  . Influenza, High Dose Seasonal PF 12/28/2017  . Influenza,inj,Quad PF,6+ Mos 01/02/2015, 12/31/2016  . PFIZER SARS-COV-2 Vaccination 06/15/2019, 07/03/2019  . Pneumococcal Conjugate-13 03/15/2014  . Pneumococcal Polysaccharide-23 04/27/2008, 12/31/2016  . Zoster 04/27/2008    Hypertension This is a chronic problem. The problem is controlled. Pertinent negatives include no chest pain, headaches, palpitations or shortness of breath. Past treatments include angiotensin blockers. The current treatment provides significant improvement.  Diabetes She presents for her follow-up diabetic visit. She has type 2 diabetes mellitus. Her disease course has been improving. Pertinent negatives for hypoglycemia include no dizziness, headaches, nervousness/anxiousness or tremors. Pertinent negatives for diabetes include no chest pain, no fatigue, no polydipsia and no polyuria. Current diabetic treatment includes insulin injections (metformin and resume trulicity in May). She is compliant with treatment most of the time. Her weight is stable. She is following a generally unhealthy diet. An ACE inhibitor/angiotensin II receptor blocker is being taken.  Hyperlipidemia This is a chronic problem. The problem is controlled. Pertinent negatives include no chest pain or shortness of breath. Current antihyperlipidemic treatment  includes statins. The current treatment provides significant improvement of lipids.  COPD - stable mild SOB with exertion.  No recent URI or other chest sx.  Using proair PRN. Osteoporosis - on alendronate and high dose vitamin D. No recent vitamin D level seen.  Pt declines repeat DEXA scan.  Lab Results  Component Value Date   CREATININE 0.72 08/27/2019   BUN 12 08/27/2019   NA 139 08/27/2019   K 5.0 08/27/2019   CL 102 08/27/2019   CO2 21 08/27/2019   Lab Results  Component Value Date   CHOL 149 12/29/2018   HDL 46 12/29/2018   LDLCALC 84 12/29/2018   TRIG 103 12/29/2018   CHOLHDL 3.2 12/29/2018   Lab Results  Component Value Date   TSH 3.360 12/29/2018   Lab Results  Component Value Date   HGBA1C 8.5 (H) 08/27/2019   Lab Results  Component Value Date   WBC 5.6 12/29/2018   HGB 13.5 12/29/2018   HCT 42.4 12/29/2018   MCV 89 12/29/2018   PLT 203 12/29/2018   Lab Results  Component Value Date   ALT 25 12/29/2018   AST 19 12/29/2018   ALKPHOS 72 12/29/2018   BILITOT 0.5 12/29/2018  Last vitamin D No results found for: 25OHVITD2, 25OHVITD3, VD25OH    Review of Systems  Constitutional: Negative for chills, fatigue and fever.  HENT: Negative for congestion, hearing loss, tinnitus, trouble swallowing and voice change.   Eyes: Negative for visual disturbance.  Respiratory: Negative for cough, chest tightness, shortness of breath and wheezing.   Cardiovascular: Negative for chest pain, palpitations and leg swelling.  Gastrointestinal: Negative for abdominal pain, constipation, diarrhea and vomiting.  Endocrine: Negative for polydipsia and polyuria.  Genitourinary: Negative for dysuria, frequency, genital sores, vaginal bleeding and vaginal discharge.  Musculoskeletal: Positive for back  pain (has had surgery for this but it still hurts sometimes). Negative for arthralgias, gait problem and joint swelling.  Skin: Negative for color change and rash.  Neurological:  Negative for dizziness, tremors, light-headedness and headaches.  Hematological: Negative for adenopathy. Does not bruise/bleed easily.  Psychiatric/Behavioral: Negative for dysphoric mood and sleep disturbance. The patient is not nervous/anxious.     Patient Active Problem List   Diagnosis Date Noted  . Spinal stenosis of lumbar region 12/31/2016  . Hand pain, right 08/12/2016  . Presence of left artificial knee joint 06/10/2016  . 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 Osteoarthritis of right knee 12/10/2015  . Arthritis of hand 10/06/2015  . COPD, moderate ([place]) 11/19/2014  . Edema extremities 11/19/2014  . H/O gastrointestinal disease 11/19/2014  . Hyperlipidemia associated with type 2 diabetes mellitus ([place]) 11/19/2014  . Muscle spasms of head and/or neck 11/19/2014  . Idiopathic osteoporosis 11/19/2014  . 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 Venous insufficiency of leg 11/19/2014  . Type II diabetes mellitus with complication ([place]) 42/35/3614  . Morbid obesity ([place]) 10/17/2013  . Essential hypertension 10/17/2013  . Chronic cough 10/17/2013    Allergies  Allergen Reactions  . Codeine Hives and Itching  . Combivent [Ipratropium-Albuterol] Itching  . Ivp Dye [Iodinated Diagnostic Agents] Hives and Itching  . Meloxicam     Past Surgical History:  Procedure Laterality Date  . BREAST BIOPSY    . CARPAL TUNNEL RELEASE     right  . CERVICAL FUSION  2002  . CHOLECYSTECTOMY    . ESOPHAGOGASTRODUODENOSCOPY  03/2014   benign stricture dilated  . HERNIA REPAIR    . LUMBAR FUSION     L1-4   . 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 TOTAL ABDOMINAL HYSTERECTOMY      Social History   Tobacco Use  . Smoking status: Former Smoker    Packs/day: 2.00    Years: 15.00    Pack years: 30.00    Types: Cigarettes    Quit date: 1989    Years since quitting: 32.7  . Smokeless  tobacco: Never Used  . Tobacco comment: smoking cessation materials not required  Vaping Use  . Vaping Use: Never used  Substance Use Topics  . Alcohol use: Yes    Comment: rare  . Drug use: No     Medication list has been reviewed and updated.  Current Meds  Medication Sig  . Albuterol Sulfate (PROAIR RESPICLICK) 431 (90 Base) MCG/ACT AEPB Inhale 1-2 puffs into the lungs every 4 (four) hours as needed.  Marland Kitchen alendronate (FOSAMAX) 70 MG tablet TAKE 1 TABLET EVERY WEEK  . atorvastatin (LIPITOR) 20 MG tablet Take 1 tablet (20 mg total) by mouth at bedtime.  . Cranberry 500 MG CAPS Take 500 mg by mouth daily.   . Dulaglutide (TRULICITY) 1.5 VQ/0.0QQ SOPN INJECT 1.5 MG UNDER THE SKIN ONCE A WEEK  . fluticasone furoate-vilanterol (BREO ELLIPTA) 200-25 MCG/INH AEPB Inhale 1 puff into the lungs daily.  Marland Kitchen gemfibrozil (LOPID) 600 MG tablet Take 1 tablet (600 mg total) by mouth 2 (two) times daily.  Marland Kitchen glucose blood (ONE TOUCH ULTRA TEST) test strip Use 2 times per day to test blood sugar.  . insulin aspart (NOVOLOG) 100 UNIT/ML FlexPen Inject 30 Units into the skin 3 (three) times daily with meals.  . insulin detemir (LEVEMIR FLEXTOUCH) 100 UNIT/ML FlexPen INJECT 40 UNITS  SUBCUTANEOUSLY IN THE      MORNING AND 30 UNITS IN THEEVENING  . Insulin Pen Needle (B-D UF III MINI PEN NEEDLES) 31G X 5 MM MISC USE 1 NEEDLE FOUR TIMES A DAY  . losartan (COZAAR) 25 MG tablet Take 1 tablet (25 mg total) by mouth daily.  . metFORMIN (GLUCOPHAGE) 500 MG tablet Take 1 tablet (500 mg total) by mouth 2 (two) times daily.  Marland Kitchen omeprazole (PRILOSEC) 20 MG capsule Take 1 capsule (20 mg total) by mouth daily.  Glory Rosebush Delica Lancets 78E MISC 1 each by Does not apply route 2 (two) times daily.  . Vitamin D, Ergocalciferol, (DRISDOL) 1.25 MG (50000 UT) CAPS capsule TAKE 1 CAPSULE TWICE WEEKLY  . [DISCONTINUED] cyclobenzaprine (FLEXERIL) 10 MG tablet Take 1 tablet (10 mg total) by mouth 3 (three) times daily as  needed for muscle spasms.  . [DISCONTINUED] diclofenac (VOLTAREN) 75 MG EC tablet    Current Facility-Administered Medications for the 01/02/20 encounter (Office Visit) with Glean Hess, MD  Medication  . albuterol (PROVENTIL) (2.5 MG/3ML) 0.083% nebulizer solution 2.5 mg    PHQ 2/9 Scores 08/27/2019 05/07/2019 12/29/2018 10/20/2018  PHQ - 2 Score 0 4 2 2   PHQ- 9 Score 0 6 5 2     No flowsheet data found.  BP Readings from Last 3 Encounters:  01/02/20 132/82  08/27/19 120/78  05/07/19 (!) 142/80    Physical Exam Vitals and nursing note reviewed.  Constitutional:      General: She is not in acute distress.    Appearance: She is well-developed. She is obese.  HENT:     Head: Normocephalic and atraumatic.     Right Ear: Tympanic membrane and ear canal normal.     Left Ear: Tympanic membrane and ear canal normal.     Nose:     Right Sinus: No maxillary sinus tenderness.     Left Sinus: No maxillary sinus tenderness.  Eyes:     General: No scleral icterus.       Right eye: No discharge.        Left eye: No discharge.  Neck:     Thyroid: No thyromegaly.     Vascular: No carotid bruit.  Cardiovascular:     Rate and Rhythm: Normal rate.     Pulses: Normal pulses.     Heart sounds: Normal heart sounds.  Pulmonary:     Effort: Pulmonary effort is normal. No respiratory distress.     Breath sounds: No wheezing.  Chest:     Breasts:        Right: No mass, nipple discharge, skin change or tenderness.        Left: No mass, nipple discharge, skin change or tenderness.  Abdominal:     General: Bowel sounds are normal.     Palpations: Abdomen is soft.     Tenderness: There is no abdominal tenderness.  Musculoskeletal:     Cervical back: Normal range of motion.     Right lower leg: No edema.     Left lower leg: No edema.  Lymphadenopathy:     Cervical: No cervical adenopathy.  Skin:    General: Skin is warm and dry.     Findings: No rash.  Neurological:     Mental Status:  She is alert and oriented to person, place, and time.     Cranial Nerves: No cranial nerve deficit.     Sensory: No sensory deficit.     Deep Tendon Reflexes: Reflexes  are normal and symmetric.  Psychiatric:        Attention and Perception: Attention normal.        Mood and Affect: Mood normal.     Wt Readings from Last 3 Encounters:  01/02/20 242 lb (109.8 kg)  08/27/19 247 lb (112 kg)  05/07/19 248 lb (112.5 kg)    BP 132/82   Pulse 75   Temp (!) 97.4 F (36.3 C) (Oral)   Ht 5\' 2"  (1.575 m)   Wt 242 lb (109.8 kg)   SpO2 94%   BMI 44.26 kg/m   Assessment and Plan: 1. Annual physical exam Mammogram, colonoscopy and immunizations are up to date - POCT urinalysis dipstick  2. Encounter for screening mammogram for breast cancer Schedule for January - MM 3D SCREEN BREAST BILATERAL; Future  3. Age-related osteoporosis without current pathological fracture Continue vitamin D Recommend DEXA but patient declines - VITAMIN D 25 Hydroxy (Vit-D Deficiency, Fractures)  4. Essential hypertension Clinically stable exam with well controlled BP losartan. Tolerating medications without side effects at this time. Pt to continue current regimen and low sodium diet; benefits of regular exercise as able discussed. - CBC with Differential/Platelet - Comprehensive metabolic panel - TSH - losartan (COZAAR) 25 MG tablet; Take 1 tablet (25 mg total) by mouth daily.  Dispense: 90 tablet; Refill: 1  5. Type II diabetes mellitus with complication (HCC) Clinically stable by exam and report without s/s of hypoglycemia. DM complicated by htn, obesity. Tolerating medications - insulin injections plus metformin and trulicity -  well without side effects or other concerns. - Hemoglobin A1c - metFORMIN (GLUCOPHAGE) 500 MG tablet; Take 1 tablet (500 mg total) by mouth 2 (two) times daily.  Dispense: 180 tablet; Refill: 1  6. Hyperlipidemia associated with type 2 diabetes mellitus ([place]) Tolerating  statin medication without side effects at this time LDL is almost at goal of < 70 on current dose of lipitor 20 mg  - may need to increase to 40 mg Continue same therapy without change at this time. - Lipid panel  7. COPD, moderate (HCC) Continue albuterol PRN  8. Need for immunization against influenza - Flu Vaccine QUAD High Dose(Fluad)  9. Esophagitis determined by endoscopy No dysphagia since esophageal dilation Continue daily PPI - omeprazole (PRILOSEC) 20 MG capsule; Take 1 capsule (20 mg total) by mouth daily.  Dispense: 90 capsule; Refill: 1   Partially dictated using Editor, commissioning. Any errors are unintentional.  Halina Maidens, MD [facility]  01/02/2020

## 2020-01-03 LAB — LIPID PANEL
Chol/HDL Ratio: 3.1 ratio (ref 0.0–4.4)
Cholesterol, Total: 148 mg/dL (ref 100–199)
HDL: 47 mg/dL (ref >39)
LDL Chol Calc (NIH): 82 mg/dL (ref 0–99)
Triglycerides: 104 mg/dL (ref 0–149)
VLDL Cholesterol Cal: 19 mg/dL (ref 5–40)

## 2020-01-03 LAB — CBC WITH DIFFERENTIAL/PLATELET
Basophils Absolute: 0 10*3/uL (ref 0.0–0.2)
Basos: 1 %
EOS (ABSOLUTE): 0.2 10*3/uL (ref 0.0–0.4)
Eos: 3 %
Hematocrit: 40.4 % (ref 34.0–46.6)
Hemoglobin: 13.4 g/dL (ref 11.1–15.9)
Immature Grans (Abs): 0 10*3/uL (ref 0.0–0.1)
Immature Granulocytes: 0 %
Lymphocytes Absolute: 0.8 10*3/uL (ref 0.7–3.1)
Lymphs: 17 %
MCH: 29.3 pg (ref 26.6–33.0)
MCHC: 33.2 g/dL (ref 31.5–35.7)
MCV: 88 fL (ref 79–97)
Monocytes Absolute: 0.4 10*3/uL (ref 0.1–0.9)
Monocytes: 8 %
Neutrophils Absolute: 3.5 10*3/uL (ref 1.4–7.0)
Neutrophils: 71 %
Platelets: 194 10*3/uL (ref 150–450)
RBC: 4.58 x10E6/uL (ref 3.77–5.28)
RDW: 13.9 % (ref 11.7–15.4)
WBC: 5 10*3/uL (ref 3.4–10.8)

## 2020-01-03 LAB — COMPREHENSIVE METABOLIC PANEL
ALT: 20 IU/L (ref 0–32)
AST: 25 IU/L (ref 0–40)
Albumin/Globulin Ratio: 1.8 (ref 1.2–2.2)
Albumin: 4.6 g/dL (ref 3.7–4.7)
Alkaline Phosphatase: 72 IU/L (ref 48–121)
BUN/Creatinine Ratio: 18 (ref 12–28)
BUN: 14 mg/dL (ref 8–27)
Bilirubin Total: 0.6 mg/dL (ref 0.0–1.2)
CO2: 23 mmol/L (ref 20–29)
Calcium: 10 mg/dL (ref 8.7–10.3)
Chloride: 104 mmol/L (ref 96–106)
Creatinine, Ser: 0.77 mg/dL (ref 0.57–1.00)
GFR calc Af Amer: 88 mL/min/{1.73_m2} (ref >59)
GFR calc non Af Amer: 76 mL/min/{1.73_m2} (ref >59)
Globulin, Total: 2.6 g/dL (ref 1.5–4.5)
Glucose: 177 mg/dL — ABNORMAL HIGH (ref 65–99)
Potassium: 5.3 mmol/L — ABNORMAL HIGH (ref 3.5–5.2)
Sodium: 143 mmol/L (ref 134–144)
Total Protein: 7.2 g/dL (ref 6.0–8.5)

## 2020-01-03 LAB — VITAMIN D 25 HYDROXY (VIT D DEFICIENCY, FRACTURES): Vit D, 25-Hydroxy: 70.1 ng/mL (ref 30.0–100.0)

## 2020-01-03 LAB — TSH: TSH: 3.22 u[IU]/mL (ref 0.450–4.500)

## 2020-01-03 LAB — HEMOGLOBIN A1C
Est. average glucose Bld gHb Est-mCnc: 169 mg/dL
Hgb A1c MFr Bld: 7.5 % — ABNORMAL HIGH (ref 4.8–5.6)

## 2020-01-04 ENCOUNTER — Telehealth: Payer: Self-pay

## 2020-01-04 NOTE — Telephone Encounter (Signed)
Called pt to let her know that we received a fax and her insurance company will only pay for a 90 day supply of Omeprazole every 365 days. Told pt she would need to pay for it OTC after the 90 supply. Pt verbalized understanding.  KP

## 2020-01-09 ENCOUNTER — Other Ambulatory Visit: Payer: Self-pay | Admitting: Internal Medicine

## 2020-01-09 DIAGNOSIS — E118 Type 2 diabetes mellitus with unspecified complications: Secondary | ICD-10-CM

## 2020-01-21 ENCOUNTER — Ambulatory Visit: Payer: Medicare Other

## 2020-01-29 ENCOUNTER — Other Ambulatory Visit: Payer: Self-pay | Admitting: Internal Medicine

## 2020-01-29 DIAGNOSIS — J449 Chronic obstructive pulmonary disease, unspecified: Secondary | ICD-10-CM

## 2020-01-29 NOTE — Telephone Encounter (Signed)
Requested Prescriptions  Pending Prescriptions Disp Refills   BREO ELLIPTA 200-25 MCG/INH AEPB [Pharmacy Med Name: BREO ELLIPTA INH 200-25] 3 each 3    Sig: USE 1 INHALATION INTO THE  LUNGS ORALLY DAILY     Pulmonology:  Combination Products Passed - 01/29/2020  2:29 AM      Passed - Valid encounter within last 12 months    Recent Outpatient Visits          3 weeks ago Annual physical exam   [facility] Glean Hess, MD   5 months ago Essential hypertension   [facility] Glean Hess, MD   8 months ago Essential hypertension   [facility] Glean Hess, MD   1 year ago Annual physical exam   [facility] Glean Hess, MD   1 year ago Essential hypertension   [facility] Glean Hess, MD      Future Appointments            In 2 months Army Melia Jesse Sans, MD [facility], [facility]

## 2020-02-12 ENCOUNTER — Other Ambulatory Visit: Payer: Self-pay | Admitting: Internal Medicine

## 2020-02-12 DIAGNOSIS — E118 Type 2 diabetes mellitus with unspecified complications: Secondary | ICD-10-CM

## 2020-02-12 NOTE — Telephone Encounter (Signed)
Requested Prescriptions  Pending Prescriptions Disp Refills   Dulaglutide (TRULICITY) 1.5 PC/3.4KB SOPN [Pharmacy Med Name: TRULICITY(4) PEN 5.2/4.8] 6 mL 1    Sig: INJECT 0.5ML (=1.5 MG)     SUBCUTANEOUSLY ONCE WEEKLY     Endocrinology:  Diabetes - GLP-1 Receptor Agonists Passed - 02/12/2020  2:11 AM      Passed - HBA1C is between 0 and 7.9 and within 180 days    Hgb A1c MFr Bld  Date Value Ref Range Status  01/02/2020 7.5 (H) 4.8 - 5.6 % Final    Comment:             Prediabetes: 5.7 - 6.4          Diabetes: >6.4          Glycemic control for adults with diabetes: <7.0          Passed - Valid encounter within last 6 months    Recent Outpatient Visits          1 month ago Annual physical exam   [facility] Glean Hess, MD   5 months ago Essential hypertension   [place], Laura H, MD   9 months ago Essential hypertension   [facility] Glean Hess, MD   1 year ago Annual physical exam   [facility] Glean Hess, MD   1 year ago Essential hypertension   [facility] Glean Hess, MD      Future Appointments            In 2 months Army Melia Jesse Sans, MD [facility], [facility]

## 2020-03-04 ENCOUNTER — Encounter (INDEPENDENT_AMBULATORY_CARE_PROVIDER_SITE_OTHER): Payer: Self-pay | Admitting: Vascular Surgery

## 2020-03-04 ENCOUNTER — Other Ambulatory Visit: Payer: Self-pay

## 2020-03-04 ENCOUNTER — Ambulatory Visit (INDEPENDENT_AMBULATORY_CARE_PROVIDER_SITE_OTHER): Payer: Medicare Other

## 2020-03-04 ENCOUNTER — Ambulatory Visit (INDEPENDENT_AMBULATORY_CARE_PROVIDER_SITE_OTHER): Payer: Medicare Other | Admitting: Vascular Surgery

## 2020-03-04 VITALS — BP 164/93 | HR 79 | Resp 17 | Ht 62.0 in | Wt 239.0 lb

## 2020-03-04 DIAGNOSIS — I77811 Abdominal aortic ectasia: Secondary | ICD-10-CM | POA: Diagnosis not present

## 2020-03-04 DIAGNOSIS — E785 Hyperlipidemia, unspecified: Secondary | ICD-10-CM

## 2020-03-04 DIAGNOSIS — E118 Type 2 diabetes mellitus with unspecified complications: Secondary | ICD-10-CM | POA: Diagnosis not present

## 2020-03-04 DIAGNOSIS — E1169 Type 2 diabetes mellitus with other specified complication: Secondary | ICD-10-CM

## 2020-03-04 DIAGNOSIS — I10 Essential (primary) hypertension: Secondary | ICD-10-CM | POA: Diagnosis not present

## 2020-03-04 DIAGNOSIS — I723 Aneurysm of iliac artery: Secondary | ICD-10-CM

## 2020-03-04 NOTE — Assessment & Plan Note (Signed)
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  We can continue to follow these on every other year basis.

## 2020-03-04 NOTE — Progress Notes (Signed)
MRN : [id number]  Molly Hoover is a 74 y.o. ([birth year]) female who presents with chief complaint of  Chief Complaint  Patient presents with  . Follow-up    ultrasound  .  History of Present Illness: Patient returns today in follow up of her iliac artery aneurysms.  She is doing well.  She has no specific complaints today.  She denies any aneurysm related symptoms. Specifically, the patient denies new back or abdominal pain, or signs of peripheral embolization. 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  Current Outpatient Medications  Medication Sig Dispense Refill  . Albuterol Sulfate (PROAIR RESPICLICK) 272 (90 Base) MCG/ACT AEPB Inhale 1-2 puffs into the lungs every 4 (four) hours as needed. 3 each 1  . alendronate (FOSAMAX) 70 MG tablet TAKE 1 TABLET EVERY WEEK 12 tablet 4  . atorvastatin (LIPITOR) 20 MG tablet Take 1 tablet (20 mg total) by mouth at bedtime. 90 tablet 1  . BREO ELLIPTA 200-25 MCG/INH AEPB USE 1 INHALATION INTO THE  LUNGS ORALLY DAILY 3 each 3  . Cranberry 500 MG CAPS Take 500 mg by mouth daily.     . Dulaglutide (TRULICITY) 1.5 ZD/6.6YQ SOPN INJECT 0.5ML (=1.5 MG)     SUBCUTANEOUSLY ONCE WEEKLY 6 mL 1  . gemfibrozil (LOPID) 600 MG tablet Take 1 tablet (600 mg total) by mouth 2 (two) times daily. 180 tablet 1  . glucose blood (ONE TOUCH ULTRA TEST) test strip Use 2 times per day to test blood sugar. 200 each 3  . insulin aspart (NOVOLOG) 100 UNIT/ML FlexPen Inject 30 Units into the skin 3 (three) times daily with meals. 90 mL 3  . insulin detemir (LEVEMIR FLEXTOUCH) 100 UNIT/ML FlexPen INJECT 40 UNITS            SUBCUTANEOUSLY IN THE      MORNING AND 30 UNITS IN THEEVENING 30 mL 4  . Insulin Pen Needle (B-D UF III MINI PEN NEEDLES) 31G X 5 MM MISC USE 1 NEEDLE FOUR TIMES A  DAY 360 each 4  . losartan (COZAAR) 25 MG tablet Take 1 tablet (25 mg total) by mouth daily. 90 tablet 1  . metFORMIN (GLUCOPHAGE) 500 MG tablet Take 1 tablet (500 mg total) by mouth 2 (two) times daily. 180 tablet 1  . omeprazole (PRILOSEC) 20 MG capsule Take 1 capsule (20 mg total) by mouth daily. 90 capsule 1  . OneTouch Delica Lancets 03K MISC 1 each by Does not apply route 2 (two) times daily. 200 each 3  . Vitamin D, Ergocalciferol, (DRISDOL) 1.25 MG (50000 UNIT) CAPS capsule TAKE 1 CAPSULE TWICE WEEKLY 24 capsule 4   Current Facility-Administered Medications  Medication Dose Route Frequency Provider Last Rate Last Admin  . albuterol (PROVENTIL) (2.5 MG/3ML) 0.083% nebulizer solution 2.5 mg  2.5 mg Nebulization Once Glean Hess, MD        Past Medical History:  Diagnosis Date  . Allergic rhinitis   . Anxiety   . Asthma   . Bilateral breast cysts   . Contusion of left knee 10/06/2015  . COPD (chronic obstructive pulmonary disease) ([place])   . Degenerative disc disease   . Depression   . Diabetes mellitus without complication ([place])   . Diabetic peripheral neuropathy ([place])   . Diverticulosis   .  Enlarged heart   . GERD (gastroesophageal reflux disease)   . Hiatal hernia   . Hyperlipidemia   . Hypertension   . Hypothyroidism   . Iliac artery aneurysm, bilateral ([place]) 12/15/2015   1.8 and 1.6 noted on [place] 11/2015 - repeat US showed no aneurysm  . PVD (peripheral vascular disease) ([place])   . Syncope and collapse   . Vitamin D deficiency     Past Surgical History:  Procedure Laterality Date  . BREAST BIOPSY    . CARPAL TUNNEL RELEASE     right  . CERVICAL FUSION  2002  . CHOLECYSTECTOMY    . ESOPHAGOGASTRODUODENOSCOPY  03/2014   benign stricture dilated  . HERNIA REPAIR    . LUMBAR FUSION     L1-4   . 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 TOTAL ABDOMINAL HYSTERECTOMY       Social History   Tobacco Use  . Smoking status: Former Smoker    Packs/day: 2.00     Years: 15.00    Pack years: 30.00    Types: Cigarettes    Quit date: 1989    Years since quitting: 32.8  . Smokeless tobacco: Never Used  . Tobacco comment: smoking cessation materials not required  Vaping Use  . Vaping Use: Never used  Substance Use Topics  . Alcohol use: Yes    Comment: rare  . Drug use: No      Family History  Problem Relation Age of Onset  . Heart attack Mother 70  . Hypertension Mother   . Heart attack Father   no bleeding or clotting disorders  Allergies  Allergen Reactions  . Codeine Hives and Itching  . Combivent [Ipratropium-Albuterol] Itching  . Ivp Dye [Iodinated Diagnostic Agents] Hives and Itching  . Meloxicam     REVIEW OF SYSTEMS(Negative unless checked)  Constitutional: []??Weight loss[]??Fever[]??Chills Cardiac:[]??Chest pain[]??Chest pressure[]??Palpitations []??Shortness of breath when laying flat []??Shortness of breath at rest []??Shortness of breath with exertion. Vascular: [x]??Pain in legs with walking[x]??Pain in legsat rest[x]??Pain in legs when laying flat []??Claudication []??Pain in feet when walking []??Pain in feet at rest []??Pain in feet when laying flat []??History of DVT []??Phlebitis []??Swelling in legs []??Varicose veins []??Non-healing ulcers Pulmonary: []??Uses home oxygen []??Productive cough[]??Hemoptysis []??Wheeze []??COPD []??Asthma Neurologic: []??Dizziness []??Blackouts []??Seizures []??History of stroke []??History of TIA[]??Aphasia []??Temporary blindness[]??Dysphagia []??Weaknessor numbness in arms []??Weakness or numbnessin legs Musculoskeletal: [x]??Arthritis []??Joint swelling [x]??Joint pain []??Low back pain Hematologic:[]??Easy bruising[]??Easy bleeding []??Hypercoagulable state []??Anemic  Gastrointestinal:[]??Blood in stool[]??Vomiting blood[]??Gastroesophageal reflux/heartburn[]??Abdominal  pain Genitourinary: []??Chronic kidney disease []??Difficulturination []??Frequenturination []??Burning with urination[]??Hematuria Skin: []??Rashes []??Ulcers []??Wounds Psychological: []??History of anxiety[]??History of major depression.  Physical Examination  BP (!) 164/93 (BP Location: Right Arm)   Pulse 79   Resp 17   Ht 5' 2" (1.575 m)   Wt 239 lb (108.4 kg)   BMI 43.71 kg/m  Gen:  WD/WN, NAD. Obese  Head: [place]/AT, No temporalis wasting. Ear/Nose/Throat: Hearing grossly intact, nares w/o erythema or drainage Eyes: Conjunctiva clear. Sclera non-icteric Neck: Supple.  Trachea midline Pulmonary:  Good air movement, no use of accessory muscles.  Cardiac: RRR, no JVD Vascular:  Vessel Right Left  Radial Palpable Palpable           Gastrointestinal: soft, non-tender/non-distended. No increased aortic impulse Musculoskeletal: M/S 5/5 throughout.  No deformity or atrophy. No edema. Neurologic: Sensation grossly intact in extremities.  Symmetrical.  Speech is fluent.  Psychiatric: Judgment intact, Mood & affect appropriate for pt's clinical situation. Dermatologic: No rashes  or ulcers noted.  No cellulitis or open wounds.       Labs Recent Results (from the past 2160 hour(s))  CBC with Differential/Platelet     Status: None   Collection Time: 01/02/20 10:46 AM  Result Value Ref Range   WBC 5.0 3.4 - 10.8 x10E3/uL   RBC 4.58 3.77 - 5.28 x10E6/uL   Hemoglobin 13.4 11.1 - 15.9 g/dL   Hematocrit 40.4 34.0 - 46.6 %   MCV 88 79 - 97 fL   MCH 29.3 26.6 - 33.0 pg   MCHC 33.2 31 - 35 g/dL   RDW 13.9 11.7 - 15.4 %   Platelets 194 150 - 450 x10E3/uL   Neutrophils 71 Not Estab. %   Lymphs 17 Not Estab. %   Monocytes 8 Not Estab. %   Eos 3 Not Estab. %   Basos 1 Not Estab. %   Neutrophils Absolute 3.5 1.40 - 7.00 x10E3/uL   Lymphocytes Absolute 0.8 0 - 3 x10E3/uL   Monocytes Absolute 0.4 0 - 0 x10E3/uL   EOS (ABSOLUTE) 0.2 0.0 - 0.4 x10E3/uL   Basophils  Absolute 0.0 0 - 0 x10E3/uL   Immature Granulocytes 0 Not Estab. %   Immature Grans (Abs) 0.0 0.0 - 0.1 x10E3/uL  Comprehensive metabolic panel     Status: Abnormal   Collection Time: 01/02/20 10:46 AM  Result Value Ref Range   Glucose 177 (H) 65 - 99 mg/dL   BUN 14 8 - 27 mg/dL   Creatinine, Ser 0.77 0.57 - 1.00 mg/dL   GFR calc non Af Amer 76 >59 mL/min/1.73   GFR calc Af Amer 88 >59 mL/min/1.73    Comment: **[facility] currently reports eGFR in compliance with the current**   recommendations of the [employer]. [facility] will   update reporting as new guidelines are published from the NKF-ASN   Task force.    BUN/Creatinine Ratio 18 12 - 28   Sodium 143 134 - 144 mmol/L   Potassium 5.3 (H) 3.5 - 5.2 mmol/L   Chloride 104 96 - 106 mmol/L   CO2 23 20 - 29 mmol/L   Calcium 10.0 8.7 - 10.3 mg/dL   Total Protein 7.2 6.0 - 8.5 g/dL   Albumin 4.6 3.7 - 4.7 g/dL   Globulin, Total 2.6 1.5 - 4.5 g/dL   Albumin/Globulin Ratio 1.8 1.2 - 2.2   Bilirubin Total 0.6 0.0 - 1.2 mg/dL   Alkaline Phosphatase 72 48 - 121 IU/L    Comment: **Effective January 07, 2020 Alkaline Phosphatase**   reference interval will be changing to:              Age                Female          Female           0 -  5 days         39 - 127       15 - 127           6 - 10 days         46 - 242       40 - 12          11 - 20 days        109 - 357      109 - 357          21 - 30 days  94 - 494       94 - 494           1 -  2 months      149 - 539      149 - 539           3 -  6 months      131 - 452      131 - 452           7 - 11 months      117 - 401      117 - 401   12 months -  6 years       158 - 369      158 - 369           7 - 12 years       150 - 409      150 - 409               13 years       156 - 435       78 - 227               14 years       114 - 375       64 - 161               15 years        88 - 279       56 - 134               16 years        74 - 207       51 - 121                17 years        63 - 161       47 - 113          18 - 20 years        51 - 125       42 - 106              >20 years         44 - 121       44 - 121    AST 25 0 - 40 IU/L   ALT 20 0 - 32 IU/L  Hemoglobin A1c     Status: Abnormal   Collection Time: 01/02/20 10:46 AM  Result Value Ref Range   Hgb A1c MFr Bld 7.5 (H) 4.8 - 5.6 %    Comment:          Prediabetes: 5.7 - 6.4          Diabetes: >6.4          Glycemic control for adults with diabetes: <7.0    Est. average glucose Bld gHb Est-mCnc 169 mg/dL  Lipid panel     Status: None   Collection Time: 01/02/20 10:46 AM  Result Value Ref Range   Cholesterol, Total 148 100 - 199 mg/dL   Triglycerides 104 0 - 149 mg/dL   HDL 47 >39 mg/dL   VLDL Cholesterol Cal 19 5 - 40 mg/dL   LDL Chol Calc (NIH) 82 0 - 99 mg/dL   Chol/HDL Ratio 3.1 0.0 - 4.4 ratio    Comment:  T. Chol/HDL Ratio                                             Men  Women                               1/2 Avg.Risk  3.4    3.3                                   Avg.Risk  5.0    4.4                                2X Avg.Risk  9.6    7.1                                3X Avg.Risk 23.4   11.0   VITAMIN D 25 Hydroxy (Vit-D Deficiency, Fractures)     Status: None   Collection Time: 01/02/20 10:46 AM  Result Value Ref Range   Vit D, 25-Hydroxy 70.1 30.0 - 100.0 ng/mL    Comment: Vitamin D deficiency has been defined by the [place] practice guideline as a level of serum 25-OH vitamin D less than 20 ng/mL (1,2). The Endocrine Society went on to further define vitamin D insufficiency as a level between 21 and 29 ng/mL (2). 1. IOM (Institute of Medicine). 2010. Dietary reference    intakes for calcium and D. [place]: The    [employer]. 2. Holick MF, Binkley , Bischoff-Ferrari HA, et al.    Evaluation, treatment, and prevention of vitamin D    deficiency: an Endocrine Society  clinical practice    guideline. JCEM. 2011 Jul; 96(7):1911-30.   TSH     Status: None   Collection Time: 01/02/20 10:46 AM  Result Value Ref Range   TSH 3.220 0.450 - 4.500 uIU/mL  POCT urinalysis dipstick     Status: Abnormal   Collection Time: 01/02/20 10:54 AM  Result Value Ref Range   Color, UA [place]    Clarity, UA clear    Glucose, UA Negative Negative   Bilirubin, UA neg    Ketones, UA neg    Spec Grav, UA 1.020 1.010 - 1.025   Blood, UA neg    pH, UA 6.0 5.0 - 8.0   Protein, UA Negative Negative   Urobilinogen, UA 0.2 0.2 or 1.0 E.U./dL   Nitrite, UA neg    Leukocytes, UA Trace (A) Negative   Appearance [place],clear    Odor none     Radiology No results found.  Assessment/Plan Hyperlipidemia associated with type 2 diabetes mellitus (HCC) blood glucose control important in reducing the progression of atherosclerotic disease and aneurysmal growth. Also, involved in wound healing. On appropriate medications.   Essential hypertension blood pressure control important in reducing the progression of atherosclerotic diseaseand aneurysmal degeneration. On appropriate oral medications.  Type II diabetes mellitus with ophthalmic manifestations, uncontrolled (HCC) blood glucose control important in reducing the progression of atherosclerotic disease. Also, involved in wound healing. On appropriate medications.  Iliac artery aneurysm (HCC) Duplex today shows stable size of  her iliac arteries bilaterally with maximal diameters of 1.5 cm in both the right and left common iliac arteries.  These would be just into the aneurysmal range and pose her no threat imminently.  We can continue to follow these on every other year basis.    Leotis Pain, MD  03/04/2020 8:47 AM    This note was created with Dragon medical transcription system.  Any errors from dictation are purely unintentional

## 2020-03-13 LAB — HM DIABETES EYE EXAM

## 2020-04-04 ENCOUNTER — Other Ambulatory Visit: Payer: Self-pay | Admitting: Internal Medicine

## 2020-04-04 DIAGNOSIS — E119 Type 2 diabetes mellitus without complications: Secondary | ICD-10-CM

## 2020-04-04 DIAGNOSIS — Z794 Long term (current) use of insulin: Secondary | ICD-10-CM

## 2020-04-11 ENCOUNTER — Telehealth: Payer: Self-pay

## 2020-04-11 NOTE — Telephone Encounter (Signed)
[facility] in [place] to see if patient has had a diabetic Eye exam in the last year. Left VM with there office to ask them to fax the eye exam if so.

## 2020-04-20 ENCOUNTER — Other Ambulatory Visit: Payer: Self-pay | Admitting: Internal Medicine

## 2020-04-20 DIAGNOSIS — E119 Type 2 diabetes mellitus without complications: Secondary | ICD-10-CM

## 2020-04-21 ENCOUNTER — Other Ambulatory Visit: Payer: Self-pay

## 2020-04-21 ENCOUNTER — Encounter: Payer: Self-pay | Admitting: Internal Medicine

## 2020-04-21 ENCOUNTER — Ambulatory Visit (INDEPENDENT_AMBULATORY_CARE_PROVIDER_SITE_OTHER): Payer: Medicare Other | Admitting: Internal Medicine

## 2020-04-21 VITALS — BP 140/76 | HR 73 | Temp 97.9°F | Ht 62.0 in | Wt 248.0 lb

## 2020-04-21 DIAGNOSIS — E118 Type 2 diabetes mellitus with unspecified complications: Secondary | ICD-10-CM | POA: Diagnosis not present

## 2020-04-21 DIAGNOSIS — I10 Essential (primary) hypertension: Secondary | ICD-10-CM | POA: Diagnosis not present

## 2020-04-21 NOTE — Progress Notes (Signed)
Date:  04/21/2020   Name:  Molly Hoover   DOB:  Mar 01, 1946   MRN:  [id number]   Chief Complaint: Diabetes (Last reading 162 this morning )  Diabetes She presents for her follow-up diabetic visit. She has type 2 diabetes mellitus. Her disease course has been stable. Hypoglycemia symptoms include nervousness/anxiousness (when BS less than 80). Pertinent negatives for hypoglycemia include no headaches or tremors. Pertinent negatives for diabetes include no chest pain, no fatigue, no polydipsia and no polyuria. Symptoms are stable. Current diabetic treatment includes insulin injections (Trulicity and metformin). She monitors blood glucose at home 1-2 x per day. There is no change in her home blood glucose trend. Her breakfast blood glucose is taken between 7-8 am. Her breakfast blood glucose range is generally 140-180 mg/dl. An ACE inhibitor/angiotensin II receptor blocker is being taken.    Lab Results  Component Value Date   CREATININE 0.77 01/02/2020   BUN 14 01/02/2020   NA 143 01/02/2020   K 5.3 (H) 01/02/2020   CL 104 01/02/2020   CO2 23 01/02/2020   Lab Results  Component Value Date   CHOL 148 01/02/2020   HDL 47 01/02/2020   LDLCALC 82 01/02/2020   TRIG 104 01/02/2020   CHOLHDL 3.1 01/02/2020   Lab Results  Component Value Date   TSH 3.220 01/02/2020   Lab Results  Component Value Date   HGBA1C 7.5 (H) 01/02/2020   Lab Results  Component Value Date   WBC 5.0 01/02/2020   HGB 13.4 01/02/2020   HCT 40.4 01/02/2020   MCV 88 01/02/2020   PLT 194 01/02/2020   Lab Results  Component Value Date   ALT 20 01/02/2020   AST 25 01/02/2020   ALKPHOS 72 01/02/2020   BILITOT 0.6 01/02/2020     Review of Systems  Constitutional: Negative for appetite change, fatigue, fever and unexpected weight change.  HENT: Negative for tinnitus and trouble swallowing.   Eyes: Negative for visual disturbance.  Respiratory: Negative for cough, chest tightness and shortness  of breath.   Cardiovascular: Negative for chest pain, palpitations and leg swelling.  Gastrointestinal: Negative for abdominal pain.  Endocrine: Negative for polydipsia and polyuria.  Genitourinary: Negative for dysuria and hematuria.  Musculoskeletal: Positive for arthralgias and back pain.  Neurological: Negative for tremors, numbness and headaches.  Psychiatric/Behavioral: Negative for dysphoric mood. The patient is nervous/anxious (when BS less than 80).     Patient Active Problem List   Diagnosis Date Noted  . Iliac artery aneurysm ([place]) 03/04/2020  . Spinal stenosis of lumbar region 12/31/2016  . Hand pain, right 08/12/2016  . Presence of left artificial knee joint 06/10/2016  . 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 Abdominal aortic ectasia ([place]) 12/11/2015  . Osteoarthritis of right knee 12/10/2015  . Arthritis of hand 10/06/2015  . COPD, moderate ([place]) 11/19/2014  . Edema extremities 11/19/2014  . Esophagitis determined by endoscopy 11/19/2014  . Hyperlipidemia associated with type 2 diabetes mellitus ([place]) 11/19/2014  . Muscle spasms of head and/or neck 11/19/2014  . Idiopathic osteoporosis 11/19/2014  . 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 Venous insufficiency of leg 11/19/2014  . Type II diabetes mellitus with complication ([place]) [id number]  . Morbid obesity ([place]) 10/17/2013  . Essential hypertension 10/17/2013  . Chronic cough 10/17/2013    Allergies  Allergen Reactions  . Codeine Hives and Itching  . Combivent [Ipratropium-Albuterol] Itching  . Ivp Dye [Iodinated Diagnostic  Agents] Hives and Itching  . Meloxicam     Past Surgical History:  Procedure Laterality Date  . BREAST BIOPSY    . CARPAL TUNNEL RELEASE     right  . CERVICAL FUSION  2002  . CHOLECYSTECTOMY    . ESOPHAGOGASTRODUODENOSCOPY  03/2014   benign stricture dilated  . HERNIA REPAIR    . LUMBAR FUSION     L1-4   . REPLACEMENT TOTAL KNEE  Left 12/22/208  . TOTAL ABDOMINAL HYSTERECTOMY      Social History   Tobacco Use  . Smoking status: Former Smoker    Packs/day: 2.00    Years: 15.00    Pack years: 30.00    Types: Cigarettes    Quit date: 1989    Years since quitting: 33.0  . Smokeless tobacco: Never Used  . Tobacco comment: smoking cessation materials not required  Vaping Use  . Vaping Use: Never used  Substance Use Topics  . Alcohol use: Yes    Comment: rare  . Drug use: No     Medication list has been reviewed and updated.  Current Meds  Medication Sig  . Albuterol Sulfate (PROAIR RESPICLICK) 123XX123 (90 Base) MCG/ACT AEPB Inhale 1-2 puffs into the lungs every 4 (four) hours as needed.  Marland Kitchen alendronate (FOSAMAX) 70 MG tablet TAKE 1 TABLET EVERY WEEK  . atorvastatin (LIPITOR) 20 MG tablet Take 1 tablet (20 mg total) by mouth at bedtime.  Marland Kitchen BREO ELLIPTA 200-25 MCG/INH AEPB USE 1 INHALATION INTO THE  LUNGS ORALLY DAILY  . Cranberry 500 MG CAPS Take 500 mg by mouth daily.   . Dulaglutide (TRULICITY) 1.5 0000000 SOPN INJECT 0.5ML (=1.5 MG)     SUBCUTANEOUSLY ONCE WEEKLY  . gemfibrozil (LOPID) 600 MG tablet Take 1 tablet (600 mg total) by mouth 2 (two) times daily.  Marland Kitchen glucose blood (ONE TOUCH ULTRA TEST) test strip Use 2 times per day to test blood sugar.  . insulin aspart (NOVOLOG) 100 UNIT/ML FlexPen Inject 30 Units into the skin 3 (three) times daily with meals.  . Insulin Pen Needle (B-D UF III MINI PEN NEEDLES) 31G X 5 MM MISC USE AND DISCARD 1 PEN      NEEDLE 4 TIMES A DAY  . LEVEMIR FLEXTOUCH 100 UNIT/ML FlexPen INJECT 40 UNITS            SUBCUTANEOUSLY IN THE      MORNING AND 30 UNITS IN THEEVENING  . losartan (COZAAR) 25 MG tablet Take 1 tablet (25 mg total) by mouth daily.  . metFORMIN (GLUCOPHAGE) 500 MG tablet Take 1 tablet (500 mg total) by mouth 2 (two) times daily.  Marland Kitchen omeprazole (PRILOSEC) 20 MG capsule Take 1 capsule (20 mg total) by mouth daily.  Glory Rosebush Delica Lancets [id number] MISC 1 each by Does  not apply route 2 (two) times daily.  . Vitamin D, Ergocalciferol, (DRISDOL) 1.25 MG (50000 UNIT) CAPS capsule TAKE 1 CAPSULE TWICE WEEKLY   Current Facility-Administered Medications for the 04/21/20 encounter (Office Visit) with Glean Hess, MD  Medication  . albuterol (PROVENTIL) (2.5 MG/3ML) 0.083% nebulizer solution 2.5 mg    PHQ 2/9 Scores 04/21/2020 08/27/2019 05/07/2019 12/29/2018  PHQ - 2 Score 0 0 4 2  PHQ- 9 Score 0 0 6 5    GAD 7 : Generalized Anxiety Score 04/21/2020  Nervous, Anxious, on Edge 0  Control/stop worrying 1  Worry too much - different things 1  Trouble relaxing 0  Restless 0  Easily annoyed or  irritable 0  Afraid - awful might happen 0  Total GAD 7 Score 2    BP Readings from Last 3 Encounters:  04/21/20 140/76  03/04/20 (!) 164/93  01/02/20 132/82    Physical Exam Vitals and nursing note reviewed.  Constitutional:      General: She is not in acute distress.    Appearance: She is well-developed.  HENT:     Head: Normocephalic and atraumatic.  Cardiovascular:     Rate and Rhythm: Normal rate and regular rhythm.     Pulses: Normal pulses.  Pulmonary:     Effort: Pulmonary effort is normal. No respiratory distress.     Breath sounds: No wheezing or rhonchi.  Musculoskeletal:     Cervical back: Normal range of motion.     Right lower leg: Edema present.     Left lower leg: Edema present.  Lymphadenopathy:     Cervical: No cervical adenopathy.  Skin:    General: Skin is warm and dry.     Capillary Refill: Capillary refill takes less than 2 seconds.     Findings: No rash.  Neurological:     General: No focal deficit present.     Mental Status: She is alert and oriented to person, place, and time.  Psychiatric:        Mood and Affect: Mood and affect and mood normal.        Behavior: Behavior normal.     Wt Readings from Last 3 Encounters:  04/21/20 248 lb (112.5 kg)  03/04/20 239 lb (108.4 kg)  01/02/20 242 lb (109.8 kg)    BP  140/76   Pulse 73   Temp 97.9 F (36.6 C) (Oral)   Ht 5\' 2"  (1.575 m)   Wt 248 lb (112.5 kg)   SpO2 97%   BMI 45.36 kg/m   Assessment and Plan: 1. Type II diabetes mellitus with complication (HCC) Clinically stable by exam and report without s/s of hypoglycemia. DM complicated by HTN, obesity. Tolerating medications well without side effects or other concerns.  Trulicity and Levemir are fairly costly - she is hoping for a better price with her new insurance - Hemoglobin A1c  2. Essential hypertension Clinically stable exam with well controlled BP on losartan. Tolerating medications without side effects at this time. Pt to continue current regimen and low sodium diet; benefits of regular exercise as able discussed.   Partially dictated using . Any errors are unintentional.  Animal nutritionist, MD [facility]  04/21/2020

## 2020-04-22 LAB — HEMOGLOBIN A1C
Est. average glucose Bld gHb Est-mCnc: 151 mg/dL
Hgb A1c MFr Bld: 6.9 % — ABNORMAL HIGH (ref 4.8–5.6)

## 2020-05-29 ENCOUNTER — Telehealth: Payer: Self-pay | Admitting: Internal Medicine

## 2020-05-29 NOTE — Telephone Encounter (Signed)
Spoke to pt went over her last labs with her. Printed labs and immunizations to be mailed to pt per pts request. Pt verbalized understanding.  KP

## 2020-05-29 NOTE — Telephone Encounter (Signed)
Patient is calling to speak to [place]. Patient would like to know what her cholesterol readings where at the time of her last lab work.CB- [phone]

## 2020-06-10 ENCOUNTER — Other Ambulatory Visit: Payer: Self-pay | Admitting: Internal Medicine

## 2020-06-10 DIAGNOSIS — K209 Esophagitis, unspecified without bleeding: Secondary | ICD-10-CM

## 2020-06-10 MED ORDER — OMEPRAZOLE 20 MG PO CPDR: 20.0000 mg | DELAYED_RELEASE_CAPSULE | Freq: Every day | ORAL | 1 refills | Status: DC

## 2020-06-10 MED ORDER — VITAMIN D (ERGOCALCIFEROL) 1.25 MG (50000 UNIT) PO CAPS: ORAL_CAPSULE | ORAL | 4 refills | Status: DC

## 2020-06-10 NOTE — Telephone Encounter (Signed)
Medication Refill - omeprazole (PRILOSEC) 20 MG capsule and Vitamin D, Ergocalciferol, (DRISDOL) 1.25 MG (50000 UNIT) CAPS capsule   Has the patient contacted their pharmacy? No.  (Agent: If no, request that the patient contact the pharmacy for the refill.) [facility] [employer] (with phone number or street name):[address], [place], [place] [address] [phone]  Agent: Please be advised that RX refills may take up to 3 business days. We ask that you follow-up with your pharmacy.

## 2020-06-10 NOTE — Telephone Encounter (Signed)
Notes to clinic:  Patient requesting to have sent to new pharmacy    Requested Prescriptions  Pending Prescriptions Disp Refills   Vitamin D, Ergocalciferol, (DRISDOL) 1.25 MG (50000 UNIT) CAPS capsule 24 capsule 4    Sig: TAKE [place] WEEKLY      Endocrinology:  Vitamins - Vitamin D Supplementation Failed - 06/10/2020 10:07 AM      Failed - 50,000 IU strengths are not delegated      Failed - Phosphate in normal range and within 360 days    Phosphorus  Date Value Ref Range Status  03/01/2016 3.7 2.5 - 4.5 mg/dL Final          Passed - Ca in normal range and within 360 days    Calcium  Date Value Ref Range Status  01/02/2020 10.0 8.7 - 10.3 mg/dL Final          Passed - Vitamin D in normal range and within 360 days    Vit D, 25-Hydroxy  Date Value Ref Range Status  01/02/2020 70.1 30.0 - 100.0 ng/mL Final    Comment:    Vitamin D deficiency has been defined by the Institute of Medicine and an Endocrine Society practice guideline as a level of serum 25-OH vitamin D less than 20 ng/mL (1,2). The Endocrine Society went on to further define vitamin D insufficiency as a level between 21 and 29 ng/mL (2). 1. IOM (Institute of Medicine). 2010. Dietary reference    intakes for calcium and D. [place]: The    [employer]. 2. Holick MF, [place], Bischoff-Ferrari HA, et al.    Evaluation, treatment, and prevention of vitamin D    deficiency: an Endocrine Society clinical practice    guideline. JCEM. 2011 Jul; 96(7):1911-30.           Passed - Valid encounter within last 12 months    Recent Outpatient Visits           1 month ago Type II diabetes mellitus with complication [facility])   [facility] Glean Hess, MD   5 months ago Annual physical exam   [facility] Glean Hess, MD   9 months ago Essential hypertension   [facility] Glean Hess, MD   1 year ago Essential hypertension   [facility] Glean Hess, MD   1 year ago Annual physical exam   [facility] Glean Hess, MD       Future Appointments             In 2 months Glean Hess, MD [facility]              Signed Prescriptions Disp Refills   omeprazole (PRILOSEC) 20 MG capsule 90 capsule 1    Sig: Take 1 capsule (20 mg total) by mouth daily.      Gastroenterology: Proton Pump Inhibitors Passed - 06/10/2020 10:07 AM      Passed - Valid encounter within last 12 months    Recent Outpatient Visits           1 month ago Type II diabetes mellitus with complication [facility])   [facility] Glean Hess, MD   5 months ago Annual physical exam   [facility] Glean Hess, MD   9 months ago Essential hypertension   [facility] Glean Hess, MD   1 year ago Essential hypertension   [facility] Glean Hess, MD   1 year ago Annual physical exam   [facility] Glean Hess, MD       Future Appointments             In 2 months Army Melia Jesse Sans, MD [facility], [facility]

## 2020-08-11 DIAGNOSIS — S46012A Strain of muscle(s) and tendon(s) of the rotator cuff of left shoulder, initial encounter: Secondary | ICD-10-CM | POA: Diagnosis not present

## 2020-08-11 DIAGNOSIS — R0781 Pleurodynia: Secondary | ICD-10-CM | POA: Diagnosis not present

## 2020-08-13 ENCOUNTER — Ambulatory Visit: Payer: Medicare Other | Admitting: Internal Medicine

## 2020-08-31 NOTE — Progress Notes (Signed)
Date:  09/01/2020   Name:  Molly Hoover   DOB:  May 16, 1945   MRN:  [id number]   Chief Complaint: Diabetes (155 BS this morning )  Diabetes She presents for her follow-up diabetic visit. She has type 2 diabetes mellitus. Her disease course has been stable. Pertinent negatives for hypoglycemia include no dizziness or headaches. Pertinent negatives for diabetes include no chest pain, no fatigue and no weakness. Current diabetic treatment includes oral agent (dual therapy) and insulin injections (metformin, trulicity, basal-bolus insulin). An ACE inhibitor/angiotensin II receptor blocker is being taken.  Hypertension This is a chronic problem. The problem is controlled. Pertinent negatives include no chest pain, headaches, palpitations or shortness of breath. Past treatments include angiotensin blockers.  Fall - she fell last month onto her left side.  Bruised her ribs and shoulder.  She is improving but noticed that her ankles are swelling more since then.  She did go to UC and have xrays just after the fall.  Lab Results  Component Value Date   CREATININE 0.77 01/02/2020   BUN 14 01/02/2020   NA 143 01/02/2020   K 5.3 (H) 01/02/2020   CL 104 01/02/2020   CO2 23 01/02/2020   Lab Results  Component Value Date   CHOL 148 01/02/2020   HDL 47 01/02/2020   LDLCALC 82 01/02/2020   TRIG 104 01/02/2020   CHOLHDL 3.1 01/02/2020   Lab Results  Component Value Date   TSH 3.220 01/02/2020   Lab Results  Component Value Date   HGBA1C 6.5 (A) 09/01/2020   Lab Results  Component Value Date   WBC 5.0 01/02/2020   HGB 13.4 01/02/2020   HCT 40.4 01/02/2020   MCV 88 01/02/2020   PLT 194 01/02/2020   Lab Results  Component Value Date   ALT 20 01/02/2020   AST 25 01/02/2020   ALKPHOS 72 01/02/2020   BILITOT 0.6 01/02/2020     Review of Systems  Constitutional: Negative for fatigue and unexpected weight change.  HENT: Negative for nosebleeds.   Eyes: Negative for visual  disturbance.  Respiratory: Negative for cough, chest tightness, shortness of breath and wheezing.   Cardiovascular: Positive for leg swelling. Negative for chest pain and palpitations.  Gastrointestinal: Negative for abdominal pain, constipation and diarrhea.  Musculoskeletal: Positive for arthralgias (sore ribs on left and left shoulder) and gait problem. Negative for joint swelling.  Neurological: Negative for dizziness, weakness, light-headedness and headaches.    Patient Active Problem List   Diagnosis Date Noted  . Iliac artery aneurysm ([place]) 03/04/2020  . Spinal stenosis of lumbar region 12/31/2016  . Presence of left artificial knee joint 06/10/2016  . 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 Abdominal aortic ectasia ([place]) 12/11/2015  . Osteoarthritis of right knee 12/10/2015  . Arthritis of hand 10/06/2015  . COPD, moderate ([place]) 11/19/2014  . Edema extremities 11/19/2014  . Esophagitis determined by endoscopy 11/19/2014  . Hyperlipidemia associated with type 2 diabetes mellitus ([place]) 11/19/2014  . Idiopathic osteoporosis 11/19/2014  . 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 Venous insufficiency of leg 11/19/2014  . Type II diabetes mellitus with complication ([place]) 51/76/1607  . Morbid obesity ([place]) 10/17/2013  . Essential hypertension 10/17/2013  . Chronic cough 10/17/2013    Allergies  Allergen Reactions  . Codeine Hives and Itching  . Combivent [Ipratropium-Albuterol] Itching  . Ivp Dye [Iodinated Diagnostic Agents] Hives and Itching  . Meloxicam     Past Surgical History:  Procedure Laterality  Date  . BREAST BIOPSY    . CARPAL TUNNEL RELEASE     right  . CERVICAL FUSION  2002  . CHOLECYSTECTOMY    . ESOPHAGOGASTRODUODENOSCOPY  03/2014   benign stricture dilated  . HERNIA REPAIR    . LUMBAR FUSION     L1-4   . 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 TOTAL ABDOMINAL HYSTERECTOMY      Social History   Tobacco Use  . Smoking status: Former  Smoker    Packs/day: 2.00    Years: 15.00    Pack years: 30.00    Types: Cigarettes    Quit date: 1989    Years since quitting: 33.3  . Smokeless tobacco: Never Used  . Tobacco comment: smoking cessation materials not required  Vaping Use  . Vaping Use: Never used  Substance Use Topics  . Alcohol use: Yes    Comment: rare  . Drug use: No     Medication list has been reviewed and updated.  Current Meds  Medication Sig  . Cranberry 500 MG CAPS Take 500 mg by mouth daily.   Marland Kitchen omeprazole (PRILOSEC) 20 MG capsule Take 1 capsule (20 mg total) by mouth daily.  Marland Kitchen triamterene-hydrochlorothiazide (MAXZIDE-25) 37.5-25 MG tablet Take 1 tablet by mouth daily.  . Vitamin D, Ergocalciferol, (DRISDOL) 1.25 MG (50000 UNIT) CAPS capsule TAKE 1 CAPSULE TWICE WEEKLY  . [DISCONTINUED] alendronate (FOSAMAX) 70 MG tablet TAKE 1 TABLET EVERY WEEK  . [DISCONTINUED] atorvastatin (LIPITOR) 20 MG tablet Take 1 tablet (20 mg total) by mouth at bedtime.  . [DISCONTINUED] BREO ELLIPTA 200-25 MCG/INH AEPB USE 1 INHALATION INTO THE  LUNGS ORALLY DAILY  . [DISCONTINUED] Dulaglutide (TRULICITY) 1.5 FY/1.0FB SOPN INJECT 0.5ML (=1.5 MG)     SUBCUTANEOUSLY ONCE WEEKLY  . [DISCONTINUED] gemfibrozil (LOPID) 600 MG tablet Take 1 tablet (600 mg total) by mouth 2 (two) times daily.  . [DISCONTINUED] glucose blood (ONE TOUCH ULTRA TEST) test strip Use 2 times per day to test blood sugar.  . [DISCONTINUED] insulin aspart (NOVOLOG) 100 UNIT/ML FlexPen Inject 30 Units into the skin 3 (three) times daily with meals.  . [DISCONTINUED] Insulin Pen Needle (B-D UF III MINI PEN NEEDLES) 31G X 5 MM MISC USE AND DISCARD 1 PEN      NEEDLE 4 TIMES A DAY  . [DISCONTINUED] LEVEMIR FLEXTOUCH 100 UNIT/ML FlexPen INJECT 40 UNITS            SUBCUTANEOUSLY IN THE      MORNING AND 30 UNITS IN THEEVENING  . [DISCONTINUED] losartan (COZAAR) 25 MG tablet Take 1 tablet (25 mg total) by mouth daily.  . [DISCONTINUED] metFORMIN (GLUCOPHAGE) 500 MG  tablet Take 1 tablet (500 mg total) by mouth 2 (two) times daily.  . [DISCONTINUED] OneTouch Delica Lancets 51W MISC 1 each by Does not apply route 2 (two) times daily.   Current Facility-Administered Medications for the 09/01/20 encounter (Office Visit) with Glean Hess, MD  Medication  . albuterol (PROVENTIL) (2.5 MG/3ML) 0.083% nebulizer solution 2.5 mg    PHQ 2/9 Scores 09/01/2020 04/21/2020 08/27/2019 05/07/2019  PHQ - 2 Score 0 0 0 4  PHQ- 9 Score 0 0 0 6    GAD 7 : Generalized Anxiety Score 09/01/2020 04/21/2020  Nervous, Anxious, on Edge 0 0  Control/stop worrying 0 1  Worry too much - different things 0 1  Trouble relaxing 0 0  Restless 0 0  Easily annoyed or irritable 0 0  Afraid - awful might happen  0 0  Total GAD 7 Score 0 2    BP Readings from Last 3 Encounters:  09/01/20 136/82  04/21/20 140/76  03/04/20 (!) 164/93    Physical Exam Vitals and nursing note reviewed.  Constitutional:      General: She is not in acute distress.    Appearance: She is well-developed.  HENT:     Head: Normocephalic and atraumatic.  Neck:     Vascular: No carotid bruit.  Cardiovascular:     Rate and Rhythm: Normal rate and regular rhythm.     Pulses: Normal pulses.     Heart sounds: No murmur heard.   Pulmonary:     Effort: Pulmonary effort is normal. No respiratory distress.     Breath sounds: No wheezing or rhonchi.  Musculoskeletal:     Cervical back: Normal range of motion.     Right lower leg: Edema present.     Left lower leg: Edema present.  Lymphadenopathy:     Cervical: No cervical adenopathy.  Skin:    General: Skin is warm and dry.     Capillary Refill: Capillary refill takes less than 2 seconds.     Findings: No rash.  Neurological:     Mental Status: She is alert and oriented to person, place, and time.  Psychiatric:        Mood and Affect: Mood normal.        Behavior: Behavior normal.     Wt Readings from Last 3 Encounters:  09/01/20 255 lb (115.7  kg)  04/21/20 248 lb (112.5 kg)  03/04/20 239 lb (108.4 kg)    BP 136/82   Pulse 81   Temp 97.7 F (36.5 C) (Oral)   Ht 5\' 2"  (1.575 m)   Wt 255 lb (115.7 kg)   SpO2 95%   BMI 46.64 kg/m   Assessment and Plan: 1. Type II diabetes mellitus with complication (HCC) Clinically stable by exam and report without s/s of hypoglycemia. DM complicated by HTN. Tolerating medications well without side effects or other concerns. - POCT HgB A1C = 6.5 today - glucose blood (ONE TOUCH ULTRA TEST) test strip; Use 2 times per day to test blood sugar.  Dispense: 200 each; Refill: 3 - insulin aspart (NOVOLOG) 100 UNIT/ML FlexPen; Inject 30 Units into the skin 3 (three) times daily with meals.  Dispense: 90 mL; Refill: 3 - insulin detemir (LEVEMIR FLEXTOUCH) 100 UNIT/ML FlexPen; 40 u AM and 30 u PM  Dispense: 30 mL; Refill: 4 - metFORMIN (GLUCOPHAGE) 500 MG tablet; Take 1 tablet (500 mg total) by mouth 2 (two) times daily.  Dispense: 180 tablet; Refill: 1 - OneTouch Delica Lancets 99991111 MISC; 1 each by Does not apply route 2 (two) times daily.  Dispense: 200 each; Refill: 3 - Dulaglutide (TRULICITY) 1.5 0000000 SOPN; INJECT 0.5ML (=1.5 MG)     SUBCUTANEOUSLY ONCE WEEKLY  Dispense: 6 mL; Refill: 1 - Insulin Pen Needle (B-D UF III MINI PEN NEEDLES) 31G X 5 MM MISC; USE AND DISCARD 1 PEN      NEEDLE 4 TIMES A DAY  Dispense: 360 each; Refill: 3  2. Essential hypertension Clinically stable exam with fairly well controlled BP on losartan. Will add diuretic to help with edema and improve BP. Tolerating medications without side effects at this time. Pt to continue current regimen and low sodium diet; benefits of regular exercise as able discussed. - atorvastatin (LIPITOR) 20 MG tablet; Take 1 tablet (20 mg total) by mouth at bedtime.  Dispense: 90 tablet; Refill: 1 - gemfibrozil (LOPID) 600 MG tablet; Take 1 tablet (600 mg total) by mouth 2 (two) times daily.  Dispense: 180 tablet; Refill: 1 - losartan  (COZAAR) 25 MG tablet; Take 1 tablet (25 mg total) by mouth daily.  Dispense: 90 tablet; Refill: 1 - triamterene-hydrochlorothiazide (MAXZIDE-25) 37.5-25 MG tablet; Take 1 tablet by mouth daily.  Dispense: 90 tablet; Refill: 3  3. COPD, moderate (HCC) - fluticasone furoate-vilanterol (BREO ELLIPTA) 200-25 MCG/INH AEPB; USE 1 INHALATION INTO THE  LUNGS ORALLY DAILY  Dispense: 3 each; Refill: 3  4. Hyperlipidemia associated with type 2 diabetes mellitus ([place]) On statin and gemfibrozil.  5. Iliac artery aneurysm (HCC) stable  6. Morbid obesity ([place]) Continue dietary efforts for weight loss   Partially dictated using Editor, commissioning. Any errors are unintentional.  Halina Maidens, MD [facility]  09/01/2020

## 2020-09-01 ENCOUNTER — Ambulatory Visit (INDEPENDENT_AMBULATORY_CARE_PROVIDER_SITE_OTHER): Payer: HMO | Admitting: Internal Medicine

## 2020-09-01 ENCOUNTER — Other Ambulatory Visit: Payer: Self-pay

## 2020-09-01 ENCOUNTER — Encounter: Payer: Self-pay | Admitting: Internal Medicine

## 2020-09-01 VITALS — BP 136/82 | HR 81 | Temp 97.7°F | Ht 62.0 in | Wt 255.0 lb

## 2020-09-01 DIAGNOSIS — I10 Essential (primary) hypertension: Secondary | ICD-10-CM | POA: Diagnosis not present

## 2020-09-01 DIAGNOSIS — I723 Aneurysm of iliac artery: Secondary | ICD-10-CM

## 2020-09-01 DIAGNOSIS — E785 Hyperlipidemia, unspecified: Secondary | ICD-10-CM

## 2020-09-01 DIAGNOSIS — E118 Type 2 diabetes mellitus with unspecified complications: Secondary | ICD-10-CM

## 2020-09-01 DIAGNOSIS — E1169 Type 2 diabetes mellitus with other specified complication: Secondary | ICD-10-CM | POA: Diagnosis not present

## 2020-09-01 DIAGNOSIS — J449 Chronic obstructive pulmonary disease, unspecified: Secondary | ICD-10-CM

## 2020-09-01 LAB — POCT GLYCOSYLATED HEMOGLOBIN (HGB A1C): Hemoglobin A1C: 6.5 % — AB (ref 4.0–5.6)

## 2020-09-01 MED ORDER — GLUCOSE BLOOD VI STRP: ORAL_STRIP | 3 refills | Status: AC

## 2020-09-01 MED ORDER — METFORMIN HCL 500 MG PO TABS: 500.0000 mg | ORAL_TABLET | Freq: Two times a day (BID) | ORAL | 1 refills | Status: DC

## 2020-09-01 MED ORDER — LEVEMIR FLEXTOUCH 100 UNIT/ML ~~LOC~~ SOPN: PEN_INJECTOR | SUBCUTANEOUS | 4 refills | Status: DC

## 2020-09-01 MED ORDER — ONETOUCH DELICA LANCETS 33G MISC: 1.0000 | Freq: Two times a day (BID) | 3 refills | Status: AC

## 2020-09-01 MED ORDER — ALENDRONATE SODIUM 70 MG PO TABS: ORAL_TABLET | ORAL | 4 refills | Status: DC

## 2020-09-01 MED ORDER — LOSARTAN POTASSIUM 25 MG PO TABS: 25.0000 mg | ORAL_TABLET | Freq: Every day | ORAL | 1 refills | Status: DC

## 2020-09-01 MED ORDER — INSULIN ASPART 100 UNIT/ML FLEXPEN: 30.0000 [IU] | PEN_INJECTOR | Freq: Three times a day (TID) | SUBCUTANEOUS | 3 refills | Status: DC

## 2020-09-01 MED ORDER — BREO ELLIPTA 200-25 MCG/INH IN AEPB: INHALATION_SPRAY | RESPIRATORY_TRACT | 3 refills | Status: DC

## 2020-09-01 MED ORDER — BD PEN NEEDLE MINI U/F 31G X 5 MM MISC: 3 refills | Status: DC

## 2020-09-01 MED ORDER — TRULICITY 1.5 MG/0.5ML ~~LOC~~ SOAJ: SUBCUTANEOUS | 1 refills | Status: DC

## 2020-09-01 MED ORDER — TRIAMTERENE-HCTZ 37.5-25 MG PO TABS: 1.0000 | ORAL_TABLET | Freq: Every day | ORAL | 3 refills | Status: DC

## 2020-09-01 MED ORDER — ATORVASTATIN CALCIUM 20 MG PO TABS
20.0000 mg | ORAL_TABLET | Freq: Every day | ORAL | 1 refills | Status: DC
Start: 2020-09-01 — End: 2021-02-16

## 2020-09-01 MED ORDER — GEMFIBROZIL 600 MG PO TABS: 600.0000 mg | ORAL_TABLET | Freq: Two times a day (BID) | ORAL | 1 refills | Status: DC

## 2020-11-26 ENCOUNTER — Other Ambulatory Visit: Payer: Self-pay | Admitting: Internal Medicine

## 2020-11-26 DIAGNOSIS — K209 Esophagitis, unspecified without bleeding: Secondary | ICD-10-CM

## 2021-01-15 ENCOUNTER — Other Ambulatory Visit: Payer: Self-pay

## 2021-01-15 ENCOUNTER — Encounter: Payer: Self-pay | Admitting: Internal Medicine

## 2021-01-15 ENCOUNTER — Telehealth: Payer: Self-pay | Admitting: Internal Medicine

## 2021-01-15 ENCOUNTER — Ambulatory Visit (INDEPENDENT_AMBULATORY_CARE_PROVIDER_SITE_OTHER): Payer: HMO | Admitting: Internal Medicine

## 2021-01-15 VITALS — BP 122/72 | HR 68 | Temp 97.6°F | Ht 62.0 in | Wt 246.0 lb

## 2021-01-15 DIAGNOSIS — E118 Type 2 diabetes mellitus with unspecified complications: Secondary | ICD-10-CM

## 2021-01-15 DIAGNOSIS — Z0000 Encounter for general adult medical examination without abnormal findings: Secondary | ICD-10-CM | POA: Diagnosis not present

## 2021-01-15 DIAGNOSIS — I10 Essential (primary) hypertension: Secondary | ICD-10-CM | POA: Diagnosis not present

## 2021-01-15 DIAGNOSIS — Z23 Encounter for immunization: Secondary | ICD-10-CM | POA: Diagnosis not present

## 2021-01-15 DIAGNOSIS — E785 Hyperlipidemia, unspecified: Secondary | ICD-10-CM | POA: Diagnosis not present

## 2021-01-15 DIAGNOSIS — E1169 Type 2 diabetes mellitus with other specified complication: Secondary | ICD-10-CM

## 2021-01-15 DIAGNOSIS — M818 Other osteoporosis without current pathological fracture: Secondary | ICD-10-CM | POA: Diagnosis not present

## 2021-01-15 DIAGNOSIS — Z1231 Encounter for screening mammogram for malignant neoplasm of breast: Secondary | ICD-10-CM | POA: Diagnosis not present

## 2021-01-15 LAB — POCT URINALYSIS DIPSTICK
Bilirubin, UA: NEGATIVE
Blood, UA: NEGATIVE
Glucose, UA: NEGATIVE
Ketones, UA: NEGATIVE
Leukocytes, UA: NEGATIVE
Nitrite, UA: NEGATIVE
Protein, UA: NEGATIVE
Spec Grav, UA: 1.015 (ref 1.010–1.025)
Urobilinogen, UA: 0.2 E.U./dL
pH, UA: 6 (ref 5.0–8.0)

## 2021-01-15 NOTE — Telephone Encounter (Signed)
Pt states after her cpe today, she forgot the paper she needs to take to [facility] for her mammogram. Pt asked if you could put it in the mail, she would appreciate

## 2021-01-15 NOTE — Telephone Encounter (Signed)
Spoke to pt she stated its ok to mail it to her.  KP

## 2021-01-15 NOTE — Progress Notes (Signed)
Date:  01/15/2021   Name:  Molly Hoover   DOB:  [birth year]   MRN:  [id number]   Chief Complaint: Annual Exam (Breast exam no pap ) and Flu Vaccine Molly Hoover is a 75 y.o. female who presents today for her Complete Annual Exam. She feels well. She reports exercising walking 7 days a week. She reports she is sleeping well. Breast complaints none. She is coping fairly well with her husband's advancing dementia but does not have much help.  Her daughter comes once a week to visit and help out.  Mammogram: 01/2020 DEXA: none Colonoscopy: 2013 repeat 2023  Immunization History  Administered Date(s) Administered   Fluad Quad(high Dose 65+) 12/29/2018, 01/02/2020   Influenza, High Dose Seasonal PF 12/28/2017   Influenza,inj,Quad PF,6+ Mos 01/02/2015, 12/31/2016   PFIZER(Purple Top)SARS-COV-2 Vaccination 06/15/2019, 07/03/2019, 02/19/2020   Pneumococcal Conjugate-13 03/15/2014   Pneumococcal Polysaccharide-23 04/27/2008, 12/31/2016   Zoster, Live 04/27/2008    Diabetes She presents for her follow-up diabetic visit. She has type 2 diabetes mellitus. Her disease course has been stable. Pertinent negatives for hypoglycemia include no dizziness, headaches, nervousness/anxiousness or tremors. Pertinent negatives for diabetes include no chest pain, no fatigue, no polydipsia and no polyuria. Current diabetic treatment includes oral agent (monotherapy) and insulin injections (Trulicity, levemir, novolog, metformin). She is compliant with treatment all of the time. She is following a generally healthy diet. When asked about meal planning, she reported none. She rarely participates in exercise. An ACE inhibitor/angiotensin II receptor blocker is being taken. Eye exam is current.  Hypertension This is a chronic problem. The problem is controlled. Pertinent negatives include no chest pain, headaches, palpitations or shortness of breath. Past treatments include angiotensin blockers and  diuretics. The current treatment provides significant improvement. There is no history of kidney disease or CAD/MI.  Hyperlipidemia This is a chronic problem. The problem is controlled. Pertinent negatives include no chest pain or shortness of breath. Current antihyperlipidemic treatment includes statins and bile acid sequestrants. The current treatment provides significant improvement of lipids.  Osteoporosis - no record of DEXA found.  Pt has been on Alendronate for a number of years without side effects. Vitamin D level was normal last year.  Lab Results  Component Value Date   CREATININE 0.77 01/02/2020   BUN 14 01/02/2020   NA 143 01/02/2020   K 5.3 (H) 01/02/2020   CL 104 01/02/2020   CO2 23 01/02/2020   Lab Results  Component Value Date   CHOL 148 01/02/2020   HDL 47 01/02/2020   LDLCALC 82 01/02/2020   TRIG 104 01/02/2020   CHOLHDL 3.1 01/02/2020   Lab Results  Component Value Date   TSH 3.220 01/02/2020   Lab Results  Component Value Date   HGBA1C 6.5 (A) 09/01/2020   Lab Results  Component Value Date   WBC 5.0 01/02/2020   HGB 13.4 01/02/2020   HCT 40.4 01/02/2020   MCV 88 01/02/2020   PLT 194 01/02/2020   Lab Results  Component Value Date   ALT 20 01/02/2020   AST 25 01/02/2020   ALKPHOS 72 01/02/2020   BILITOT 0.6 01/02/2020  Last vitamin D Lab Results  Component Value Date   VD25OH 70.1 01/02/2020      Review of Systems  Constitutional:  Negative for chills, fatigue and fever.  HENT:  Negative for congestion, hearing loss, tinnitus, trouble swallowing and voice change.   Eyes:  Negative for visual disturbance.  Respiratory:  Negative for cough,  chest tightness, shortness of breath and wheezing.   Cardiovascular:  Negative for chest pain, palpitations and leg swelling.  Gastrointestinal:  Negative for abdominal pain, constipation, diarrhea and vomiting.  Endocrine: Negative for polydipsia and polyuria.  Genitourinary:  Negative for dysuria,  frequency, genital sores, vaginal bleeding and vaginal discharge.  Musculoskeletal:  Negative for arthralgias, gait problem and joint swelling.  Skin:  Negative for color change and rash.  Neurological:  Negative for dizziness, tremors, light-headedness and headaches.  Hematological:  Negative for adenopathy. Does not bruise/bleed easily.  Psychiatric/Behavioral:  Negative for dysphoric mood and sleep disturbance. The patient is not nervous/anxious.    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/19/2014   Edema extremities 11/19/2014   Esophagitis determined by endoscopy 11/19/2014   Hyperlipidemia associated with type 2 diabetes mellitus ([place]) 11/19/2014   Idiopathic osteoporosis 11/19/2014   Tobacco abuse, 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   Venous insufficiency of leg 11/19/2014   Type II diabetes mellitus with complication ([place]) 04/28/7251   Morbid obesity ([place]) 10/17/2013   Essential hypertension 10/17/2013   Chronic cough 10/17/2013    Allergies  Allergen Reactions   Codeine Hives and Itching   Combivent [Ipratropium-Albuterol] Itching   Ivp Dye [Iodinated Diagnostic Agents] Hives and Itching   Meloxicam     Past Surgical History:  Procedure Laterality Date   BREAST BIOPSY     CARPAL TUNNEL RELEASE     right   CERVICAL FUSION  2002   CHOLECYSTECTOMY     ESOPHAGOGASTRODUODENOSCOPY  03/2014   benign stricture dilated   HERNIA REPAIR     LUMBAR FUSION     L1-4    REPLACEMENT TOTAL KNEE Left 12/22/208   TOTAL ABDOMINAL HYSTERECTOMY      Social History   Tobacco Use   Smoking status: Former    Packs/day: 2.00    Years: 15.00    Pack  years: 30.00    Types: Cigarettes    Quit date: 1989    Years since quitting: 33.7   Smokeless tobacco: Never   Tobacco comments:    smoking cessation materials not required  Vaping Use   Vaping Use: Never used  Substance Use Topics   Alcohol use: Yes    Comment: rare   Drug use: No     Medication list has been reviewed and updated.  Current Meds  Medication Sig   Albuterol Sulfate (PROAIR RESPICLICK) 664 (90 Base) MCG/ACT AEPB Inhale 1-2 puffs into the lungs every 4 (four) hours as needed.   alendronate (FOSAMAX) 70 MG tablet TAKE 1 TABLET EVERY WEEK   atorvastatin (LIPITOR) 20 MG tablet Take 1 tablet (20 mg total) by mouth at bedtime.   Cranberry 500 MG CAPS Take 500 mg by mouth daily.    fluticasone furoate-vilanterol (BREO ELLIPTA) 200-25 MCG/INH AEPB USE 1 INHALATION INTO THE  LUNGS ORALLY DAILY   gemfibrozil (LOPID) 600 MG tablet Take 1 tablet (600 mg total) by mouth 2 (two) times daily.   glucose blood (ONE TOUCH ULTRA TEST) test strip Use 2 times per day to test blood sugar.   insulin aspart (NOVOLOG) 100 UNIT/ML FlexPen Inject 30 Units into the skin  3 (three) times daily with meals.   insulin detemir (LEVEMIR FLEXTOUCH) 100 UNIT/ML FlexPen 40 u AM and 30 u PM   Insulin Pen Needle (B-D UF III MINI PEN NEEDLES) 31G X 5 MM MISC USE AND DISCARD 1 PEN      NEEDLE 4 TIMES A DAY   losartan (COZAAR) 25 MG tablet Take 1 tablet (25 mg total) by mouth daily.   metFORMIN (GLUCOPHAGE) 500 MG tablet Take 1 tablet (500 mg total) by mouth 2 (two) times daily.   omeprazole (PRILOSEC) 20 MG capsule TAKE 1 CAPSULE(20 MG) BY MOUTH DAILY   OneTouch Delica Lancets 16X MISC 1 each by Does not apply route 2 (two) times daily.   triamterene-hydrochlorothiazide (MAXZIDE-25) 37.5-25 MG tablet Take 1 tablet by mouth daily.   Vitamin D, Ergocalciferol, (DRISDOL) 1.25 MG (50000 UNIT) CAPS capsule TAKE 1 CAPSULE TWICE WEEKLY   Current Facility-Administered Medications for the 01/15/21 encounter (Office  Visit) with Glean Hess, MD  Medication   albuterol (PROVENTIL) (2.5 MG/3ML) 0.083% nebulizer solution 2.5 mg    PHQ 2/9 Scores 01/15/2021 09/01/2020 04/21/2020 08/27/2019  PHQ - 2 Score 0 0 0 0  PHQ- 9 Score 0 0 0 0    GAD 7 : Generalized Anxiety Score 01/15/2021 09/01/2020 04/21/2020  Nervous, Anxious, on Edge 0 0 0  Control/stop worrying 0 0 1  Worry too much - different things 0 0 1  Trouble relaxing 0 0 0  Restless 0 0 0  Easily annoyed or irritable 0 0 0  Afraid - awful might happen 0 0 0  Total GAD 7 Score 0 0 2    BP Readings from Last 3 Encounters:  01/15/21 122/72  09/01/20 136/82  04/21/20 140/76    Physical Exam Vitals and nursing note reviewed.  Constitutional:      General: She is not in acute distress.    Appearance: She is well-developed.  HENT:     Head: Normocephalic and atraumatic.     Right Ear: Tympanic membrane and ear canal normal.     Left Ear: Tympanic membrane and ear canal normal.     Nose:     Right Sinus: No maxillary sinus tenderness.     Left Sinus: No maxillary sinus tenderness.  Eyes:     General: No scleral icterus.       Right eye: No discharge.        Left eye: No discharge.     Conjunctiva/sclera: Conjunctivae normal.  Neck:     Thyroid: No thyromegaly.     Vascular: No carotid bruit.  Cardiovascular:     Rate and Rhythm: Normal rate and regular rhythm.     Pulses: Normal pulses.     Heart sounds: Normal heart sounds.  Pulmonary:     Effort: Pulmonary effort is normal. No respiratory distress.     Breath sounds: No wheezing.  Chest:  Breasts:    Right: No mass, nipple discharge, skin change or tenderness.     Left: No mass, nipple discharge, skin change or tenderness.  Abdominal:     General: Bowel sounds are normal.     Palpations: Abdomen is soft.     Tenderness: There is no abdominal tenderness.  Musculoskeletal:     Cervical back: Normal range of motion. No erythema.     Right lower leg: No edema.     Left lower  leg: No edema.  Lymphadenopathy:     Cervical: No cervical adenopathy.  Skin:  General: Skin is warm and dry.     Findings: No rash.  Neurological:     Mental Status: She is alert and oriented to person, place, and time.     Cranial Nerves: No cranial nerve deficit.     Sensory: No sensory deficit.     Deep Tendon Reflexes: Reflexes are normal and symmetric.  Psychiatric:        Attention and Perception: Attention normal.        Mood and Affect: Mood normal.    Wt Readings from Last 3 Encounters:  01/15/21 246 lb (111.6 kg)  09/01/20 255 lb (115.7 kg)  04/21/20 248 lb (112.5 kg)    BP 122/72   Pulse 68   Temp 97.6 F (36.4 C) (Oral)   Ht 5\' 2"  (1.575 m)   Wt 246 lb (111.6 kg)   BMI 44.99 kg/m   Assessment and Plan: 1. Annual physical exam Exam is normal except for weight. Encourage regular exercise and appropriate dietary changes. Up to date on immunizations and screenings.  2. Encounter for screening mammogram for breast cancer Schedule at [facility] - MM 3D SCREEN BREAST BILATERAL  3. Essential hypertension Clinically stable exam with well controlled BP. Tolerating medications without side effects at this time. Pt to continue current regimen and low sodium diet; benefits of regular exercise as able discussed. - CBC with Differential/Platelet - Comprehensive metabolic panel - POCT urinalysis dipstick  4. Type II diabetes mellitus with complication (HCC) Clinically stable by exam and report without s/s of hypoglycemia. DM complicated by hypertension and dyslipidemia. Tolerating medications well without side effects or other concerns. - Comprehensive metabolic panel - Hemoglobin A1c - TSH  5. Hyperlipidemia associated with type 2 diabetes mellitus ([place]) Tolerating statin medication without side effects at this time LDL is at goal of < 70 on current dose Continue same therapy without change at this time. - Lipid panel  6. Idiopathic osteoporosis No recent  fractures On Alendronate weekly and daily vitamin D Continue current therapy - she declines DEXA   Partially dictated using Editor, commissioning. Any errors are unintentional.  Halina Maidens, MD [facility]  01/15/2021

## 2021-01-16 LAB — COMPREHENSIVE METABOLIC PANEL
ALT: 14 IU/L (ref 0–32)
AST: 22 IU/L (ref 0–40)
Albumin/Globulin Ratio: 2 (ref 1.2–2.2)
Albumin: 4.6 g/dL (ref 3.7–4.7)
Alkaline Phosphatase: 87 IU/L (ref 44–121)
BUN/Creatinine Ratio: 23 (ref 12–28)
BUN: 20 mg/dL (ref 8–27)
Bilirubin Total: 0.5 mg/dL (ref 0.0–1.2)
CO2: 20 mmol/L (ref 20–29)
Calcium: 9.9 mg/dL (ref 8.7–10.3)
Chloride: 101 mmol/L (ref 96–106)
Creatinine, Ser: 0.88 mg/dL (ref 0.57–1.00)
Globulin, Total: 2.3 g/dL (ref 1.5–4.5)
Glucose: 156 mg/dL — ABNORMAL HIGH (ref 65–99)
Potassium: 4.7 mmol/L (ref 3.5–5.2)
Sodium: 140 mmol/L (ref 134–144)
Total Protein: 6.9 g/dL (ref 6.0–8.5)
eGFR: 68 mL/min/{1.73_m2} (ref >59)

## 2021-01-16 LAB — HEMOGLOBIN A1C
Est. average glucose Bld gHb Est-mCnc: 143 mg/dL
Hgb A1c MFr Bld: 6.6 % — ABNORMAL HIGH (ref 4.8–5.6)

## 2021-01-16 LAB — CBC WITH DIFFERENTIAL/PLATELET
Basophils Absolute: 0 10*3/uL (ref 0.0–0.2)
Basos: 0 %
EOS (ABSOLUTE): 0.2 10*3/uL (ref 0.0–0.4)
Eos: 3 %
Hematocrit: 38.7 % (ref 34.0–46.6)
Hemoglobin: 12.8 g/dL (ref 11.1–15.9)
Immature Grans (Abs): 0 10*3/uL (ref 0.0–0.1)
Immature Granulocytes: 0 %
Lymphocytes Absolute: 0.9 10*3/uL (ref 0.7–3.1)
Lymphs: 19 %
MCH: 30 pg (ref 26.6–33.0)
MCHC: 33.1 g/dL (ref 31.5–35.7)
MCV: 91 fL (ref 79–97)
Monocytes Absolute: 0.3 10*3/uL (ref 0.1–0.9)
Monocytes: 6 %
Neutrophils Absolute: 3.5 10*3/uL (ref 1.4–7.0)
Neutrophils: 72 %
Platelets: 201 10*3/uL (ref 150–450)
RBC: 4.27 x10E6/uL (ref 3.77–5.28)
RDW: 13 % (ref 11.7–15.4)
WBC: 4.8 10*3/uL (ref 3.4–10.8)

## 2021-01-16 LAB — LIPID PANEL
Chol/HDL Ratio: 2.8 ratio (ref 0.0–4.4)
Cholesterol, Total: 156 mg/dL (ref 100–199)
HDL: 55 mg/dL (ref >39)
LDL Chol Calc (NIH): 85 mg/dL (ref 0–99)
Triglycerides: 86 mg/dL (ref 0–149)
VLDL Cholesterol Cal: 16 mg/dL (ref 5–40)

## 2021-01-16 LAB — TSH: TSH: 2.89 u[IU]/mL (ref 0.450–4.500)

## 2021-01-16 NOTE — Telephone Encounter (Signed)
Completed and mailed to pt.  KP

## 2021-02-06 DIAGNOSIS — Z1231 Encounter for screening mammogram for malignant neoplasm of breast: Secondary | ICD-10-CM | POA: Diagnosis not present

## 2021-02-14 ENCOUNTER — Other Ambulatory Visit: Payer: Self-pay | Admitting: Internal Medicine

## 2021-02-14 DIAGNOSIS — E118 Type 2 diabetes mellitus with unspecified complications: Secondary | ICD-10-CM

## 2021-02-15 NOTE — Telephone Encounter (Signed)
Requested Prescriptions  Pending Prescriptions Disp Refills  . Dulaglutide (TRULICITY) 1.5 SF/6.8LE SOPN [Pharmacy Med Name: TRULICITY 1.5MG /0.5ML SDP 0.5ML] 6 mL 0    Sig: ADMINISTER 1.5 MG UNDER THE SKIN 1 TIME WEEKLY     Endocrinology:  Diabetes - GLP-1 Receptor Agonists Passed - 02/14/2021 10:16 AM      Passed - HBA1C is between 0 and 7.9 and within 180 days    Hgb A1c MFr Bld  Date Value Ref Range Status  01/15/2021 6.6 (H) 4.8 - 5.6 % Final    Comment:             Prediabetes: 5.7 - 6.4          Diabetes: >6.4          Glycemic control for adults with diabetes: <7.0          Passed - Valid encounter within last 6 months    Recent Outpatient Visits          1 month ago Annual physical exam   [facility] Glean Hess, MD   5 months ago Type II diabetes mellitus with complication [facility])   [facility] Glean Hess, MD   10 months ago Type II diabetes mellitus with complication [facility])   [facility] Glean Hess, MD   1 year ago Annual physical exam   [facility] Glean Hess, MD   1 year ago Essential hypertension   [facility] Glean Hess, MD      Future Appointments            In 3 months Army Melia Jesse Sans, MD [facility], [place]   In 11 months Army Melia Jesse Sans, MD [facility], [facility]

## 2021-02-16 ENCOUNTER — Other Ambulatory Visit: Payer: Self-pay | Admitting: Internal Medicine

## 2021-02-16 DIAGNOSIS — I10 Essential (primary) hypertension: Secondary | ICD-10-CM

## 2021-02-16 DIAGNOSIS — E118 Type 2 diabetes mellitus with unspecified complications: Secondary | ICD-10-CM

## 2021-02-16 NOTE — Telephone Encounter (Signed)
Requested Prescriptions  Pending Prescriptions Disp Refills  . atorvastatin (LIPITOR) 20 MG tablet [Pharmacy Med Name: ATORVASTATIN 20MG TABLETS] 90 tablet 3    Sig: TAKE 1 TABLET(20 MG) BY MOUTH AT BEDTIME     Cardiovascular:  Antilipid - Statins Passed - 02/16/2021  7:09 AM      Passed - Total Cholesterol in normal range and within 360 days    Cholesterol, Total  Date Value Ref Range Status  01/15/2021 156 100 - 199 mg/dL Final         Passed - LDL in normal range and within 360 days    LDL Chol Calc (NIH)  Date Value Ref Range Status  01/15/2021 85 0 - 99 mg/dL Final         Passed - HDL in normal range and within 360 days    HDL  Date Value Ref Range Status  01/15/2021 55 >39 mg/dL Final         Passed - Triglycerides in normal range and within 360 days    Triglycerides  Date Value Ref Range Status  01/15/2021 86 0 - 149 mg/dL Final         Passed - Patient is not pregnant      Passed - Valid encounter within last 12 months    Recent Outpatient Visits          1 month ago Annual physical exam   [facility] Glean Hess, MD   5 months ago Type II diabetes mellitus with complication [facility])   [facility] Glean Hess, MD   10 months ago Type II diabetes mellitus with complication [facility])   [facility] Glean Hess, MD   1 year ago Annual physical exam   [facility] Glean Hess, MD   1 year ago Essential hypertension   [facility] Glean Hess, MD      Future Appointments            In 3 months Army Melia Jesse Sans, MD [facility], [place]   In 11 months Army Melia Jesse Sans, MD [facility], [place]           . gemfibrozil (LOPID) 600 MG tablet [Pharmacy Med Name: GEMFIBROZIL 600MG TABLETS] 180 tablet 3    Sig: TAKE 1 TABLET(600 MG) BY MOUTH TWICE DAILY     Cardiovascular:  Antilipid - Fibric Acid Derivatives Passed - 02/16/2021  7:09 AM      Passed - Total Cholesterol in  normal range and within 360 days    Cholesterol, Total  Date Value Ref Range Status  01/15/2021 156 100 - 199 mg/dL Final         Passed - LDL in normal range and within 360 days    LDL Chol Calc (NIH)  Date Value Ref Range Status  01/15/2021 85 0 - 99 mg/dL Final         Passed - HDL in normal range and within 360 days    HDL  Date Value Ref Range Status  01/15/2021 55 >39 mg/dL Final         Passed - Triglycerides in normal range and within 360 days    Triglycerides  Date Value Ref Range Status  01/15/2021 86 0 - 149 mg/dL Final         Passed - ALT in normal range and within 180 days    ALT  Date Value Ref Range Status  01/15/2021 14 0 -  32 IU/L Final         Passed - AST in normal range and within 180 days    AST  Date Value Ref Range Status  01/15/2021 22 0 - 40 IU/L Final         Passed - Cr in normal range and within 180 days    Creatinine, Ser  Date Value Ref Range Status  01/15/2021 0.88 0.57 - 1.00 mg/dL Final         Passed - eGFR in normal range and within 180 days    GFR calc Af Amer  Date Value Ref Range Status  01/02/2020 88 >59 mL/min/1.73 Final    Comment:    **[facility] currently reports eGFR in compliance with the current**   recommendations of the [employer]. [facility] will   update reporting as new guidelines are published from the NKF-ASN   Task force.    GFR calc non Af Amer  Date Value Ref Range Status  01/02/2020 76 >59 mL/min/1.73 Final   eGFR  Date Value Ref Range Status  01/15/2021 68 >59 mL/min/1.73 Final         Passed - Valid encounter within last 12 months    Recent Outpatient Visits          1 month ago Annual physical exam   [facility] Glean Hess, MD   5 months ago Type II diabetes mellitus with complication [facility])   [facility] Glean Hess, MD   10 months ago Type II diabetes mellitus with complication [facility])   [facility] Glean Hess, MD   1 year ago  Annual physical exam   [facility] Glean Hess, MD   1 year ago Essential hypertension   [facility] Glean Hess, MD      Future Appointments            In 3 months Glean Hess, MD [facility], [place]   In 11 months Glean Hess, MD [facility], [facility]           . losartan (COZAAR) 25 MG tablet [Pharmacy Med Name: LOSARTAN 25MG TABLETS] 90 tablet 1    Sig: TAKE 1 TABLET(25 MG) BY MOUTH DAILY     Cardiovascular:  Angiotensin Receptor Blockers Passed - 02/16/2021  7:09 AM      Passed - Cr in normal range and within 180 days    Creatinine, Ser  Date Value Ref Range Status  01/15/2021 0.88 0.57 - 1.00 mg/dL Final         Passed - K in normal range and within 180 days    Potassium  Date Value Ref Range Status  01/15/2021 4.7 3.5 - 5.2 mmol/L Final         Passed - Patient is not pregnant      Passed - Last BP in normal range    BP Readings from Last 1 Encounters:  01/15/21 122/72         Passed - Valid encounter within last 6 months    Recent Outpatient Visits          1 month ago Annual physical exam   [facility] Glean Hess, MD   5 months ago Type II diabetes mellitus with complication [facility])   [facility] Glean Hess, MD   10 months ago Type II diabetes mellitus with complication [facility])   [facility] Glean Hess,  MD   1 year ago Annual physical exam   [facility] Glean Hess, MD   1 year ago Essential hypertension   [facility] Glean Hess, MD      Future Appointments            In 3 months Glean Hess, MD [facility], [place]   In 11 months Glean Hess, MD [facility], [facility]           . metFORMIN (GLUCOPHAGE) 500 MG tablet [Pharmacy Med Name: METFORMIN 500MG TABLETS] 180 tablet 0    Sig: TAKE 1 TABLET(500 MG) BY MOUTH TWICE DAILY     Endocrinology:  Diabetes - Biguanides Passed - 02/16/2021   7:09 AM      Passed - Cr in normal range and within 360 days    Creatinine, Ser  Date Value Ref Range Status  01/15/2021 0.88 0.57 - 1.00 mg/dL Final         Passed - HBA1C is between 0 and 7.9 and within 180 days    Hgb A1c MFr Bld  Date Value Ref Range Status  01/15/2021 6.6 (H) 4.8 - 5.6 % Final    Comment:             Prediabetes: 5.7 - 6.4          Diabetes: >6.4          Glycemic control for adults with diabetes: <7.0          Passed - eGFR in normal range and within 360 days    GFR calc Af Amer  Date Value Ref Range Status  01/02/2020 88 >59 mL/min/1.73 Final    Comment:    **[facility] currently reports eGFR in compliance with the current**   recommendations of the [employer]. [facility] will   update reporting as new guidelines are published from the NKF-ASN   Task force.    GFR calc non Af Amer  Date Value Ref Range Status  01/02/2020 76 >59 mL/min/1.73 Final   eGFR  Date Value Ref Range Status  01/15/2021 68 >59 mL/min/1.73 Final         Passed - Valid encounter within last 6 months    Recent Outpatient Visits          1 month ago Annual physical exam   [facility] Glean Hess, MD   5 months ago Type II diabetes mellitus with complication [facility])   [facility] Glean Hess, MD   10 months ago Type II diabetes mellitus with complication [facility])   [facility] Glean Hess, MD   1 year ago Annual physical exam   [facility] Glean Hess, MD   1 year ago Essential hypertension   [facility] Glean Hess, MD      Future Appointments            In 3 months Army Melia Jesse Sans, MD [facility], [place]   In 11 months Army Melia Jesse Sans, MD [facility], [facility]

## 2021-03-13 DIAGNOSIS — H52223 Regular astigmatism, bilateral: Secondary | ICD-10-CM | POA: Diagnosis not present

## 2021-03-13 DIAGNOSIS — D3131 Benign neoplasm of right choroid: Secondary | ICD-10-CM | POA: Diagnosis not present

## 2021-03-13 DIAGNOSIS — E119 Type 2 diabetes mellitus without complications: Secondary | ICD-10-CM | POA: Diagnosis not present

## 2021-03-13 DIAGNOSIS — H35013 Changes in retinal vascular appearance, bilateral: Secondary | ICD-10-CM | POA: Diagnosis not present

## 2021-03-13 LAB — HM DIABETES EYE EXAM

## 2021-03-30 ENCOUNTER — Telehealth: Payer: Self-pay | Admitting: Internal Medicine

## 2021-03-30 NOTE — Telephone Encounter (Signed)
Shalon from [employer] called stating she had contacted pt about a case management program for diabetes. She stated the patient declined services. She wanted you to know if patient needs these services in the future to contact her please.

## 2021-03-30 NOTE — Telephone Encounter (Signed)
If patient declined, then there is nothing further that needs to be done. Thank you.

## 2021-05-06 ENCOUNTER — Telehealth: Payer: Self-pay | Admitting: Internal Medicine

## 2021-05-06 NOTE — Telephone Encounter (Signed)
Changed in patients record.

## 2021-05-06 NOTE — Telephone Encounter (Signed)
Copied from [place] [phone]. Topic: General - Other >> May 06, 2021 11:39 AM Bayard Beaver wrote: Reason for [id number] called in [place], [place]  Phone:  [phone] Fax:  [phone]

## 2021-05-17 ENCOUNTER — Other Ambulatory Visit: Payer: Self-pay | Admitting: Internal Medicine

## 2021-05-17 DIAGNOSIS — K209 Esophagitis, unspecified without bleeding: Secondary | ICD-10-CM

## 2021-05-17 DIAGNOSIS — J449 Chronic obstructive pulmonary disease, unspecified: Secondary | ICD-10-CM

## 2021-05-17 DIAGNOSIS — E118 Type 2 diabetes mellitus with unspecified complications: Secondary | ICD-10-CM

## 2021-05-17 NOTE — Telephone Encounter (Signed)
Requested medication (s) are due for refill today: yes  Requested medication (s) are on the active medication list: yes  Last refill:  09/01/20 3 each 3 RF  Future visit scheduled: yes  Notes to clinic:  med not assigned to a protocol   Requested Prescriptions  Pending Prescriptions Disp Refills   BREO ELLIPTA 200-25 MCG/ACT AEPB [Pharmacy Med Name: BREO ELLIPTA 200-25MCG ORAL INH(30)] 60 each     Sig: USE 1 INHALATION BY MOUTH DAILY.     There is no refill protocol information for this order    Signed Prescriptions Disp Refills   metFORMIN (GLUCOPHAGE) 500 MG tablet 180 tablet 0    Sig: TAKE 1 TABLET(500 MG) BY MOUTH TWICE DAILY     Endocrinology:  Diabetes - Biguanides Passed - 05/17/2021  7:12 AM      Passed - Cr in normal range and within 360 days    Creatinine, Ser  Date Value Ref Range Status  01/15/2021 0.88 0.57 - 1.00 mg/dL Final          Passed - HBA1C is between 0 and 7.9 and within 180 days    Hgb A1c MFr Bld  Date Value Ref Range Status  01/15/2021 6.6 (H) 4.8 - 5.6 % Final    Comment:             Prediabetes: 5.7 - 6.4          Diabetes: >6.4          Glycemic control for adults with diabetes: <7.0           Passed - eGFR in normal range and within 360 days    GFR calc Af Amer  Date Value Ref Range Status  01/02/2020 88 >59 mL/min/1.73 Final    Comment:    **[facility] currently reports eGFR in compliance with the current**   recommendations of the [employer]. [facility] will   update reporting as new guidelines are published from the NKF-ASN   Task force.    GFR calc non Af Amer  Date Value Ref Range Status  01/02/2020 76 >59 mL/min/1.73 Final   eGFR  Date Value Ref Range Status  01/15/2021 68 >59 mL/min/1.73 Final          Passed - Valid encounter within last 6 months    Recent Outpatient Visits           4 months ago Annual physical exam   [facility] Glean Hess, MD   8 months ago Type II diabetes mellitus  with complication [facility])   [facility] Glean Hess, MD   1 year ago Type II diabetes mellitus with complication [facility])   [facility] Glean Hess, MD   1 year ago Annual physical exam   [facility] Glean Hess, MD   1 year ago Essential hypertension   [facility] Glean Hess, MD       Future Appointments             In 2 days Glean Hess, MD [facility] [place]   In 8 months Glean Hess, MD [facility]             omeprazole (PRILOSEC) 20 MG capsule 90 capsule 1    Sig: TAKE 1 CAPSULE(20 MG) BY MOUTH DAILY     Gastroenterology: Proton Pump Inhibitors Passed - 05/17/2021  7:12 AM      Passed - Valid  encounter within last 12 months    Recent Outpatient Visits           4 months ago Annual physical exam   [facility] Glean [facility], MD   8 months ago Type II diabetes mellitus with complication [facility])   [facility] Glean [facility], MD   1 year ago Type II diabetes mellitus with complication [facility])   [facility] Glean Hess, MD   1 year ago Annual physical exam   [facility] Glean Hess, MD   1 year ago Essential hypertension   [facility] Glean Hess, MD       Future Appointments             In 2 days Glean Hess, MD [facility], [place]   In 8 months Army Melia Jesse Sans, MD [facility], [facility]

## 2021-05-17 NOTE — Telephone Encounter (Signed)
Requested Prescriptions  Pending Prescriptions Disp Refills   BREO ELLIPTA 200-25 MCG/ACT AEPB [Pharmacy Med Name: BREO ELLIPTA 200-25MCG ORAL INH(30)] 60 each     Sig: USE 1 INHALATION BY MOUTH DAILY.     There is no refill protocol information for this order     metFORMIN (GLUCOPHAGE) 500 MG tablet [Pharmacy Med Name: METFORMIN 500MG TABLETS] 180 tablet 0    Sig: TAKE 1 TABLET(500 MG) BY MOUTH TWICE DAILY     Endocrinology:  Diabetes - Biguanides Passed - 05/17/2021  7:12 AM      Passed - Cr in normal range and within 360 days    Creatinine, Ser  Date Value Ref Range Status  01/15/2021 0.88 0.57 - 1.00 mg/dL Final         Passed - HBA1C is between 0 and 7.9 and within 180 days    Hgb A1c MFr Bld  Date Value Ref Range Status  01/15/2021 6.6 (H) 4.8 - 5.6 % Final    Comment:             Prediabetes: 5.7 - 6.4          Diabetes: >6.4          Glycemic control for adults with diabetes: <7.0          Passed - eGFR in normal range and within 360 days    GFR calc Af Amer  Date Value Ref Range Status  01/02/2020 88 >59 mL/min/1.73 Final    Comment:    **[facility] currently reports eGFR in compliance with the current**   recommendations of the [employer]. [facility] will   update reporting as new guidelines are published from the NKF-ASN   Task force.    GFR calc non Af Amer  Date Value Ref Range Status  01/02/2020 76 >59 mL/min/1.73 Final   eGFR  Date Value Ref Range Status  01/15/2021 68 >59 mL/min/1.73 Final         Passed - Valid encounter within last 6 months    Recent Outpatient Visits          4 months ago Annual physical exam   [facility] Glean [facility], [facility]   8 months ago Type II diabetes mellitus with complication [facility])   [facility] Gle[facility], [facility]   1 year ago Type II diabetes mellitus with complication [facility])   [facility] Glean Hess, [facility]   1 year ago Annual physical exam   [facility]  Glean Hess, MD   1 year ago Essential hypertension   [place], [place]      Future Appointments            In 2 days Glean Hess, MD [facility], [place]   In 8 months Army Melia Jesse Sans, MD [facility]            omeprazole (PRILOSEC) 20 MG capsule [Pharmacy Med Name: OMEPRAZOLE 20MG CAPSULES] 90 capsule 1    Sig: TAKE 1 CAPSULE(20 MG) BY MOUTH DAILY     Gastroenterology: Proton Pump Inhibitors Passed - 05/17/2021  7:12 AM      Passed - Valid encounter within last 12 months    Recent Outpatient Visits          4 months ago Annual physical exam   [facility] Glean Hess, MD   8 months ago Type II diabetes mellitus with complication ([place])  [facility] Glean [facility], MD   1 year ago Type II diabetes mellitus with complication [facility])   [facility] Glean [facility], MD   1 year ago Annual physical exam   [facility] Glean Hess, MD   1 year ago Essential hypertension   [facility] Gle[facility], MD      Future Appointments            In 2 days Glean Hess, MD [facility], [place]   In 8 months Army Melia Jesse Sans, MD [facility], [facility]

## 2021-05-19 ENCOUNTER — Ambulatory Visit (INDEPENDENT_AMBULATORY_CARE_PROVIDER_SITE_OTHER): Payer: HMO | Admitting: Internal Medicine

## 2021-05-19 ENCOUNTER — Other Ambulatory Visit: Payer: Self-pay

## 2021-05-19 ENCOUNTER — Encounter: Payer: Self-pay | Admitting: Internal Medicine

## 2021-05-19 VITALS — BP 128/76 | HR 68 | Ht 62.0 in | Wt 226.8 lb

## 2021-05-19 DIAGNOSIS — E118 Type 2 diabetes mellitus with unspecified complications: Secondary | ICD-10-CM | POA: Diagnosis not present

## 2021-05-19 DIAGNOSIS — E1169 Type 2 diabetes mellitus with other specified complication: Secondary | ICD-10-CM | POA: Diagnosis not present

## 2021-05-19 DIAGNOSIS — I10 Essential (primary) hypertension: Secondary | ICD-10-CM

## 2021-05-19 DIAGNOSIS — E785 Hyperlipidemia, unspecified: Secondary | ICD-10-CM

## 2021-05-19 DIAGNOSIS — J449 Chronic obstructive pulmonary disease, unspecified: Secondary | ICD-10-CM

## 2021-05-19 DIAGNOSIS — Z636 Dependent relative needing care at home: Secondary | ICD-10-CM

## 2021-05-19 DIAGNOSIS — K209 Esophagitis, unspecified without bleeding: Secondary | ICD-10-CM | POA: Diagnosis not present

## 2021-05-19 LAB — POCT GLYCOSYLATED HEMOGLOBIN (HGB A1C): Hemoglobin A1C: 6.2 % — AB (ref 4.0–5.6)

## 2021-05-19 MED ORDER — INSULIN ASPART 100 UNIT/ML FLEXPEN: 25.0000 [IU] | PEN_INJECTOR | Freq: Three times a day (TID) | SUBCUTANEOUS | 1 refills | Status: DC

## 2021-05-19 MED ORDER — METFORMIN HCL 500 MG PO TABS: ORAL_TABLET | ORAL | 1 refills | Status: DC

## 2021-05-19 MED ORDER — OMEPRAZOLE 20 MG PO CPDR: DELAYED_RELEASE_CAPSULE | ORAL | 1 refills | Status: DC

## 2021-05-19 MED ORDER — FLUTICASONE FUROATE-VILANTEROL 200-25 MCG/ACT IN AEPB: INHALATION_SPRAY | RESPIRATORY_TRACT | 1 refills | Status: DC

## 2021-05-19 MED ORDER — INSULIN ASPART 100 UNIT/ML FLEXPEN: 30.0000 [IU] | PEN_INJECTOR | Freq: Three times a day (TID) | SUBCUTANEOUS | 1 refills | Status: DC

## 2021-05-19 NOTE — Progress Notes (Signed)
Date:  05/19/2021   Name:  Molly Hoover   DOB:  11/04/1945   MRN:  [id number]   Chief Complaint: Hypertension and Diabetes  Hypertension This is a chronic problem. The problem is controlled. Pertinent negatives include no chest pain, headaches, palpitations or shortness of breath. Past treatments include diuretics and angiotensin blockers. There is no history of kidney disease, CAD/MI or CVA.  Diabetes She presents for her follow-up diabetic visit. She has type 2 diabetes mellitus. Her disease course has been stable. Pertinent negatives for hypoglycemia include no headaches or tremors. Pertinent negatives for diabetes include no chest pain, no fatigue, no polydipsia and no polyuria. Pertinent negatives for diabetic complications include no CVA. Current diabetic treatment includes insulin injections (and metformin). An ACE inhibitor/angiotensin II receptor blocker is being taken.  Gastroesophageal Reflux She complains of heartburn. She reports no abdominal pain, no chest pain, no coughing, no dysphagia, no sore throat or no wheezing. Pertinent negatives include no fatigue. She has tried a PPI for the symptoms. The treatment provided significant relief.  Hyperlipidemia This is a chronic problem. The problem is controlled. Pertinent negatives include no chest pain or shortness of breath. Current antihyperlipidemic treatment includes statins and fibric acid derivatives.   Lab Results  Component Value Date   NA 140 01/15/2021   K 4.7 01/15/2021   CO2 20 01/15/2021   GLUCOSE 156 (H) 01/15/2021   BUN 20 01/15/2021   CREATININE 0.88 01/15/2021   CALCIUM 9.9 01/15/2021   EGFR 68 01/15/2021   GFRNONAA 76 01/02/2020   Lab Results  Component Value Date   CHOL 156 01/15/2021   HDL 55 01/15/2021   LDLCALC 85 01/15/2021   TRIG 86 01/15/2021   CHOLHDL 2.8 01/15/2021   Lab Results  Component Value Date   TSH 2.890 01/15/2021   Lab Results  Component Value Date   HGBA1C 6.2 (A)  05/19/2021   Lab Results  Component Value Date   WBC 4.8 01/15/2021   HGB 12.8 01/15/2021   HCT 38.7 01/15/2021   MCV 91 01/15/2021   PLT 201 01/15/2021   Lab Results  Component Value Date   ALT 14 01/15/2021   AST 22 01/15/2021   ALKPHOS 87 01/15/2021   BILITOT 0.5 01/15/2021   Lab Results  Component Value Date   VD25OH 70.1 01/02/2020     Review of Systems  Constitutional:  Negative for appetite change, fatigue, fever and unexpected weight change.  HENT:  Negative for sore throat, tinnitus and trouble swallowing.   Eyes:  Negative for visual disturbance.  Respiratory:  Negative for cough, chest tightness, shortness of breath and wheezing.   Cardiovascular:  Negative for chest pain, palpitations and leg swelling.  Gastrointestinal:  Positive for heartburn. Negative for abdominal pain and dysphagia.  Endocrine: Negative for polydipsia and polyuria.  Genitourinary:  Negative for dysuria and hematuria.  Musculoskeletal:  Negative for arthralgias.  Neurological:  Negative for tremors, numbness and headaches.  Psychiatric/Behavioral:  Negative for dysphoric mood.    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[place]) 12/11/2015   Osteoarthritis of right knee 12/10/2015   Arthritis of hand 10/06/2015   COPD, moderate ([place]) 11/19/2014   Edema extremities 11/19/2014   Esophagitis determined by endoscopy 11/19/2014   Hyperlipidemia associated  with type 2 diabetes mellitus ([place]) 11/19/2014   Idiopathic osteoporosis 11/19/2014   Tobacco abuse, 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   Venous insufficiency of leg 11/19/2014   Type II diabetes mellitus with complication ([place]) 78/58/8502   Morbid obesity ([place]) 10/17/2013   Essential hypertension 10/17/2013   Chronic cough  10/17/2013    Allergies  Allergen Reactions   Codeine Hives and Itching   Combivent [Ipratropium-Albuterol] Itching   Ivp Dye [Iodinated Contrast Media] Hives and Itching   Meloxicam     Past Surgical History:  Procedure Laterality Date   BREAST BIOPSY     CARPAL TUNNEL RELEASE     right   CERVICAL FUSION  2002   CHOLECYSTECTOMY     ESOPHAGOGASTRODUODENOSCOPY  03/2014   benign stricture dilated   HERNIA REPAIR     LUMBAR FUSION     L1-4    REPLACEMENT TOTAL KNEE Left 12/22/208   TOTAL ABDOMINAL HYSTERECTOMY      Social History   Tobacco Use   Smoking status: Former    Packs/day: 2.00    Years: 15.00    Pack years: 30.00    Types: Cigarettes    Quit date: 1989    Years since quitting: 34.0   Smokeless tobacco: Never   Tobacco comments:    smoking cessation materials not required  Vaping Use   Vaping Use: Never used  Substance Use Topics   Alcohol use: Yes    Comment: rare   Drug use: No     Medication list has been reviewed and updated.  Current Meds  Medication Sig   Albuterol Sulfate (PROAIR RESPICLICK) 774 (90 Base) MCG/ACT AEPB Inhale 1-2 puffs into the lungs every 4 (four) hours as needed.   alendronate (FOSAMAX) 70 MG tablet TAKE 1 TABLET EVERY WEEK   atorvastatin (LIPITOR) 20 MG tablet TAKE 1 TABLET(20 MG) BY MOUTH AT BEDTIME   Cranberry 500 MG CAPS Take 500 mg by mouth daily.    Dulaglutide (TRULICITY) 1.5 JO/8.7OM SOPN ADMINISTER 1.5 MG UNDER THE SKIN 1 TIME WEEKLY   fluticasone furoate-vilanterol (BREO ELLIPTA) 200-25 MCG/ACT AEPB USE 1 INHALATION BY MOUTH DAILY.   gemfibrozil (LOPID) 600 MG tablet TAKE 1 TABLET(600 MG) BY MOUTH TWICE DAILY   glucose blood (ONE TOUCH ULTRA TEST) test strip Use 2 times per day to test blood sugar.   insulin aspart (NOVOLOG) 100 UNIT/ML FlexPen Inject 30 Units into the skin 3 (three) times daily with meals.   insulin detemir (LEVEMIR FLEXTOUCH) 100 UNIT/ML FlexPen 40 u AM and 30 u PM   Insulin Pen Needle (B-D UF  III MINI PEN NEEDLES) 31G X 5 MM MISC USE AND DISCARD 1 PEN      NEEDLE 4 TIMES A DAY   losartan (COZAAR) 25 MG tablet TAKE 1 TABLET(25 MG) BY MOUTH DAILY   metFORMIN (GLUCOPHAGE) 500 MG tablet TAKE 1 TABLET(500 MG) BY MOUTH TWICE DAILY   omeprazole (PRILOSEC) 20 MG capsule TAKE 1 CAPSULE(20 MG) BY MOUTH DAILY   OneTouch Delica Lancets 76H MISC 1 each by Does not apply route 2 (two) times daily.   triamterene-hydrochlorothiazide (MAXZIDE-25) 37.5-25 MG tablet Take 1 tablet by mouth daily.   Vitamin D, Ergocalciferol, (DRISDOL) 1.25 MG (50000 UNIT) CAPS capsule TAKE 1 CAPSULE TWICE WEEKLY   [DISCONTINUED] BREO ELLIPTA 200-25 MCG/ACT AEPB USE 1 INHALATION BY MOUTH DAILY.   [DISCONTINUED] insulin aspart (NOVOLOG) 100 UNIT/ML FlexPen Inject 30 Units into the skin 3 (three) times daily with meals.   [DISCONTINUED] metFORMIN (GLUCOPHAGE) 500  MG tablet TAKE 1 TABLET(500 MG) BY MOUTH TWICE DAILY   [DISCONTINUED] omeprazole (PRILOSEC) 20 MG capsule TAKE 1 CAPSULE(20 MG) BY MOUTH DAILY   Current Facility-Administered Medications for the 05/19/21 encounter (Office Visit) with Glean Hess, MD  Medication   albuterol (PROVENTIL) (2.5 MG/3ML) 0.083% nebulizer solution 2.5 mg    PHQ 2/9 Scores 05/19/2021 01/15/2021 09/01/2020 04/21/2020  PHQ - 2 Score 0 0 0 0  PHQ- 9 Score 0 0 0 0    GAD 7 : Generalized Anxiety Score 05/19/2021 01/15/2021 09/01/2020 04/21/2020  Nervous, Anxious, on Edge 0 0 0 0  Control/stop worrying 0 0 0 1  Worry too much - different things 2 0 0 1  Trouble relaxing 0 0 0 0  Restless 0 0 0 0  Easily annoyed or irritable 0 0 0 0  Afraid - awful might happen 0 0 0 0  Total GAD 7 Score 2 0 0 2  Anxiety Difficulty Somewhat difficult - - -    BP Readings from Last 3 Encounters:  05/19/21 128/76  01/15/21 122/72  09/01/20 136/82    Physical Exam Vitals and nursing note reviewed.  Constitutional:      General: She is not in acute distress.    Appearance: She is well-developed.  She is obese.  HENT:     Head: Normocephalic and atraumatic.  Cardiovascular:     Rate and Rhythm: Normal rate and regular rhythm.     Pulses: Normal pulses.     Heart sounds: No murmur heard. Pulmonary:     Effort: Pulmonary effort is normal. No respiratory distress.     Breath sounds: No wheezing or rhonchi.  Musculoskeletal:     Cervical back: Normal range of motion.     Right lower leg: No edema.     Left lower leg: No edema.  Lymphadenopathy:     Cervical: No cervical adenopathy.  Skin:    General: Skin is warm and dry.     Capillary Refill: Capillary refill takes less than 2 seconds.     Findings: Erythema (mild warmth distal lower legs) present. No rash.  Neurological:     General: No focal deficit present.     Mental Status: She is alert and oriented to person, place, and time.  Psychiatric:        Mood and Affect: Mood normal.        Behavior: Behavior normal.    Wt Readings from Last 3 Encounters:  05/19/21 226 lb 12.8 oz (102.9 kg)  01/15/21 246 lb (111.6 kg)  09/01/20 255 lb (115.7 kg)    BP 128/76    Pulse 68    Ht _0  (1.575 m)    Wt 226 lb 12.8 oz (102.9 kg)    SpO2 99%    BMI 41.48 kg/m   Assessment and Plan: 1. Essential hypertension Clinically stable exam with well controlled BP on losartan and maxzide. Tolerating medications without side effects at this time. Pt to continue current regimen and low sodium diet She has lost about 20 lbs since last visit - likely due to increased stress at home.  2. Type II diabetes mellitus with complication ([place]) Clinically stable by exam.  Occasional s/s of hypoglycemia easily treated at home. DM complicated by hypertension and dyslipidemia. Will continue current medication but reduce Novolog dose to 25 units AC - Microalbumin / creatinine urine ratio - POCT glycosylated hemoglobin (Hb A1C) = 6.2 down from 6.6 - metFORMIN (GLUCOPHAGE) 500 MG tablet; TAKE  1 TABLET(500 MG) BY MOUTH TWICE DAILY  Dispense: 180  tablet; Refill: 1 - insulin aspart (NOVOLOG) 100 UNIT/ML FlexPen; Inject 30 Units into the skin 3 (three) times daily with meals.  Dispense: 90 mL; Refill: 1  3. Hyperlipidemia associated with type 2 diabetes mellitus ([place]) Tolerating Statin therapy with Lopid. LDL at goal.  4. Esophagitis determined by endoscopy Symptoms well controlled on daily PPI No red flag signs such as unexplained weight loss, n/v, melena Will continue omeprazole. - omeprazole (PRILOSEC) 20 MG capsule; TAKE 1 CAPSULE(20 MG) BY MOUTH DAILY  Dispense: 90 capsule; Refill: 1  5. COPD, moderate (HCC) Stable chronic SOB is unchanged - fluticasone furoate-vilanterol (BREO ELLIPTA) 200-25 MCG/ACT AEPB; USE 1 INHALATION BY MOUTH DAILY.  Dispense: 60 each; Refill: 1  6. 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  He needs someone 24/7 to supervise and monitor. Two sons who live next door are not helpful.  She does have a daughter who comes twice a month from out of town and a hired aid who comes about 8 hours per week.   Partially dictated using Editor, commissioning. Any errors are unintentional.  Halina Maidens, MD [facility]  05/19/2021

## 2021-05-19 NOTE — Patient Instructions (Signed)
Reduce the Novolog insulin to 25 units with each meal.

## 2021-05-20 LAB — MICROALBUMIN / CREATININE URINE RATIO
Creatinine, Urine: 92.8 mg/dL
Microalb/Creat Ratio: 7 mg/g creat (ref 0–29)
Microalbumin, Urine: 6.1 ug/mL

## 2021-07-03 ENCOUNTER — Other Ambulatory Visit: Payer: Self-pay | Admitting: Internal Medicine

## 2021-07-03 DIAGNOSIS — J449 Chronic obstructive pulmonary disease, unspecified: Secondary | ICD-10-CM

## 2021-07-03 NOTE — Telephone Encounter (Signed)
Requested medication (s) are due for refill today:   Provider to review ? ?Requested medication (s) are on the active medication list:   Yes ? ?Future visit scheduled:   Yes ? ? ?Last ordered: 05/19/2021 60 each, 1 refill ? ?Returned because no protocol assigned to this medication.    ? ?Requested Prescriptions  ?Pending Prescriptions Disp Refills  ? fluticasone furoate-vilanterol (BREO ELLIPTA) 200-25 MCG/ACT AEPB [Pharmacy Med Name: FLUTICASONE FUROATE-VILANTEROL 200-] 60 each 1  ?  Sig: INHALE 1 INHALATION BY MOUTH DAILY.  ?  ? There is no refill protocol information for this order  ?  ? ?

## 2021-07-21 ENCOUNTER — Other Ambulatory Visit: Payer: Self-pay

## 2021-07-21 ENCOUNTER — Other Ambulatory Visit: Payer: Self-pay | Admitting: Internal Medicine

## 2021-07-21 DIAGNOSIS — E118 Type 2 diabetes mellitus with unspecified complications: Secondary | ICD-10-CM

## 2021-07-21 MED ORDER — TRULICITY 1.5 MG/0.5ML ~~LOC~~ SOAJ: SUBCUTANEOUS | 1 refills | Status: DC

## 2021-07-22 NOTE — Telephone Encounter (Signed)
Requested medication (s) are due for refill today:   Provider to review ? ?Requested medication (s) are on the active medication list:   Yes ? ?Future visit scheduled:   Yes ? ? ?Last ordered: 06/10/2020 #24, 4 refills ? ?Returned because this is a non delegated refill  ? ?Requested Prescriptions  ?Pending Prescriptions Disp Refills  ? Vitamin D, Ergocalciferol, (DRISDOL) 1.25 MG (50000 UNIT) CAPS capsule [Pharmacy Med Name: VITAMIN D (ERGOCALCIFEROL) 1.25 MG] 24 capsule 4  ?  Sig: TAKE ONE CAPSULE TWICE A WEEK  ?  ? Endocrinology:  Vitamins - Vitamin D Supplementation 2 Failed - 07/21/2021  9:37 AM  ?  ?  Failed - Manual Review: Route requests for 50,000 IU strength to the provider  ?  ?  Failed - Vitamin D in normal range and within 360 days  ?  Vit D, 25-Hydroxy  ?Date Value Ref Range Status  ?01/02/2020 70.1 30.0 - 100.0 ng/mL Final  ?  Comment:  ?  Vitamin D deficiency has been defined by the Institute of ?Medicine and an Endocrine Society practice guideline as a ?level of serum 25-OH vitamin D less than 20 ng/mL (1,2). ?The Endocrine Society went on to further define vitamin D ?insufficiency as a level between 21 and 29 ng/mL (2). ?1. IOM (Institute of Medicine). 2010. Dietary reference ?   intakes for calcium and D. [place]: The ?   [employer]. ?2. Holick MF, [place], Bischoff-Ferrari HA, et al. ?   Evaluation, treatment, and prevention of vitamin D ?   deficiency: an Endocrine Society clinical practice ?   guideline. JCEM. 2011 Jul; 96(7):1911-30. ?  ?  ?  ?  ?  Passed - Ca in normal range and within 360 days  ?  Calcium  ?Date Value Ref Range Status  ?01/15/2021 9.9 8.7 - 10.3 mg/dL Final  ?  ?  ?  ?  Passed - Valid encounter within last 12 months  ?  Recent Outpatient Visits   ? ?      ? 2 months ago Essential hypertension  ? [facility] Glean [facility], MD  ? 6 months ago Annual physical exam  ? [facility] Glean Hess, MD  ? 10 months ago Type II diabetes  mellitus with complication [facility])  ? [facility] Glean Hess, MD  ? 1 year ago Type II diabetes mellitus with complication [facility])  ? [facility] Glean Hess, MD  ? 1 year ago Annual physical exam  ? [facility] Glean Hess, MD  ? ?  ?  ?Future Appointments   ? ?        ? In 1 month Army Melia Jesse Sans, MD [facility], [place]  ? In 6 months Army Melia Jesse Sans, MD [facility], [place]  ? ?  ? ?  ?  ?  ? ?

## 2021-08-03 ENCOUNTER — Other Ambulatory Visit: Payer: Self-pay | Admitting: Internal Medicine

## 2021-08-03 DIAGNOSIS — E118 Type 2 diabetes mellitus with unspecified complications: Secondary | ICD-10-CM

## 2021-08-04 NOTE — Telephone Encounter (Signed)
Requested Prescriptions  ?Pending Prescriptions Disp Refills  ?? insulin detemir (LEVEMIR FLEXTOUCH) 100 UNIT/ML FlexPen [Pharmacy Med Name: LEVEMIR FLEXTOUCH 100 UNIT/ML SUBQ] 30 mL 0  ?  Sig: INJECT 40 UNITS UNDER THE SKIN IN THE MORNING AND 30 UNITS IN THE EVENING  ?  ? Endocrinology:  Diabetes - Insulins Passed - 08/03/2021  1:10 PM  ?  ?  Passed - HBA1C is between 0 and 7.9 and within 180 days  ?  Hemoglobin A1C  ?Date Value Ref Range Status  ?05/19/2021 6.2 (A) 4.0 - 5.6 % Final  ? ?Hgb A1c MFr Bld  ?Date Value Ref Range Status  ?01/15/2021 6.6 (H) 4.8 - 5.6 % Final  ?  Comment:  ?           Prediabetes: 5.7 - 6.4 ?         Diabetes: >6.4 ?         Glycemic control for adults with diabetes: <7.0 ?  ?   ?  ?  Passed - Valid encounter within last 6 months  ?  Recent Outpatient Visits   ?      ? 2 months ago Essential hypertension  ? [facility] Glean Hess, MD  ? 6 months ago Annual physical exam  ? [facility] Glean [facility], MD  ? 11 months ago Type II diabetes mellitus with complication [facility])  ? [facility] Glean Hess, MD  ? 1 year ago Type II diabetes mellitus with complication [facility])  ? [facility] Glean Hess, MD  ? 1 year ago Annual physical exam  ? [facility] Glean Hess, MD  ?  ?  ?Future Appointments   ?        ? In 1 month Army Melia Jesse Sans, MD [facility], [place]  ? In 5 months Army Melia Jesse Sans, MD [facility], [place]  ?  ? ?  ?  ?  ? ?

## 2021-08-10 ENCOUNTER — Other Ambulatory Visit: Payer: Self-pay | Admitting: Internal Medicine

## 2021-08-10 DIAGNOSIS — J449 Chronic obstructive pulmonary disease, unspecified: Secondary | ICD-10-CM

## 2021-08-11 NOTE — Telephone Encounter (Signed)
Requested medication (s) are due for refill today: yes ? ?Requested medication (s) are on the active medication list: yes ? ?Last refill:  05/19/21 ? ?Future visit scheduled: yes ? ?Notes to clinic:  Unable to refill per protocol, cannot delegate, there is not a protocol attached to refill request. ? ? ?  ?Requested Prescriptions  ?Pending Prescriptions Disp Refills  ? BREO ELLIPTA 200-25 MCG/ACT AEPB [Pharmacy Med Name: BREO ELLIPTA 200-25 MCG/ACT INH AEP] 60 each 1  ?  Sig: INHALE 1 INHALATION BY MOUTH DAILY.  ?  ? There is no refill protocol information for this order  ?  ? ? ?

## 2021-09-07 ENCOUNTER — Other Ambulatory Visit: Payer: Self-pay | Admitting: Internal Medicine

## 2021-09-07 DIAGNOSIS — I10 Essential (primary) hypertension: Secondary | ICD-10-CM

## 2021-09-08 NOTE — Telephone Encounter (Signed)
Requested medication (s) are due for refill today:   Yes for both ? ?Requested medication (s) are on the active medication list:   Yes for both ? ?Future visit scheduled:   Yes in 1 wk ? ? ?Last ordered: Maxzide 09/01/2020 #90, 3 refills;  Losartan 02/16/2021 #90, 1 refill ? ?Returned because a couple of labs are overdue per protocol.  ? ?Requested Prescriptions  ?Pending Prescriptions Disp Refills  ? triamterene-hydrochlorothiazide (MAXZIDE-25) 37.5-25 MG tablet [Pharmacy Med Name: TRIAMTERENE-HCTZ 37.5-25 MG TAB] 90 tablet 3  ?  Sig: Take 1 tablet by mouth daily.  ?  ? Cardiovascular: Diuretic Combos Failed - 09/07/2021  8:46 AM  ?  ?  Failed - K in normal range and within 180 days  ?  Potassium  ?Date Value Ref Range Status  ?01/15/2021 4.7 3.5 - 5.2 mmol/L Final  ?   ?  ?  Failed - Na in normal range and within 180 days  ?  Sodium  ?Date Value Ref Range Status  ?01/15/2021 140 134 - 144 mmol/L Final  ?   ?  ?  Failed - Cr in normal range and within 180 days  ?  Creatinine, Ser  ?Date Value Ref Range Status  ?01/15/2021 0.88 0.57 - 1.00 mg/dL Final  ?   ?  ?  Passed - Last BP in normal range  ?  BP Readings from Last 1 Encounters:  ?05/19/21 128/76  ?   ?  ?  Passed - Valid encounter within last 6 months  ?  Recent Outpatient Visits   ? ?      ? 3 months ago Essential hypertension  ? [facility] Glean Hess, MD  ? 7 months ago Annual physical exam  ? [facility] Glean Hess, MD  ? 1 year ago Type II diabetes mellitus with complication [facility])  ? [facility] Glean Hess, MD  ? 1 year ago Type II diabetes mellitus with complication [facility])  ? [facility] Glean Hess, MD  ? 1 year ago Annual physical exam  ? [facility] Glean Hess, MD  ? ?  ?  ?Future Appointments   ? ?        ? In 1 week Glean Hess, MD [facility], [place]  ? In 4 months Army Melia Jesse Sans, MD [facility], [facility]  ? ?  ? ? ?  ?  ?  ? losartan (COZAAR) 25 MG  tablet [Pharmacy Med Name: LOSARTAN POTASSIUM 25 MG TAB] 90 tablet 1  ?  Sig: TAKE 1 TABLET BY MOUTH DAILY  ?  ? Cardiovascular:  Angiotensin Receptor Blockers Failed - 09/07/2021  8:46 AM  ?  ?  Failed - Cr in normal range and within 180 days  ?  Creatinine, Ser  ?Date Value Ref Range Status  ?01/15/2021 0.88 0.57 - 1.00 mg/dL Final  ?   ?  ?  Failed - K in normal range and within 180 days  ?  Potassium  ?Date Value Ref Range Status  ?01/15/2021 4.7 3.5 - 5.2 mmol/L Final  ?   ?  ?  Passed - Patient is not pregnant  ?  ?  Passed - Last BP in normal range  ?  BP Readings from Last 1 Encounters:  ?05/19/21 128/76  ?   ?  ?  Passed - Valid encounter within last 6 months  ?  Recent Outpatient Visits   ? ?      ?  3 months ago Essential hypertension  ? [facility] Glean Hess, MD  ? 7 months ago Annual physical exam  ? [facility] Glean Hess, MD  ? 1 year ago Type II diabetes mellitus with complication [facility])  ? [facility] Glean Hess, MD  ? 1 year ago Type II diabetes mellitus with complication [facility])  ? [facility] Glean Hess, MD  ? 1 year ago Annual physical exam  ? [facility] Glean Hess, MD  ? ?  ?  ?Future Appointments   ? ?        ? In 1 week Glean Hess, MD [facility], [place]  ? In 4 months Army Melia Jesse Sans, MD [facility], [place]  ? ?  ? ? ?  ?  ?  ?Yes  ?

## 2021-09-11 ENCOUNTER — Other Ambulatory Visit: Payer: Self-pay | Admitting: Internal Medicine

## 2021-09-11 DIAGNOSIS — K209 Esophagitis, unspecified without bleeding: Secondary | ICD-10-CM

## 2021-09-11 NOTE — Telephone Encounter (Signed)
Requested Prescriptions  Pending Prescriptions Disp Refills  . omeprazole (PRILOSEC) 20 MG capsule [Pharmacy Med Name: OMEPRAZOLE DR 20 MG CAP] 90 capsule 0    Sig: TAKE 1 CAPSULE BY MOUTH DAILY.     Gastroenterology: Proton Pump Inhibitors Passed - 09/11/2021  9:37 AM      Passed - Valid encounter within last 12 months    Recent Outpatient Visits          3 months ago Essential hypertension   [facility] Glean Hess, MD   7 months ago Annual physical exam   [facility] Glean Hess, MD   1 year ago Type II diabetes mellitus with complication [facility])   [facility] Glean Hess, MD   1 year ago Type II diabetes mellitus with complication [facility])   [facility] Glean Hess, MD   1 year ago Annual physical exam   [facility] Glean [facility], MD      Future Appointments            In 5 days Glean Hess, MD [facility], [place]   In 4 months Army Melia Jesse Sans, MD [facility], [facility]

## 2021-09-16 ENCOUNTER — Ambulatory Visit (INDEPENDENT_AMBULATORY_CARE_PROVIDER_SITE_OTHER): Payer: HMO | Admitting: Internal Medicine

## 2021-09-16 ENCOUNTER — Encounter: Payer: Self-pay | Admitting: Internal Medicine

## 2021-09-16 VITALS — BP 124/72 | HR 74 | Ht 62.0 in | Wt 222.0 lb

## 2021-09-16 DIAGNOSIS — E118 Type 2 diabetes mellitus with unspecified complications: Secondary | ICD-10-CM | POA: Diagnosis not present

## 2021-09-16 DIAGNOSIS — I10 Essential (primary) hypertension: Secondary | ICD-10-CM | POA: Diagnosis not present

## 2021-09-16 DIAGNOSIS — I723 Aneurysm of iliac artery: Secondary | ICD-10-CM

## 2021-09-16 NOTE — Progress Notes (Signed)
Date:  09/16/2021   Name:  Molly Hoover   DOB:  20-Mar-1946   MRN:  [id number]   Chief Complaint: Hypertension and Diabetes Husband in hospice, status is steadily declining. Hypertension This is a chronic problem. The problem is controlled. Pertinent negatives include no chest pain, headaches, palpitations or shortness of breath. Past treatments include angiotensin blockers and diuretics. The current treatment provides significant improvement.  Diabetes She presents for her follow-up diabetic visit. She has type 2 diabetes mellitus. Her disease course has been stable. There are no hypoglycemic associated symptoms. Pertinent negatives for hypoglycemia include no headaches, nervousness/anxiousness or tremors. Pertinent negatives for diabetes include no chest pain, no fatigue, no polydipsia and no polyuria. Symptoms are stable. Current diabetic treatment includes intensive insulin program (Levemir, Novolog, Trulicity, metformin). Her weight is decreasing steadily. There is no change in her home blood glucose trend. An ACE inhibitor/angiotensin II receptor blocker is being taken. Eye exam is current.   Lab Results  Component Value Date   NA 140 01/15/2021   K 4.7 01/15/2021   CO2 20 01/15/2021   GLUCOSE 156 (H) 01/15/2021   BUN 20 01/15/2021   CREATININE 0.88 01/15/2021   CALCIUM 9.9 01/15/2021   EGFR 68 01/15/2021   GFRNONAA 76 01/02/2020   Lab Results  Component Value Date   CHOL 156 01/15/2021   HDL 55 01/15/2021   LDLCALC 85 01/15/2021   TRIG 86 01/15/2021   CHOLHDL 2.8 01/15/2021   Lab Results  Component Value Date   TSH 2.890 01/15/2021   Lab Results  Component Value Date   HGBA1C 6.2 (A) 05/19/2021   Lab Results  Component Value Date   WBC 4.8 01/15/2021   HGB 12.8 01/15/2021   HCT 38.7 01/15/2021   MCV 91 01/15/2021   PLT 201 01/15/2021   Lab Results  Component Value Date   ALT 14 01/15/2021   AST 22 01/15/2021   ALKPHOS 87 01/15/2021   BILITOT 0.5  01/15/2021   Lab Results  Component Value Date   VD25OH 70.1 01/02/2020     Review of Systems  Constitutional:  Negative for appetite change, fatigue, fever and unexpected weight change.  HENT:  Negative for tinnitus and trouble swallowing.   Eyes:  Negative for visual disturbance.  Respiratory:  Negative for cough, chest tightness and shortness of breath.   Cardiovascular:  Negative for chest pain, palpitations and leg swelling.  Gastrointestinal:  Negative for abdominal pain.  Endocrine: Negative for polydipsia and polyuria.  Genitourinary:  Negative for dysuria and hematuria.  Musculoskeletal:  Negative for arthralgias.  Neurological:  Negative for tremors, numbness and headaches.  Psychiatric/Behavioral:  Negative for dysphoric mood. The patient is not nervous/anxious.    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   Abdominal aortic ectasia ([place]) 12/11/2015   Osteoarthritis of right knee 12/10/2015   Arthritis of hand 10/06/2015   COPD, moderate ([place]) 11/19/2014   Edema extremities 11/19/2014   Esophagitis determined by endoscopy 11/19/2014   Hyperlipidemia associated with type 2 diabetes mellitus ([place]) 11/19/2014   Idiopathic osteoporosis 11/19/2014   Tobacco abuse, 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   Venous insufficiency of leg 11/19/2014   Type II diabetes mellitus with complication ([place]) 75/44/9201   Morbid obesity ([place]) 10/17/2013   Essential hypertension 10/17/2013   Chronic  cough 10/17/2013    Allergies  Allergen Reactions   Codeine Hives and Itching   Combivent [Ipratropium-Albuterol] Itching   Ivp Dye [Iodinated Contrast Media] Hives and Itching   Meloxicam     Past Surgical History:  Procedure Laterality Date   BREAST BIOPSY     CARPAL TUNNEL RELEASE     right    CERVICAL FUSION  2002   CHOLECYSTECTOMY     ESOPHAGOGASTRODUODENOSCOPY  03/2014   benign stricture dilated   HERNIA REPAIR     LUMBAR FUSION     L1-4    REPLACEMENT TOTAL KNEE Left 12/22/208   TOTAL ABDOMINAL HYSTERECTOMY      Social History   Tobacco Use   Smoking status: Former    Packs/day: 2.00    Years: 15.00    Pack years: 30.00    Types: Cigarettes    Quit date: 1989    Years since quitting: 34.4   Smokeless tobacco: Never   Tobacco comments:    smoking cessation materials not required  Vaping Use   Vaping Use: Never used  Substance Use Topics   Alcohol use: Yes    Comment: rare   Drug use: No     Medication list has been reviewed and updated.  Current Meds  Medication Sig   Albuterol Sulfate (PROAIR RESPICLICK) 902 (90 Base) MCG/ACT AEPB Inhale 1-2 puffs into the lungs every 4 (four) hours as needed.   alendronate (FOSAMAX) 70 MG tablet TAKE 1 TABLET EVERY WEEK   atorvastatin (LIPITOR) 20 MG tablet TAKE 1 TABLET(20 MG) BY MOUTH AT BEDTIME   BREO ELLIPTA 200-25 MCG/ACT AEPB INHALE 1 INHALATION BY MOUTH DAILY.   Cranberry 500 MG CAPS Take 500 mg by mouth daily.    Dulaglutide (TRULICITY) 1.5 IO/9.7DZ SOPN ADMINISTER 1.5 MG UNDER THE SKIN 1 TIME WEEKLY   gemfibrozil (LOPID) 600 MG tablet TAKE 1 TABLET(600 MG) BY MOUTH TWICE DAILY   glucose blood (ONE TOUCH ULTRA TEST) test strip Use 2 times per day to test blood sugar.   insulin aspart (NOVOLOG) 100 UNIT/ML FlexPen Inject 25 Units into the skin 3 (three) times daily with meals.   insulin detemir (LEVEMIR FLEXTOUCH) 100 UNIT/ML FlexPen INJECT 40 UNITS UNDER THE SKIN IN THE MORNING AND 30 UNITS IN THE EVENING   Insulin Pen Needle (B-D UF III MINI PEN NEEDLES) 31G X 5 MM MISC USE AND DISCARD 1 PEN      NEEDLE 4 TIMES A DAY   losartan (COZAAR) 25 MG tablet TAKE 1 TABLET BY MOUTH DAILY   metFORMIN (GLUCOPHAGE) 500 MG tablet TAKE 1 TABLET(500 MG) BY MOUTH TWICE DAILY   omeprazole (PRILOSEC) 20 MG capsule TAKE 1  CAPSULE BY MOUTH DAILY.   OneTouch Delica Lancets 32D MISC 1 each by Does not apply route 2 (two) times daily.   triamterene-hydrochlorothiazide (MAXZIDE-25) 37.5-25 MG tablet TAKE 1 TABLET BY MOUTH DAILY   Vitamin D, Ergocalciferol, (DRISDOL) 1.25 MG (50000 UNIT) CAPS capsule TAKE ONE CAPSULE TWICE A WEEK   Current Facility-Administered Medications for the 09/16/21 encounter (Office Visit) with Glean Hess, MD  Medication   albuterol (PROVENTIL) (2.5 MG/3ML) 0.083% nebulizer solution 2.5 mg       09/16/2021   10:12 AM 05/19/2021   10:20 AM 01/15/2021   10:27 AM 09/01/2020    9:46 AM  GAD 7 : Generalized Anxiety Score  Nervous, Anxious, on Edge 0 0 0 0  Control/stop worrying 0 0 0 0  Worry too much - different things  0 2 0 0  Trouble relaxing 0 0 0 0  Restless 0 0 0 0  Easily annoyed or irritable 0 0 0 0  Afraid - awful might happen 0 0 0 0  Total GAD 7 Score 0 2 0 0  Anxiety Difficulty Not difficult at all Somewhat difficult         09/16/2021   10:11 AM  Depression screen PHQ 2/9  Decreased Interest 0  Down, Depressed, Hopeless 0  PHQ - 2 Score 0  Altered sleeping 0  Tired, decreased energy 2  Change in appetite 0  Feeling bad or failure about yourself  0  Trouble concentrating 0  Moving slowly or fidgety/restless 0  Suicidal thoughts 0  PHQ-9 Score 2  Difficult doing work/chores Not difficult at all    BP Readings from Last 3 Encounters:  09/16/21 124/72  05/19/21 128/76  01/15/21 122/72    Physical Exam Vitals and nursing note reviewed.  Constitutional:      General: She is not in acute distress.    Appearance: She is well-developed. She is obese.  HENT:     Head: Normocephalic and atraumatic.  Cardiovascular:     Rate and Rhythm: Normal rate and regular rhythm.     Pulses: Normal pulses.     Heart sounds: No murmur heard. Pulmonary:     Effort: Pulmonary effort is normal. No respiratory distress.     Breath sounds: No wheezing or rhonchi.   Musculoskeletal:     Cervical back: Normal range of motion.     Right lower leg: No edema.     Left lower leg: No edema.  Lymphadenopathy:     Cervical: No cervical adenopathy.  Skin:    General: Skin is warm and dry.     Findings: No rash.  Neurological:     General: No focal deficit present.     Mental Status: She is alert and oriented to person, place, and time.  Psychiatric:        Mood and Affect: Mood normal.        Behavior: Behavior normal.    Wt Readings from Last 3 Encounters:  09/16/21 222 lb (100.7 kg)  05/19/21 226 lb 12.8 oz (102.9 kg)  01/15/21 246 lb (111.6 kg)    BP 124/72   Pulse 74   Ht 5' 2"  (1.575 m)   Wt 222 lb (100.7 kg)   SpO2 98%   BMI 40.60 kg/m   Assessment and Plan: 1. Type II diabetes mellitus with complication (HCC) Clinically stable by exam and report without s/s of hypoglycemia. DM complicated by hypertension and dyslipidemia. Tolerating medications well without side effects or other concerns. - Basic metabolic panel - Hemoglobin A1c  2. Essential hypertension Clinically stable exam with well controlled BP. Tolerating medications without side effects at this time. Pt to continue current regimen and low sodium diet; benefits of regular exercise as able discussed.  3. Iliac artery aneurysm ([place]) Ectasia noted in 2017 - recommended repeat US in 5 yrs Last seen by VS in 2019 recommended annual follow up Will discuss at next visit  4. Morbid obesity ([place]) Weight is decreasing steadily - 25 lbs over the past 8 months     Partially dictated using Editor, commissioning. Any errors are unintentional.  Halina Maidens, MD [facility]  09/16/2021

## 2021-09-17 LAB — BASIC METABOLIC PANEL
BUN/Creatinine Ratio: 26 (ref 12–28)
BUN: 22 mg/dL (ref 8–27)
CO2: 21 mmol/L (ref 20–29)
Calcium: 9.7 mg/dL (ref 8.7–10.3)
Chloride: 100 mmol/L (ref 96–106)
Creatinine, Ser: 0.86 mg/dL (ref 0.57–1.00)
Glucose: 130 mg/dL — ABNORMAL HIGH (ref 70–99)
Potassium: 4.9 mmol/L (ref 3.5–5.2)
Sodium: 138 mmol/L (ref 134–144)
eGFR: 70 mL/min/{1.73_m2} (ref >59)

## 2021-09-17 LAB — HEMOGLOBIN A1C
Est. average glucose Bld gHb Est-mCnc: 137 mg/dL
Hgb A1c MFr Bld: 6.4 % — ABNORMAL HIGH (ref 4.8–5.6)

## 2021-10-15 ENCOUNTER — Other Ambulatory Visit: Payer: Self-pay | Admitting: Internal Medicine

## 2021-10-15 DIAGNOSIS — J449 Chronic obstructive pulmonary disease, unspecified: Secondary | ICD-10-CM

## 2021-10-15 NOTE — Telephone Encounter (Signed)
Requested medication (s) are due for refill today - yes  Requested medication (s) are on the active medication list -yes  Future visit scheduled -yes  Last refill: 08/11/21 #60 1RF  Notes to clinic: no protocol assigned to medication- provider review   Requested Prescriptions  Pending Prescriptions Disp Refills   BREO ELLIPTA 200-25 MCG/ACT AEPB [Pharmacy Med Name: BREO ELLIPTA 200-25 MCG/ACT INH AEP] 60 each 1    Sig: INHALE 1 INHALATION BY MOUTH DAILY.     There is no refill protocol information for this order       Requested Prescriptions  Pending Prescriptions Disp Refills   BREO ELLIPTA 200-25 MCG/ACT AEPB [Pharmacy Med Name: BREO ELLIPTA 200-25 MCG/ACT INH AEP] 60 each 1    Sig: INHALE 1 INHALATION BY MOUTH DAILY.     There is no refill protocol information for this order

## 2021-11-07 ENCOUNTER — Other Ambulatory Visit: Payer: Self-pay | Admitting: Internal Medicine

## 2021-11-07 DIAGNOSIS — E118 Type 2 diabetes mellitus with unspecified complications: Secondary | ICD-10-CM

## 2021-11-09 NOTE — Telephone Encounter (Signed)
Requested Prescriptions  Pending Prescriptions Disp Refills  . insulin detemir (LEVEMIR FLEXPEN) 100 UNIT/ML FlexPen [Pharmacy Med Name: LEVEMIR FLEXPEN 100 UNIT/ML SUBQ SO] 30 mL 0    Sig: INJECT 40 UNITS UNDER THE SKIN IN THE MORNING AND 30 UNITS IN THE EVENING     Endocrinology:  Diabetes - Insulins Passed - 11/07/2021  9:13 AM      Passed - HBA1C is between 0 and 7.9 and within 180 days    Hgb A1c MFr Bld  Date Value Ref Range Status  09/16/2021 6.4 (H) 4.8 - 5.6 % Final    Comment:             Prediabetes: 5.7 - 6.4          Diabetes: >6.4          Glycemic control for adults with diabetes: <7.0          Passed - Valid encounter within last 6 months    Recent Outpatient Visits          1 month ago Type II diabetes mellitus with complication [facility])   [facility] Glean Hess, MD   5 months ago Essential hypertension   [facility] Glean Hess, MD   9 months ago Annual physical exam   [facility] Glean Hess, MD   1 year ago Type II diabetes mellitus with complication [facility])   [facility] Glean Hess, MD   1 year ago Type II diabetes mellitus with complication [facility])   [facility] Glean Hess, MD      Future Appointments            In 2 months Army Melia Jesse Sans, MD [facility], [facility]

## 2021-11-19 ENCOUNTER — Other Ambulatory Visit: Payer: Self-pay | Admitting: Internal Medicine

## 2021-11-19 DIAGNOSIS — J449 Chronic obstructive pulmonary disease, unspecified: Secondary | ICD-10-CM

## 2021-11-20 NOTE — Telephone Encounter (Signed)
Requested Prescriptions  Pending Prescriptions Disp Refills  . BREO ELLIPTA 200-25 MCG/ACT AEPB [Pharmacy Med Name: BREO ELLIPTA 200-25 MCG/ACT INH AEP] 60 each 1    Sig: INHALE 1 INHALATION BY MOUTH DAILY.     Pulmonology:  Combination Products Passed - 11/19/2021 11:19 AM      Passed - Valid encounter within last 12 months    Recent Outpatient Visits          2 months ago Type II diabetes mellitus with complication [facility])   [facility] Glean Hess, MD   6 months ago Essential hypertension   [facility] Glean Hess, MD   10 months ago Annual physical exam   [facility] Glean Hess, MD   1 year ago Type II diabetes mellitus with complication [facility])   [facility] Glean Hess, MD   1 year ago Type II diabetes mellitus with complication [facility])   [facility] Glean Hess, MD      Future Appointments            In 2 months Army Melia Jesse Sans, MD [facility], [facility]

## 2021-11-27 ENCOUNTER — Other Ambulatory Visit: Payer: Self-pay | Admitting: Internal Medicine

## 2021-11-27 DIAGNOSIS — E118 Type 2 diabetes mellitus with unspecified complications: Secondary | ICD-10-CM

## 2021-11-27 DIAGNOSIS — I10 Essential (primary) hypertension: Secondary | ICD-10-CM

## 2021-11-27 DIAGNOSIS — K209 Esophagitis, unspecified without bleeding: Secondary | ICD-10-CM

## 2021-11-27 NOTE — Telephone Encounter (Signed)
Requested Prescriptions  Pending Prescriptions Disp Refills  . metFORMIN (GLUCOPHAGE) 500 MG tablet [Pharmacy Med Name: METFORMIN HCL 500 MG TAB] 180 tablet 1    Sig: TAKE 1 TABLET BY MOUTH TWICE DAILY.     Endocrinology:  Diabetes - Biguanides Failed - 11/27/2021 12:19 PM      Failed - B12 Level in normal range and within 720 days    No results found for: "VITAMINB12"       Passed - Cr in normal range and within 360 days    Creatinine, Ser  Date Value Ref Range Status  09/16/2021 0.86 0.57 - 1.00 mg/dL Final         Passed - HBA1C is between 0 and 7.9 and within 180 days    Hgb A1c MFr Bld  Date Value Ref Range Status  09/16/2021 6.4 (H) 4.8 - 5.6 % Final    Comment:             Prediabetes: 5.7 - 6.4          Diabetes: >6.4          Glycemic control for adults with diabetes: <7.0          Passed - eGFR in normal range and within 360 days    GFR calc Af Amer  Date Value Ref Range Status  01/02/2020 88 >59 mL/min/1.73 Final    Comment:    **[facility] currently reports eGFR in compliance with the current**   recommendations of the [employer]. [facility] will   update reporting as new guidelines are published from the NKF-ASN   Task force.    GFR calc non Af Amer  Date Value Ref Range Status  01/02/2020 76 >59 mL/min/1.73 Final   eGFR  Date Value Ref Range Status  09/16/2021 70 >59 mL/min/1.73 Final         Passed - Valid encounter within last 6 months    Recent Outpatient Visits          2 months ago Type II diabetes mellitus with complication [facility])   [facility]   6 months ago Essential hypertension   [facility]   10 months ago Annual physical exam   [facility] Glean Hess, MD   1 year ago Type II diabetes mellitus with complication [facility])   [facility]   1 year ago Type II diabetes mellitus with complication [facility])   [facility]  Glean Hess, MD      Future Appointments            In 2 months Glean Hess, MD [facility] [place] within normal limits and completed in the last 12 months    WBC  Date Value Ref Range Status  01/15/2021 4.8 3.4 - 10.8 x10E3/uL Final  12/24/2014 7.2 3.6 - 11.0 K/uL Final   RBC  Date Value Ref Range Status  01/15/2021 4.27 3.77 - 5.28 x10E6/uL Final  03/15/2016 0 (A) 4.04 - 5.48 M/uL Final  12/24/2014 4.52 3.80 - 5.20 MIL/uL Final   Hemoglobin  Date Value Ref Range Status  01/15/2021 12.8 11.1 - 15.9 g/dL Final   Hematocrit  Date Value Ref Range Status  01/15/2021 38.7 34.0 - 46.6 % Final   MCHC  Date Value Ref Range Status  01/15/2021 33.1 31.5 - 35.7  g/dL Final  12/24/2014 32.7 32.0 - 36.0 g/dL Final   [facility]  Date Value Ref Range Status  01/15/2021 30.0 26.6 - 33.0 pg Final  12/24/2014 28.4 26.0 - 34.0 pg Final   MCV  Date Value Ref Range Status  01/15/2021 91 79 - 97 fL Final   No results found for: "PLTCOUNTKUC", "LABPLAT", "POCPLA" RDW  Date Value Ref Range Status  01/15/2021 13.0 11.7 - 15.4 % Final         . gemfibrozil (LOPID) 600 MG tablet [Pharmacy Med Name: GEMFIBROZIL 600 MG TAB] 180 tablet 3    Sig: TAKE 1 TABLET BY MOUTH TWICE DAILY     Cardiovascular:  Antilipid - Fibric Acid Derivatives Failed - 11/27/2021 12:19 PM      Failed - Lipid Panel in normal range within the last 12 months    Cholesterol, Total  Date Value Ref Range Status  01/15/2021 156 100 - 199 mg/dL Final   LDL Chol Calc (NIH)  Date Value Ref Range Status  01/15/2021 85 0 - 99 mg/dL Final   HDL  Date Value Ref Range Status  01/15/2021 55 >39 mg/dL Final   Triglycerides  Date Value Ref Range Status  01/15/2021 86 0 - 149 mg/dL Final         Passed - ALT in normal range and within 360 days    ALT  Date Value Ref Range Status  01/15/2021 14 0 - 32 IU/L Final         Passed - AST in normal range and within 360 days    AST   Date Value Ref Range Status  01/15/2021 22 0 - 40 IU/L Final         Passed - Cr in normal range and within 360 days    Creatinine, Ser  Date Value Ref Range Status  09/16/2021 0.86 0.57 - 1.00 mg/dL Final         Passed - HGB in normal range and within 360 days    Hemoglobin  Date Value Ref Range Status  01/15/2021 12.8 11.1 - 15.9 g/dL Final         Passed - HCT in normal range and within 360 days    Hematocrit  Date Value Ref Range Status  01/15/2021 38.7 34.0 - 46.6 % Final         Passed - PLT in normal range and within 360 days    Platelets  Date Value Ref Range Status  01/15/2021 201 150 - 450 x10E3/uL Final         Passed - WBC in normal range and within 360 days    WBC  Date Value Ref Range Status  01/15/2021 4.8 3.4 - 10.8 x10E3/uL Final  12/24/2014 7.2 3.6 - 11.0 K/uL Final         Passed - eGFR is 30 or above and within 360 days    GFR calc Af Amer  Date Value Ref Range Status  01/02/2020 88 >59 mL/min/1.73 Final    Comment:    **[facility] currently reports eGFR in compliance with the current**   recommendations of the [employer]. [facility] will   update reporting as new guidelines are published from the NKF-ASN   Task force.    GFR calc non Af Amer  Date Value Ref Range Status  01/02/2020 76 >59 mL/min/1.73 Final   eGFR  Date Value Ref Range Status  09/16/2021 70 >59 mL/min/1.73 Final  Passed - Valid encounter within last 12 months    Recent Outpatient Visits          2 months ago Type II diabetes mellitus with complication [facility])   [facility]   6 months ago Essential hypertension   [facility]   10 months ago Annual physical exam   [facility] Gle[facility], MD   1 year ago Type II diabetes mellitus with complication [facility])   [facility]   1 year ago Type II diabetes mellitus with complication [facility])   [facility] Gle[facility], MD      Future Appointments            In 2 months Glean Hess, MD [facility] [place] ([id number]) 37.5-25 MG tablet [Pharmacy Med Name: TRIAMTERENE-HCTZ 37.5-25 MG TAB] 90 tablet 0    Sig: TAKE 1 TABLET BY MOUTH DAILY     Cardiovascular: Diuretic Combos Passed - 11/27/2021 12:19 PM      Passed - K in normal range and within 180 days    Potassium  Date Value Ref Range Status  09/16/2021 4.9 3.5 - 5.2 mmol/L Final         Passed - Na in normal range and within 180 days    Sodium  Date Value Ref Range Status  09/16/2021 138 134 - 144 mmol/L Final         Passed - Cr in normal range and within 180 days    Creatinine, Ser  Date Value Ref Range Status  09/16/2021 0.86 0.57 - 1.00 mg/dL Final         Passed - Last BP in normal range    BP Readings from Last 1 Encounters:  09/16/21 124/72         Passed - Valid encounter within last 6 months    Recent Outpatient Visits          2 months ago Type II diabetes mellitus with complication [facility])   [facility]   6 months ago Essential hypertension   [facility]   10 months ago Annual physical exam   [facility], [facility]   1 year ago Type II diabetes mellitus with complication [facility])   [facility]   1 year ago Type II diabetes mellitus with complication [facility])   [facility], [facility]      Future Appointments            In 2 months Glean Hess, MD [facility]           . losartan (COZAAR) 25 MG tablet [Pharmacy Med Name: LOSARTAN POTASSIUM 25 MG TAB] 90 tablet 0    Sig: TAKE 1 TABLET BY MOUTH DAILY     Cardiovascular:  Angiotensin Receptor Blockers Passed - 11/27/2021 12:19 PM      Passed - Cr in normal range and within 180 days    Creatinine, Ser  Date Value Ref Range  Status  09/16/2021 0.86 0.57 - 1.00 mg/dL Final         Passed - K in normal range and within 180 days    Potassium  Date Value Ref Range Status  09/16/2021 4.9 3.5 -  5.2 mmol/L Final         Passed - Patient is not pregnant      Passed - Last BP in normal range    BP Readings from Last 1 Encounters:  09/16/21 124/72         Passed - Valid encounter within last 6 months    Recent Outpatient Visits          2 months ago Type II diabetes mellitus with complication [facility])   [facility]   6 months ago Essential hypertension   [facility]   10 months ago Annual physical exam   [facility], [facility]   1 year ago Type II diabetes mellitus with complication [facility])   [facility]   1 year ago Type II diabetes mellitus with complication [facility])   [facility] Gle[facility], [facility]      Future Appointments            In 2 months Army Melia Jesse Sans, MD [facility], [facility]

## 2021-11-27 NOTE — Telephone Encounter (Signed)
Requested Prescriptions  Pending Prescriptions Disp Refills  . omeprazole (PRILOSEC) 20 MG capsule [Pharmacy Med Name: OMEPRAZOLE DR 20 MG CAP] 90 capsule 0    Sig: TAKE 1 CAPSULE BY MOUTH DAILY.     Gastroenterology: Proton Pump Inhibitors Passed - 11/27/2021 12:20 PM      Passed - Valid encounter within last 12 months    Recent Outpatient Visits          2 months ago Type II diabetes mellitus with complication [facility])   [facility]   6 months ago Essential hypertension   [facility]   10 months ago Annual physical exam   [facility] Gle[facility] Hess, MD   1 year ago Type II diabetes mellitus with complication [facility])   [facility]   1 year ago Type II diabetes mellitus with complication [facility])   [facility] Glean Hess, MD      Future Appointments            In 2 months Army Melia Jesse Sans, MD [facility], [facility]

## 2021-12-10 ENCOUNTER — Other Ambulatory Visit: Payer: Self-pay | Admitting: Internal Medicine

## 2021-12-10 DIAGNOSIS — I10 Essential (primary) hypertension: Secondary | ICD-10-CM

## 2021-12-11 NOTE — Telephone Encounter (Signed)
Requested medication (s) are due for refill today:   Yes for both  Requested medication (s) are on the active medication list:   Yes for both  Future visit scheduled:   Yes   Last ordered: Fosamax 09/01/2020 #12, 4 refills;  Atorvastatin 02/16/2021 #90, 3 refills  Returned because labs and DEXA scan due   Requested Prescriptions  Pending Prescriptions Disp Refills   alendronate (FOSAMAX) 70 MG tablet [Pharmacy Med Name: ALENDRONATE SODIUM 70 MG TAB] 12 tablet 4    Sig: TAKE ONE TABLET EACH WEEK     Endocrinology:  Bisphosphonates Failed - 12/10/2021  8:49 AM      Failed - Vitamin D in normal range and within 360 days    Vit D, 25-Hydroxy  Date Value Ref Range Status  01/02/2020 70.1 30.0 - 100.0 ng/mL Final    Comment:    Vitamin D deficiency has been defined by the [place] practice guideline as a level of serum 25-OH vitamin D less than 20 ng/mL (1,2). The Endocrine Society went on to further define vitamin D insufficiency as a level between 21 and 29 ng/mL (2). 1. IOM (Institute of Medicine). 2010. Dietary reference    intakes for calcium and D. [place]: The    [employer]. 2. Holick MF, Binkley [place], Bischoff-Ferrari HA, et al.    Evaluation, treatment, and prevention of vitamin D    deficiency: an Endocrine Society clinical practice    guideline. JCEM. 2011 Jul; 96(7):1911-30.          Failed - Mg Level in normal range and within 360 days    No results found for: "MG"       Failed - Phosphate in normal range and within 360 days    Phosphorus  Date Value Ref Range Status  03/01/2016 3.7 2.5 - 4.5 mg/dL Final         Failed - Bone Mineral Density or Dexa Scan completed in the last 2 years      Passed - Ca in normal range and within 360 days    Calcium  Date Value Ref Range Status  09/16/2021 9.7 8.7 - 10.3 mg/dL Final         Passed - Cr in normal range and within 360 days    Creatinine, Ser  Date Value  Ref Range Status  09/16/2021 0.86 0.57 - 1.00 mg/dL Final         Passed - eGFR is 30 or above and within 360 days    GFR calc Af Amer  Date Value Ref Range Status  01/02/2020 88 >59 mL/min/1.73 Final    Comment:    **[facility] currently reports eGFR in compliance with the current**   recommendations of the [employer]. [facility] will   update reporting as new guidelines are published from the NKF-ASN   Task force.    GFR calc non Af Amer  Date Value Ref Range Status  01/02/2020 76 >59 mL/min/1.73 Final   eGFR  Date Value Ref Range Status  09/16/2021 70 >59 mL/min/1.73 Final         Passed - Valid encounter within last 12 months    Recent Outpatient Visits           2 months ago Type II diabetes mellitus with complication [facility])   [place] [facility] at [facility], Jesse Sans, MD   6 months ago Essential hypertension   [place]  Primary Care and Sports Medicine at [facility], Jesse Sans, MD   11 months ago Annual physical exam   [facility] and [facility] at [facility], Jesse Sans, MD   1 year ago Type II diabetes mellitus with complication [facility])   [place] Primary Care and Sports Medicine at [facility], Jesse Sans, MD   1 year ago Type II diabetes mellitus with complication [facility])   [place] Primary Care and Sports Medicine at [facility], Jesse Sans, MD       Future Appointments             In 1 month Glean Hess, MD [facility] Primary Care and Sports Medicine at [facility], [place]             atorvastatin (LIPITOR) 20 MG tablet [Pharmacy Med Name: ATORVASTATIN CALCIUM 20 MG TAB] 90 tablet 3    Sig: TAKE 1 TABLET BY MOUTH AT BEDTIME     Cardiovascular:  Antilipid - Statins Failed - 12/10/2021  8:49 AM      Failed - Lipid Panel in normal range within the last 12 months    Cholesterol, Total  Date Value Ref Range Status  01/15/2021  156 100 - 199 mg/dL Final   LDL Chol Calc (NIH)  Date Value Ref Range Status  01/15/2021 85 0 - 99 mg/dL Final   HDL  Date Value Ref Range Status  01/15/2021 55 >39 mg/dL Final   Triglycerides  Date Value Ref Range Status  01/15/2021 86 0 - 149 mg/dL Final         Passed - Patient is not pregnant      Passed - Valid encounter within last 12 months    Recent Outpatient Visits           2 months ago Type II diabetes mellitus with complication ([place])   [place] Primary Care and Sports Medicine at [facility], Jesse Sans, MD   6 months ago Essential hypertension   [place] Primary Care and Sports Medicine at [facility], Jesse Sans, MD   11 months ago Annual physical exam    Primary Care and Sports Medicine at [facility], Jesse Sans, MD   1 year ago Type II diabetes mellitus with complication [facility])    Primary Care and Sports Medicine at [facility], Jesse Sans, MD   1 year ago Type II diabetes mellitus with complication [facility])    Primary Care and Sports Medicine at [facility], Jesse Sans, MD       Future Appointments             In 1 month Army Melia, Jesse Sans, MD [place] and Sports Medicine at [facility], [facility]

## 2022-01-19 ENCOUNTER — Encounter: Payer: HMO | Admitting: Internal Medicine

## 2022-01-25 ENCOUNTER — Other Ambulatory Visit: Payer: Self-pay | Admitting: Internal Medicine

## 2022-01-25 DIAGNOSIS — E118 Type 2 diabetes mellitus with unspecified complications: Secondary | ICD-10-CM

## 2022-01-26 ENCOUNTER — Other Ambulatory Visit: Payer: Self-pay | Admitting: Internal Medicine

## 2022-01-26 DIAGNOSIS — J449 Chronic obstructive pulmonary disease, unspecified: Secondary | ICD-10-CM

## 2022-01-26 NOTE — Telephone Encounter (Signed)
Requested Prescriptions  Pending Prescriptions Disp Refills  . Dulaglutide (TRULICITY) 1.5 JG/2.8ZM SOPN [Pharmacy Med Name: TRULICITY 1.5 OQ/9.4TM SUBQ SOLN ML] 6 mL 1    Sig: ADMINISTER 1.5 MG SUBCUTANEOUSLY ONCE A WEEK     Endocrinology:  Diabetes - GLP-1 Receptor Agonists Passed - 01/25/2022  9:26 AM      Passed - HBA1C is between 0 and 7.9 and within 180 days    Hgb A1c MFr Bld  Date Value Ref Range Status  09/16/2021 6.4 (H) 4.8 - 5.6 % Final    Comment:             Prediabetes: 5.7 - 6.4          Diabetes: >6.4          Glycemic control for adults with diabetes: <7.0          Passed - Valid encounter within last 6 months    Recent Outpatient Visits          4 months ago Type II diabetes mellitus with complication [facility])   [employer] Primary Care and Sports Medicine at [facility], Jesse Sans, MD   8 months ago Essential hypertension   [place] Primary Care and Sports Medicine at [facility], Jesse Sans, MD   1 year ago Annual physical exam   [place] Primary Care and Sports Medicine at [facility], Jesse Sans, MD   1 year ago Type II diabetes mellitus with complication [facility])   [place] Primary Care and Sports Medicine at [facility], Jesse Sans, MD   1 year ago Type II diabetes mellitus with complication [facility])   [place] Primary Care and Sports Medicine at [facility], Jesse Sans, MD      Future Appointments            In 3 days Army Melia Jesse Sans, MD [facility] Primary Care and Sports Medicine at [facility], [facility]           . LEVEMIR FLEXPEN 100 UNIT/ML FlexPen [Pharmacy Med Name: LEVEMIR FLEXPEN 100 UNIT/ML SUBQ SO] 30 mL 0    Sig: INJECT 40 UNITS UNDER THE SKIN IN THE MORNING AND 30 UNITS IN THE EVENING     Endocrinology:  Diabetes - Insulins Passed - 01/25/2022  9:26 AM      Passed - HBA1C is between 0 and 7.9 and within 180 days    Hgb A1c MFr Bld  Date Value Ref Range Status  09/16/2021  6.4 (H) 4.8 - 5.6 % Final    Comment:             Prediabetes: 5.7 - 6.4          Diabetes: >6.4          Glycemic control for adults with diabetes: <7.0          Passed - Valid encounter within last 6 months    Recent Outpatient Visits          4 months ago Type II diabetes mellitus with complication [facility])   [place] Primary Care and Sports Medicine at [facility], Jesse Sans, MD   8 months ago Essential hypertension    Primary Care and Sports Medicine at [facility], Jesse Sans, MD   1 year ago Annual physical exam   [facility] and [facility] at [facility], Jesse Sans, MD   1 year ago Type II diabetes mellitus  with complication [facility])   [place] Primary Care and Sports Medicine at [facility], Jesse Sans, MD   1 year ago Type II diabetes mellitus with complication [facility])   [place] Primary Care and Sports Medicine at [facility], Jesse Sans, MD      Future Appointments            In 3 days Army Melia Jesse Sans, MD [facility] Primary [facility] and Sports Medicine at [facility], [facility]

## 2022-01-26 NOTE — Telephone Encounter (Signed)
Requested Prescriptions  Pending Prescriptions Disp Refills  . BREO ELLIPTA 200-25 MCG/ACT AEPB [Pharmacy Med Name: BREO ELLIPTA 200-25 MCG/ACT INH AEP] 60 each 0    Sig: INHALE 1 INHALATION BY MOUTH DAILY.     Pulmonology:  Combination Products Passed - 01/26/2022  9:04 AM      Passed - Valid encounter within last 12 months    Recent Outpatient Visits          4 months ago Type II diabetes mellitus with complication [facility])   [place] Primary Care and Sports Medicine at [facility], Jesse Sans, MD   8 months ago Essential hypertension   [place] Primary Care and Sports Medicine at [facility], Jesse Sans, MD   1 year ago Annual physical exam   [place] Primary Care and Sports Medicine at [facility], Jesse Sans, MD   1 year ago Type II diabetes mellitus with complication [facility])   [place] Primary Care and Sports Medicine at [facility], Jesse Sans, MD   1 year ago Type II diabetes mellitus with complication [facility])   [place] Primary Care and Sports Medicine at [facility], Jesse Sans, MD      Future Appointments            In 3 days Army Melia Jesse Sans, MD [place] and Sports Medicine at [facility], [facility]

## 2022-01-29 ENCOUNTER — Telehealth: Payer: Self-pay | Admitting: Internal Medicine

## 2022-01-29 ENCOUNTER — Ambulatory Visit (INDEPENDENT_AMBULATORY_CARE_PROVIDER_SITE_OTHER): Payer: HMO | Admitting: Internal Medicine

## 2022-01-29 ENCOUNTER — Encounter: Payer: Self-pay | Admitting: Internal Medicine

## 2022-01-29 VITALS — BP 128/78 | HR 73 | Ht 62.0 in | Wt 227.0 lb

## 2022-01-29 DIAGNOSIS — J449 Chronic obstructive pulmonary disease, unspecified: Secondary | ICD-10-CM

## 2022-01-29 DIAGNOSIS — Z1231 Encounter for screening mammogram for malignant neoplasm of breast: Secondary | ICD-10-CM

## 2022-01-29 DIAGNOSIS — E118 Type 2 diabetes mellitus with unspecified complications: Secondary | ICD-10-CM | POA: Diagnosis not present

## 2022-01-29 DIAGNOSIS — Z23 Encounter for immunization: Secondary | ICD-10-CM

## 2022-01-29 DIAGNOSIS — I723 Aneurysm of iliac artery: Secondary | ICD-10-CM

## 2022-01-29 DIAGNOSIS — E785 Hyperlipidemia, unspecified: Secondary | ICD-10-CM | POA: Diagnosis not present

## 2022-01-29 DIAGNOSIS — I10 Essential (primary) hypertension: Secondary | ICD-10-CM | POA: Diagnosis not present

## 2022-01-29 DIAGNOSIS — E1169 Type 2 diabetes mellitus with other specified complication: Secondary | ICD-10-CM

## 2022-01-29 DIAGNOSIS — M818 Other osteoporosis without current pathological fracture: Secondary | ICD-10-CM

## 2022-01-29 DIAGNOSIS — Z0000 Encounter for general adult medical examination without abnormal findings: Secondary | ICD-10-CM | POA: Diagnosis not present

## 2022-01-29 LAB — POCT URINALYSIS DIPSTICK
Bilirubin, UA: NEGATIVE
Blood, UA: NEGATIVE
Glucose, UA: NEGATIVE
Ketones, UA: NEGATIVE
Leukocytes, UA: NEGATIVE
Nitrite, UA: NEGATIVE
Protein, UA: NEGATIVE
Spec Grav, UA: 1.01 (ref 1.010–1.025)
Urobilinogen, UA: 0.2 E.U./dL
pH, UA: 5 (ref 5.0–8.0)

## 2022-01-29 MED ORDER — BD PEN NEEDLE MINI U/F 31G X 5 MM MISC: 3 refills | Status: AC

## 2022-01-29 NOTE — Patient Instructions (Addendum)
Call [facility] to schedule your mammogram and Bone Density at [phone].  Reschedule your follow up with Dr. Lucky Cowboy.

## 2022-01-29 NOTE — Progress Notes (Signed)
Date:  01/29/2022   Name:  Molly Hoover   DOB:  [birth year]   MRN:  [id number]   Chief Complaint: Annual Exam Molly Hoover is a 76 y.o. female who presents today for her Complete Annual Exam. She feels poorly. She reports exercising- none. She reports she is sleeping fairly well. Breast complaints - none.  Mammogram: 01/2021 DEXA: ? None in chart Colonoscopy: 2013  Health Maintenance Due  Topic Date Due   Zoster Vaccines- Shingrix (1 of 2) Never done   DEXA SCAN  Never done   INFLUENZA VACCINE  11/24/2021    Immunization History  Administered Date(s) Administered   Fluad Quad(high Dose 65+) 12/29/2018, 01/02/2020, 01/15/2021   Influenza, High Dose Seasonal PF 12/28/2017   Influenza,inj,Quad PF,6+ Mos 01/02/2015, 12/31/2016   PFIZER(Purple Top)SARS-COV-2 Vaccination 06/15/2019, 07/03/2019, 02/19/2020   Pneumococcal Conjugate-13 03/15/2014   Pneumococcal Polysaccharide-23 04/27/2008, 12/31/2016   Zoster, Live 04/27/2008    Hypertension This is a chronic problem. The problem is controlled. Pertinent negatives include no chest pain, headaches, palpitations or shortness of breath. Past treatments include diuretics and angiotensin blockers.  Diabetes She presents for her follow-up diabetic visit. She has type 2 diabetes mellitus. Her disease course has been stable. Pertinent negatives for hypoglycemia include no dizziness, headaches, nervousness/anxiousness or tremors. Pertinent negatives for diabetes include no chest pain, no fatigue, no foot paresthesias, no polydipsia and no polyuria. Pertinent negatives for diabetic complications include no nephropathy. Current diabetic treatments: metformin, insulin B-B, Trulicity. She is compliant with treatment all of the time. An ACE inhibitor/angiotensin II receptor blocker is being taken. Eye exam is current (due soon).  Hyperlipidemia This is a chronic problem. The problem is uncontrolled. Pertinent negatives include no chest  pain or shortness of breath. Current antihyperlipidemic treatment includes statins and fibric acid derivatives. The current treatment provides moderate improvement of lipids.    Lab Results  Component Value Date   NA 138 09/16/2021   K 4.9 09/16/2021   CO2 21 09/16/2021   GLUCOSE 130 (H) 09/16/2021   BUN 22 09/16/2021   CREATININE 0.86 09/16/2021   CALCIUM 9.7 09/16/2021   EGFR 70 09/16/2021   GFRNONAA 76 01/02/2020   Lab Results  Component Value Date   CHOL 156 01/15/2021   HDL 55 01/15/2021   LDLCALC 85 01/15/2021   TRIG 86 01/15/2021   CHOLHDL 2.8 01/15/2021   Lab Results  Component Value Date   TSH 2.890 01/15/2021   Lab Results  Component Value Date   HGBA1C 6.4 (H) 09/16/2021   Lab Results  Component Value Date   WBC 4.8 01/15/2021   HGB 12.8 01/15/2021   HCT 38.7 01/15/2021   MCV 91 01/15/2021   PLT 201 01/15/2021   Lab Results  Component Value Date   ALT 14 01/15/2021   AST 22 01/15/2021   ALKPHOS 87 01/15/2021   BILITOT 0.5 01/15/2021   Lab Results  Component Value Date   VD25OH 70.1 01/02/2020     Review of Systems  Constitutional:  Negative for chills, fatigue and fever.  HENT:  Negative for congestion, hearing loss, tinnitus, trouble swallowing and voice change.   Eyes:  Negative for visual disturbance.  Respiratory:  Negative for cough, chest tightness, shortness of breath and wheezing.   Cardiovascular:  Negative for chest pain, palpitations and leg swelling.  Gastrointestinal:  Negative for abdominal pain, constipation, diarrhea and vomiting.  Endocrine: Negative for polydipsia and polyuria.  Genitourinary:  Negative for dysuria, frequency, genital sores, vaginal  bleeding and vaginal discharge.  Musculoskeletal:  Negative for arthralgias, gait problem and joint swelling.  Skin:  Negative for color change and rash.  Neurological:  Negative for dizziness, tremors, light-headedness and headaches.  Hematological:  Negative for adenopathy. Does  not bruise/bleed easily.  Psychiatric/Behavioral:  Negative for dysphoric mood and sleep disturbance. The patient is not nervous/anxious.     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   Abdominal aortic ectasia ([place]) 12/11/2015   Osteoarthritis of right knee 12/10/2015   Arthritis of hand 10/06/2015   COPD, moderate ([place]) 11/19/2014   Edema extremities 11/19/2014   Esophagitis determined by endoscopy 11/19/2014   Hyperlipidemia associated with type 2 diabetes mellitus ([place]) 11/19/2014   Idiopathic osteoporosis 11/19/2014   Tobacco abuse, 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   Venous insufficiency of leg 11/19/2014   Type II diabetes mellitus with complication ([place]) 91/63/8466   Morbid obesity ([place]) 10/17/2013   Essential hypertension 10/17/2013   Chronic cough 10/17/2013    Allergies  Allergen Reactions   Codeine Hives and Itching   Combivent [Ipratropium-Albuterol] Itching   Ivp Dye [Iodinated Contrast Media] Hives and Itching   Meloxicam     Past Surgical History:  Procedure Laterality Date   BREAST BIOPSY     CARPAL TUNNEL RELEASE     right   CERVICAL FUSION  2002   CHOLECYSTECTOMY     ESOPHAGOGASTRODUODENOSCOPY  03/2014   benign stricture dilated   HERNIA REPAIR     LUMBAR FUSION     L1-4    REPLACEMENT TOTAL KNEE Left 12/22/208   TOTAL ABDOMINAL HYSTERECTOMY      Social History   Tobacco Use   Smoking status: Former    Packs/day: 2.00    Years: 15.00    Total pack years: 30.00    Types: Cigarettes    Quit date: 1989    Years since quitting: 34.7   Smokeless tobacco: Never   Tobacco comments:    smoking cessation materials not required  Vaping Use   Vaping Use: Never used  Substance Use Topics   Alcohol use: Yes    Comment: rare   Drug use: No     Medication list  has been reviewed and updated.  No outpatient medications have been marked as taking for the 01/29/22 encounter (Office Visit) with Glean Hess, MD.   Current Facility-Administered Medications for the 01/29/22 encounter (Office Visit) with Glean Hess, MD  Medication   albuterol (PROVENTIL) (2.5 MG/3ML) 0.083% nebulizer solution 2.5 mg       01/29/2022   11:06 AM 09/16/2021   10:12 AM 05/19/2021   10:20 AM 01/15/2021   10:27 AM  GAD 7 : Generalized Anxiety Score  Nervous, Anxious, on Edge 0 0 0 0  Control/stop worrying 0 0 0 0  Worry too much - different things 0 0 2 0  Trouble relaxing 0 0 0 0  Restless 0 0 0 0  Easily annoyed or irritable 0 0 0 0  Afraid - awful might happen 0 0 0 0  Total GAD 7 Score 0 0 2 0  Anxiety Difficulty Not difficult at all Not difficult at all Somewhat difficult        01/29/2022   11:06 AM 09/16/2021   10:11 AM 05/19/2021   10:20 AM  Depression screen PHQ 2/9  Decreased Interest 0 0 0  Down, Depressed, Hopeless 0 0 0  PHQ - 2 Score 0 0 0  Altered sleeping 0 0 0  Tired, decreased energy 0 2 0  Change in appetite 0 0 0  Feeling bad or failure about yourself  0 0 0  Trouble concentrating 0 0 0  Moving slowly or fidgety/restless 0 0 0  Suicidal thoughts 0 0 0  PHQ-9 Score 0 2 0  Difficult doing work/chores Not difficult at all Not difficult at all Not difficult at all    BP Readings from Last 3 Encounters:  01/29/22 128/78  09/16/21 124/72  05/19/21 128/76    Physical Exam Vitals and nursing note reviewed.  Constitutional:      General: She is not in acute distress.    Appearance: She is well-developed.  HENT:     Head: Normocephalic and atraumatic.     Right Ear: Tympanic membrane and ear canal normal.     Left Ear: Tympanic membrane and ear canal normal.     Nose:     Right Sinus: No maxillary sinus tenderness.     Left Sinus: No maxillary sinus tenderness.  Eyes:     General: No scleral icterus.       Right eye: No  discharge.        Left eye: No discharge.     Conjunctiva/sclera: Conjunctivae normal.  Neck:     Thyroid: No thyromegaly.     Vascular: No carotid bruit.  Cardiovascular:     Rate and Rhythm: Normal rate and regular rhythm.     Pulses: Normal pulses.     Heart sounds: Normal heart sounds.  Pulmonary:     Effort: Pulmonary effort is normal. No respiratory distress.     Breath sounds: No wheezing.  Chest:  Breasts:    Right: No mass, nipple discharge, skin change or tenderness.     Left: No mass, nipple discharge, skin change or tenderness.  Abdominal:     General: Bowel sounds are normal.     Palpations: Abdomen is soft.     Tenderness: There is no abdominal tenderness.  Musculoskeletal:     Cervical back: Normal range of motion. No erythema.     Right lower leg: No edema.     Left lower leg: No edema.  Lymphadenopathy:     Cervical: No cervical adenopathy.  Skin:    General: Skin is warm and dry.     Findings: No rash.  Neurological:     Mental Status: She is alert and oriented to person, place, and time.     Cranial Nerves: No cranial nerve deficit.     Sensory: No sensory deficit.     Deep Tendon Reflexes: Reflexes are normal and symmetric.  Psychiatric:        Attention and Perception: Attention normal.        Mood and Affect: Mood normal.    Diabetic Foot Exam - Simple   Simple Foot Form Diabetic Foot exam was performed with the following findings: Yes 01/29/2022 11:26 AM  Visual Inspection No deformities, no ulcerations, no other skin breakdown bilaterally: Yes Sensation Testing Intact to touch and monofilament testing bilaterally: Yes Pulse Check Posterior Tibialis and Dorsalis pulse intact bilaterally: Yes Comments      Wt Readings from Last 3 Encounters:  01/29/22 227 lb (103 kg)  09/16/21 222 lb (100.7 kg)  05/19/21 226 lb 12.8 oz (102.9 kg)  BP 128/78   Pulse 73   Ht 5' 2"  (1.575 m)   Wt 227 lb (103 kg)   SpO2 98%   BMI 41.52 kg/m    Assessment and Plan: 1. Annual physical exam Exam is normal except for weight.  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 He is on Hospice now.  2. Encounter for screening mammogram for breast cancer Schedule at [facility] in November - MM 3D SCREEN BREAST BILATERAL  3. Essential hypertension Clinically stable exam with well controlled BP. Tolerating medications without side effects at this time. Pt to continue current regimen and low sodium diet; benefits of regular exercise as able discussed. - CBC with Differential/Platelet - Comprehensive metabolic panel - TSH - POCT urinalysis dipstick  4. Iliac artery aneurysm [facility]) Due to follow up with Dr. Lucky Cowboy in November  5. COPD, moderate ([place]) Stable without recent flare or URI  6. Type II diabetes mellitus with complication ([facility]) Clinically stable by exam and report without s/s of hypoglycemia. DM complicated by hypertension and dyslipidemia. Tolerating medications well without side effects or other concerns. - Hemoglobin A1c - Microalbumin / creatinine urine ratio - Insulin Pen Needle (B-D UF III MINI PEN NEEDLES) 31G X 5 MM MISC; USE AND DISCARD 1 PEN      NEEDLE 4 TIMES A DAY  Dispense: 360 each; Refill: 3  7. Hyperlipidemia associated with type 2 diabetes mellitus ([facility]) - Lipid panel  8. Idiopathic osteoporosis Continue Fosamax; check DEXA which has not been done in years - VITAMIN D 25 Hydroxy (Vit-D Deficiency, Fractures) - DG Bone Density   Partially dictated using Editor, commissioning. Any errors are unintentional.  Halina Maidens, MD [facility]  01/29/2022

## 2022-01-29 NOTE — Telephone Encounter (Signed)
Copied from [place] [phone]. Topic: General - Inquiry >> Jan 29, 2022  4:11 PM Teressa P wrote: Reason for CRM: pt called saying she needs an order put in for a mammogram and bone density  she goes to [place]

## 2022-01-29 NOTE — Telephone Encounter (Signed)
Dr Army Melia ordered both today at visit.  - Lalita Ebel

## 2022-01-30 LAB — LIPID PANEL
Chol/HDL Ratio: 2.7 ratio (ref 0.0–4.4)
Cholesterol, Total: 149 mg/dL (ref 100–199)
HDL: 56 mg/dL (ref >39)
LDL Chol Calc (NIH): 77 mg/dL (ref 0–99)
Triglycerides: 82 mg/dL (ref 0–149)
VLDL Cholesterol Cal: 16 mg/dL (ref 5–40)

## 2022-01-30 LAB — CBC WITH DIFFERENTIAL/PLATELET
Basophils Absolute: 0 10*3/uL (ref 0.0–0.2)
Basos: 1 %
EOS (ABSOLUTE): 0.1 10*3/uL (ref 0.0–0.4)
Eos: 2 %
Hematocrit: 39.2 % (ref 34.0–46.6)
Hemoglobin: 12.6 g/dL (ref 11.1–15.9)
Immature Grans (Abs): 0 10*3/uL (ref 0.0–0.1)
Immature Granulocytes: 0 %
Lymphocytes Absolute: 0.8 10*3/uL (ref 0.7–3.1)
Lymphs: 18 %
MCH: 29.4 pg (ref 26.6–33.0)
MCHC: 32.1 g/dL (ref 31.5–35.7)
MCV: 92 fL (ref 79–97)
Monocytes Absolute: 0.4 10*3/uL (ref 0.1–0.9)
Monocytes: 8 %
Neutrophils Absolute: 3.3 10*3/uL (ref 1.4–7.0)
Neutrophils: 71 %
Platelets: 193 10*3/uL (ref 150–450)
RBC: 4.28 x10E6/uL (ref 3.77–5.28)
RDW: 13.1 % (ref 11.7–15.4)
WBC: 4.6 10*3/uL (ref 3.4–10.8)

## 2022-01-30 LAB — COMPREHENSIVE METABOLIC PANEL
ALT: 18 IU/L (ref 0–32)
AST: 23 IU/L (ref 0–40)
Albumin/Globulin Ratio: 2 (ref 1.2–2.2)
Albumin: 4.7 g/dL (ref 3.8–4.8)
Alkaline Phosphatase: 80 IU/L (ref 44–121)
BUN/Creatinine Ratio: 26 (ref 12–28)
BUN: 21 mg/dL (ref 8–27)
Bilirubin Total: 0.5 mg/dL (ref 0.0–1.2)
CO2: 22 mmol/L (ref 20–29)
Calcium: 10 mg/dL (ref 8.7–10.3)
Chloride: 101 mmol/L (ref 96–106)
Creatinine, Ser: 0.82 mg/dL (ref 0.57–1.00)
Globulin, Total: 2.4 g/dL (ref 1.5–4.5)
Glucose: 119 mg/dL — ABNORMAL HIGH (ref 70–99)
Potassium: 5 mmol/L (ref 3.5–5.2)
Sodium: 140 mmol/L (ref 134–144)
Total Protein: 7.1 g/dL (ref 6.0–8.5)
eGFR: 74 mL/min/{1.73_m2} (ref >59)

## 2022-01-30 LAB — VITAMIN D 25 HYDROXY (VIT D DEFICIENCY, FRACTURES): Vit D, 25-Hydroxy: 77.8 ng/mL (ref 30.0–100.0)

## 2022-01-30 LAB — MICROALBUMIN / CREATININE URINE RATIO
Creatinine, Urine: 119.2 mg/dL
Creatinine, Urine: 158.3 mg/dL
Microalb/Creat Ratio: 20 mg/g creat (ref 0–29)
Microalb/Creat Ratio: 12 mg/g creat (ref 0–29)
Microalbumin, Urine: 23.9 ug/mL
Microalbumin, Urine: 18.6 ug/mL

## 2022-01-30 LAB — HEMOGLOBIN A1C
Est. average glucose Bld gHb Est-mCnc: 131 mg/dL
Hgb A1c MFr Bld: 6.2 % — ABNORMAL HIGH (ref 4.8–5.6)

## 2022-01-30 LAB — TSH: TSH: 2.72 u[IU]/mL (ref 0.450–4.500)

## 2022-03-02 ENCOUNTER — Encounter (INDEPENDENT_AMBULATORY_CARE_PROVIDER_SITE_OTHER): Payer: Medicare Other

## 2022-03-02 ENCOUNTER — Ambulatory Visit (INDEPENDENT_AMBULATORY_CARE_PROVIDER_SITE_OTHER): Payer: Medicare Other | Admitting: Vascular Surgery

## 2022-03-04 ENCOUNTER — Other Ambulatory Visit: Payer: Self-pay | Admitting: Internal Medicine

## 2022-03-04 DIAGNOSIS — E118 Type 2 diabetes mellitus with unspecified complications: Secondary | ICD-10-CM

## 2022-03-04 DIAGNOSIS — K209 Esophagitis, unspecified without bleeding: Secondary | ICD-10-CM

## 2022-03-04 DIAGNOSIS — I10 Essential (primary) hypertension: Secondary | ICD-10-CM

## 2022-03-04 NOTE — Telephone Encounter (Signed)
Requested Prescriptions  Pending Prescriptions Disp Refills   gemfibrozil (LOPID) 600 MG tablet [Pharmacy Med Name: GEMFIBROZIL 600 MG TAB] 180 tablet 0    Sig: TAKE ONE (1) TABLET BY MOUTH TWO TIMES PER DAY     Cardiovascular:  Antilipid - Fibric Acid Derivatives Failed - 03/04/2022  9:28 AM      Failed - Lipid Panel in normal range within the last 12 months    Cholesterol, Total  Date Value Ref Range Status  01/29/2022 149 100 - 199 mg/dL Final   LDL Chol Calc (NIH)  Date Value Ref Range Status  01/29/2022 77 0 - 99 mg/dL Final   HDL  Date Value Ref Range Status  01/29/2022 56 >39 mg/dL Final   Triglycerides  Date Value Ref Range Status  01/29/2022 82 0 - 149 mg/dL Final         Passed - ALT in normal range and within 360 days    ALT  Date Value Ref Range Status  01/29/2022 18 0 - 32 IU/L Final         Passed - AST in normal range and within 360 days    AST  Date Value Ref Range Status  01/29/2022 23 0 - 40 IU/L Final         Passed - Cr in normal range and within 360 days    Creatinine, Ser  Date Value Ref Range Status  01/29/2022 0.82 0.57 - 1.00 mg/dL Final         Passed - HGB in normal range and within 360 days    Hemoglobin  Date Value Ref Range Status  01/29/2022 12.6 11.1 - 15.9 g/dL Final         Passed - HCT in normal range and within 360 days    Hematocrit  Date Value Ref Range Status  01/29/2022 39.2 34.0 - 46.6 % Final         Passed - PLT in normal range and within 360 days    Platelets  Date Value Ref Range Status  01/29/2022 193 150 - 450 x10E3/uL Final         Passed - WBC in normal range and within 360 days    WBC  Date Value Ref Range Status  01/29/2022 4.6 3.4 - 10.8 x10E3/uL Final  12/24/2014 7.2 3.6 - 11.0 K/uL Final         Passed - eGFR is 30 or above and within 360 days    GFR calc Af Amer  Date Value Ref Range Status  01/02/2020 88 >59 mL/min/1.73 Final    Comment:    **[facility] currently reports eGFR in compliance  with the current**   recommendations of the [employer]. [facility] will   update reporting as new guidelines are published from the NKF-ASN   Task force.    GFR calc non Af Amer  Date Value Ref Range Status  01/02/2020 76 >59 mL/min/1.73 Final   eGFR  Date Value Ref Range Status  01/29/2022 74 >59 mL/min/1.73 Final         Passed - Valid encounter within last 12 months    Recent Outpatient Visits           1 month ago Annual physical exam   [place] [facility] at [facility], Jesse Sans, MD   5 months ago Type II diabetes mellitus with complication [facility])   [place] [facility] at [facility], Mickel Baas  H, MD   9 months ago Essential hypertension   [place] [facility] at [facility], Jesse Sans, MD   1 year ago Annual physical exam   [place] [facility] at [facility], Jesse Sans, MD   1 year ago Type II diabetes mellitus with complication [facility])   [place] [facility] and Sports Medicine at [facility], Jesse Sans, MD       Future Appointments             In 3 months Army Melia Jesse Sans, MD [facility] Health Primary Care and Sports Medicine at [facility], [facility]   In 11 months Glean Hess, MD [facility] Primary Care and Sports Medicine at [facility], [place]             metFORMIN (GLUCOPHAGE) 500 MG tablet [Pharmacy Med Name: METFORMIN HCL 500 MG TAB] 180 tablet 0    Sig: TAKE ONE (1) TABLET BY MOUTH TWO TIMES PER DAY     Endocrinology:  Diabetes - Biguanides Failed - 03/04/2022  9:28 AM      Failed - B12 Level in normal range and within 720 days    No results found for: "VITAMINB12"       Passed - Cr in normal range and within 360 days    Creatinine, Ser  Date Value Ref Range Status  01/29/2022 0.82 0.57 - 1.00 mg/dL Final         Passed - HBA1C is between 0 and 7.9 and within 180 days     Hgb A1c MFr Bld  Date Value Ref Range Status  01/29/2022 6.2 (H) 4.8 - 5.6 % Final    Comment:             Prediabetes: 5.7 - 6.4          Diabetes: >6.4          Glycemic control for adults with diabetes: <7.0          Passed - eGFR in normal range and within 360 days    GFR calc Af Amer  Date Value Ref Range Status  01/02/2020 88 >59 mL/min/1.73 Final    Comment:    **[facility] currently reports eGFR in compliance with the current**   recommendations of the [employer]. [facility] will   update reporting as new guidelines are published from the NKF-ASN   Task force.    GFR calc non Af Amer  Date Value Ref Range Status  01/02/2020 76 >59 mL/min/1.73 Final   eGFR  Date Value Ref Range Status  01/29/2022 74 >59 mL/min/1.73 Final         Passed - Valid encounter within last 6 months    Recent Outpatient Visits           1 month ago Annual physical exam   [place] [facility] at [facility], Jesse Sans, MD   5 months ago Type II diabetes mellitus with complication [facility])   [place] [facility] and [facility] Medicine at [facility], Jesse Sans, MD   9 months ago Essential hypertension   [place] [facility] at [facility], Jesse Sans, MD   1 year ago Annual physical exam   [facility] and [facility] at [facility], Jesse Sans, MD   1 year ago Type II diabetes mellitus with complication ([place])   [facility] at [facility], Jesse Sans, MD       Future Appointments             In 3 months Army Melia Jesse Sans, MD [facility] Health Primary Care and Sports Medicine at [facility]   In 11 months Glean Hess, MD [facility] Health Primary Care and Sports Medicine at [facility], [place] within normal limits and completed in the last 12 months    WBC  Date Value Ref Range Status   01/29/2022 4.6 3.4 - 10.8 x10E3/uL Final  12/24/2014 7.2 3.6 - 11.0 K/uL Final   RBC  Date Value Ref Range Status  01/29/2022 4.28 3.77 - 5.28 x10E6/uL Final  03/15/2016 0 (A) 4.04 - 5.48 M/uL Final  12/24/2014 4.52 3.80 - 5.20 MIL/uL Final   Hemoglobin  Date Value Ref Range Status  01/29/2022 12.6 11.1 - 15.9 g/dL Final   Hematocrit  Date Value Ref Range Status  01/29/2022 39.2 34.0 - 46.6 % Final   MCHC  Date Value Ref Range Status  01/29/2022 32.1 31.5 - 35.7 g/dL Final  12/24/2014 32.7 32.0 - 36.0 g/dL Final   [facility]  Date Value Ref Range Status  01/29/2022 29.4 26.6 - 33.0 pg Final  12/24/2014 28.4 26.0 - 34.0 pg Final   MCV  Date Value Ref Range Status  01/29/2022 92 79 - 97 fL Final   No results found for: "PLTCOUNTKUC", "LABPLAT", "POCPLA" RDW  Date Value Ref Range Status  01/29/2022 13.1 11.7 - 15.4 % Final          atorvastatin (LIPITOR) 20 MG tablet [Pharmacy Med Name: ATORVASTATIN CALCIUM 20 MG TAB] 90 tablet 0    Sig: TAKE 1 TABLET BY MOUTH AT BEDTIME     Cardiovascular:  Antilipid - Statins Failed - 03/04/2022  9:28 AM      Failed - Lipid Panel in normal range within the last 12 months    Cholesterol, Total  Date Value Ref Range Status  01/29/2022 149 100 - 199 mg/dL Final   LDL Chol Calc (NIH)  Date Value Ref Range Status  01/29/2022 77 0 - 99 mg/dL Final   HDL  Date Value Ref Range Status  01/29/2022 56 >39 mg/dL Final   Triglycerides  Date Value Ref Range Status  01/29/2022 82 0 - 149 mg/dL Final         Passed - Patient is not pregnant      Passed - Valid encounter within last 12 months    Recent Outpatient Visits           1 month ago Annual physical exam   [place] [facility] at [facility], Jesse Sans, MD   5 months ago Type II diabetes mellitus with complication [facility])   [place] [facility] at [facility], Jesse Sans, MD   9 months ago Essential  hypertension   [place] [facility] at [facility], Jesse Sans, MD   1 year ago Annual physical exam   [place] [facility] at [facility] Jesse Sans, MD   1 year ago Type II diabetes mellitus with complication [facility])    Primary Care and Sports Medicine at [facility], Jesse Sans, MD       Future Appointments  In 3 months Glean Hess, MD [facility] Health Primary Care and Sports Medicine at [facility], [facility]   In 11 months Glean Hess, MD [facility] Primary Care and Sports Medicine at [facility] (MAXZIDE-25) 37.5-25 MG tablet [Pharmacy Med Name: TRIAMTERENE-HCTZ 37.5-25 MG TAB] 90 tablet 0    Sig: TAKE 1 TABLET BY MOUTH DAILY     Cardiovascular: Diuretic Combos Passed - 03/04/2022  9:28 AM      Passed - K in normal range and within 180 days    Potassium  Date Value Ref Range Status  01/29/2022 5.0 3.5 - 5.2 mmol/L Final         Passed - Na in normal range and within 180 days    Sodium  Date Value Ref Range Status  01/29/2022 140 134 - 144 mmol/L Final         Passed - Cr in normal range and within 180 days    Creatinine, Ser  Date Value Ref Range Status  01/29/2022 0.82 0.57 - 1.00 mg/dL Final         Passed - Last BP in normal range    BP Readings from Last 1 Encounters:  01/29/22 128/78         Passed - Valid encounter within last 6 months    Recent Outpatient Visits           1 month ago Annual physical exam   [place] [facility] at [facility], Jesse Sans, MD   5 months ago Type II diabetes mellitus with complication [facility])   [place] [facility] Care and Sports Medicine at [facility], Jesse Sans, MD   9 months ago Essential hypertension   [place] Primary Care and Sports Medicine at [facility], Jesse Sans, MD   1 year ago Annual  physical exam   [place] Primary Care and Sports Medicine at [facility], Jesse Sans, MD   1 year ago Type II diabetes mellitus with complication [facility])   [place] Primary Care and Sports Medicine at [facility], Jesse Sans, MD       Future Appointments             In 3 months Army Melia Jesse Sans, MD [facility] Health Primary Care and Sports Medicine at [facility], [facility]   In 11 months Army Melia Jesse Sans, MD [facility] Health Primary Care and Sports Medicine at [place], [place]             omeprazole (PRILOSEC) 20 MG capsule [Pharmacy Med Name: OMEPRAZOLE DR 20 MG CAP] 90 capsule 0    Sig: TAKE 1 CAPSULE BY MOUTH ONCE DAILY     Gastroenterology: Proton Pump Inhibitors Passed - 03/04/2022  9:28 AM      Passed - Valid encounter within last 12 months    Recent Outpatient Visits           1 month ago Annual physical exam   [place] [facility] at [facility], Jesse Sans, MD   5 months ago Type II diabetes mellitus with complication [facility])    Primary Care and Sports Medicine at [facility], Jesse Sans, MD   9 months ago Essential hypertension   [place] and Sports Medicine at [facility], Jesse Sans, MD   1 year ago Annual physical exam  [place] Primary Care and Sports Medicine at [facility], Jesse Sans, MD   1 year ago Type II diabetes mellitus with complication [facility])   [place] [facility] at [facility], Jesse Sans, MD       Future Appointments             In 3 months Army Melia Jesse Sans, MD [facility] Health Primary Care and Sports Medicine at [facility], [facility]   In 11 months Glean Hess, MD [facility] and [facility] at [facility]             losartan (COZAAR) 25 MG tablet [Pharmacy Med Name: LOSARTAN POTASSIUM 25 MG TAB] 90 tablet 0    Sig: TAKE 1 TABLET BY MOUTH DAILY      Cardiovascular:  Angiotensin Receptor Blockers Passed - 03/04/2022  9:28 AM      Passed - Cr in normal range and within 180 days    Creatinine, Ser  Date Value Ref Range Status  01/29/2022 0.82 0.57 - 1.00 mg/dL Final         Passed - K in normal range and within 180 days    Potassium  Date Value Ref Range Status  01/29/2022 5.0 3.5 - 5.2 mmol/L Final         Passed - Patient is not pregnant      Passed - Last BP in normal range    BP Readings from Last 1 Encounters:  01/29/22 128/78         Passed - Valid encounter within last 6 months    Recent Outpatient Visits           1 month ago Annual physical exam   [place] [facility] at [facility], Jesse Sans, MD   5 months ago Type II diabetes mellitus with complication [facility])   [place] [facility] at [facility], Jesse Sans, MD   9 months ago Essential hypertension   [place] [facility] and Sports Medicine at [facility], Jesse Sans, MD   1 year ago Annual physical exam   [place] [facility] at [facility], Jesse Sans, MD   1 year ago Type II diabetes mellitus with complication [facility])    Primary Care and Sports Medicine at [facility], Jesse Sans, MD       Future Appointments             In 3 months Army Melia, Jesse Sans, MD [facility] Health Primary Care and Sports Medicine at [facility], [facility]   In 11 months Army Melia, Jesse Sans, MD [place] Primary Care and Sports Medicine at [facility], [facility]

## 2022-03-04 NOTE — Telephone Encounter (Signed)
Unable to refill per protocol, Rx was refilled 03/04/22 for 90, will refuse duplicate request.  Requested Prescriptions  Pending Prescriptions Disp Refills   omeprazole (PRILOSEC) 20 MG capsule [Pharmacy Med Name: OMEPRAZOLE DR 20 MG CAP] 90 capsule 0    Sig: TAKE 1 CAPSULE BY MOUTH ONCE DAILY     Gastroenterology: Proton Pump Inhibitors Passed - 03/04/2022 10:16 AM      Passed - Valid encounter within last 12 months    Recent Outpatient Visits           1 month ago Annual physical exam   [place] [facility] at [facility], Jesse Sans, MD   5 months ago Type II diabetes mellitus with complication [facility])   [place] [facility] and [facility] Medicine at [facility], Jesse Sans, MD   9 months ago Essential hypertension   [place] [facility] and Sports Medicine at [facility], Jesse Sans, MD   1 year ago Annual physical exam   [place] Primary Care and Sports Medicine at [facility], Jesse Sans, MD   1 year ago Type II diabetes mellitus with complication [facility])   [place] Primary Care and Sports Medicine at [facility], Jesse Sans, MD       Future Appointments             In 3 months Army Melia, Jesse Sans, MD [facility] Health Primary Care and Sports Medicine at [facility], [facility]   In 11 months Army Melia, Jesse Sans, MD [place] Primary Care and Sports Medicine at [facility], [facility]

## 2022-03-15 LAB — HM DIABETES EYE EXAM

## 2022-03-16 ENCOUNTER — Encounter: Payer: Self-pay | Admitting: Internal Medicine

## 2022-03-22 ENCOUNTER — Encounter: Payer: Self-pay | Admitting: Internal Medicine

## 2022-03-22 ENCOUNTER — Ambulatory Visit (INDEPENDENT_AMBULATORY_CARE_PROVIDER_SITE_OTHER): Payer: HMO | Admitting: Internal Medicine

## 2022-03-22 ENCOUNTER — Telehealth: Payer: Self-pay | Admitting: Internal Medicine

## 2022-03-22 ENCOUNTER — Ambulatory Visit: Payer: Self-pay

## 2022-03-22 VITALS — BP 128/78 | HR 77 | Temp 98.3°F | Ht 62.0 in | Wt 227.4 lb

## 2022-03-22 DIAGNOSIS — J441 Chronic obstructive pulmonary disease with (acute) exacerbation: Secondary | ICD-10-CM

## 2022-03-22 MED ORDER — PREDNISONE 10 MG PO TABS: 10.0000 mg | ORAL_TABLET | ORAL | 0 refills | Status: AC

## 2022-03-22 MED ORDER — ALBUTEROL SULFATE HFA 108 (90 BASE) MCG/ACT IN AERS: 2.0000 | INHALATION_SPRAY | RESPIRATORY_TRACT | 5 refills | Status: AC | PRN

## 2022-03-22 MED ORDER — PROAIR RESPICLICK 108 (90 BASE) MCG/ACT IN AEPB: 1.0000 | INHALATION_SPRAY | RESPIRATORY_TRACT | 2 refills | Status: DC | PRN

## 2022-03-22 MED ORDER — AZITHROMYCIN 250 MG PO TABS: ORAL_TABLET | ORAL | 0 refills | Status: AC

## 2022-03-22 NOTE — Patient Instructions (Signed)
Monitor blood oxygen for levels higher than 88  Use nebulizer albuterol at least twice a day.  Use Albuterol inhaler every 3-4 hours as needed

## 2022-03-22 NOTE — Telephone Encounter (Signed)
Sherry the pharmacist at [facility] called in stating that she does not have the [place] pro air in at this time and was wondering if she can substitute it for the regular albuterol. Please assist further

## 2022-03-22 NOTE — Progress Notes (Signed)
Date:  03/22/2022   Name:  Molly Hoover   DOB:  December 05, 1945   MRN:  [id number]   Chief Complaint: Cough (Tested for Covid and Flu at [facility] on Saturday. Both were negative. These symptoms started 5 days ago.)  Cough This is a new problem. The current episode started in the past 7 days. The problem has been gradually worsening. The problem occurs every few minutes. The cough is Productive of sputum. Associated symptoms include chills, a fever, shortness of breath and wheezing. Pertinent negatives include no chest pain. Nothing aggravates the symptoms. She has tried a beta-agonist inhaler and prescription cough suppressant for the symptoms. The treatment provided no relief. Her past medical history is significant for COPD.    Lab Results  Component Value Date   NA 140 01/29/2022   K 5.0 01/29/2022   CO2 22 01/29/2022   GLUCOSE 119 (H) 01/29/2022   BUN 21 01/29/2022   CREATININE 0.82 01/29/2022   CALCIUM 10.0 01/29/2022   EGFR 74 01/29/2022   GFRNONAA 76 01/02/2020   Lab Results  Component Value Date   CHOL 149 01/29/2022   HDL 56 01/29/2022   LDLCALC 77 01/29/2022   TRIG 82 01/29/2022   CHOLHDL 2.7 01/29/2022   Lab Results  Component Value Date   TSH 2.720 01/29/2022   Lab Results  Component Value Date   HGBA1C 6.2 (H) 01/29/2022   Lab Results  Component Value Date   WBC 4.6 01/29/2022   HGB 12.6 01/29/2022   HCT 39.2 01/29/2022   MCV 92 01/29/2022   PLT 193 01/29/2022   Lab Results  Component Value Date   ALT 18 01/29/2022   AST 23 01/29/2022   ALKPHOS 80 01/29/2022   BILITOT 0.5 01/29/2022   Lab Results  Component Value Date   VD25OH 77.8 01/29/2022     Review of Systems  Constitutional:  Positive for appetite change, chills and fever.  HENT:  Negative for sinus pressure and trouble swallowing.   Respiratory:  Positive for cough, shortness of breath and wheezing.   Cardiovascular:  Negative for chest pain and palpitations.   Gastrointestinal:  Negative for abdominal pain, constipation and diarrhea.  Musculoskeletal:  Positive for arthralgias and gait problem.  Psychiatric/Behavioral:  Positive for sleep disturbance.     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   Abdominal aortic ectasia ([place]) 12/11/2015   Osteoarthritis of right knee 12/10/2015   Arthritis of hand 10/06/2015   COPD, moderate ([place]) 11/19/2014   Edema extremities 11/19/2014   Esophagitis determined by endoscopy 11/19/2014   Hyperlipidemia associated with type 2 diabetes mellitus ([place]) 11/19/2014   Idiopathic osteoporosis 11/19/2014   Tobacco abuse, 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   Venous insufficiency of leg 11/19/2014   Type II diabetes mellitus with complication ([place]) 99/37/1696   Morbid obesity ([place]) 10/17/2013   Essential hypertension 10/17/2013   Chronic cough 10/17/2013    Allergies  Allergen Reactions   Codeine Hives and Itching   Combivent [Ipratropium-Albuterol] Itching   Ivp Dye [Iodinated Contrast Media] Hives and Itching   Meloxicam     Past Surgical History:  Procedure Laterality Date   BREAST BIOPSY     CARPAL TUNNEL RELEASE     right   CERVICAL FUSION  2002   CHOLECYSTECTOMY  ESOPHAGOGASTRODUODENOSCOPY  03/2014   benign stricture dilated   HERNIA REPAIR     LUMBAR FUSION     L1-4    REPLACEMENT TOTAL KNEE Left 12/22/208   TOTAL ABDOMINAL HYSTERECTOMY      Social History   Tobacco Use   Smoking status: Former    Packs/day: 2.00    Years: 15.00    Total pack years: 30.00    Types: Cigarettes    Quit date: 1989    Years since quitting: 34.9   Smokeless tobacco: Never   Tobacco comments:    smoking cessation materials not required  Vaping Use   Vaping Use: Never used  Substance Use Topics   Alcohol use:  Yes    Comment: rare   Drug use: No     Medication list has been reviewed and updated.  Current Meds  Medication Sig   Albuterol Sulfate (PROAIR RESPICLICK) 673 (90 Base) MCG/ACT AEPB Inhale 1-2 puffs into the lungs every 4 (four) hours as needed.   alendronate (FOSAMAX) 70 MG tablet TAKE ONE TABLET EACH WEEK   atorvastatin (LIPITOR) 20 MG tablet TAKE 1 TABLET BY MOUTH AT BEDTIME   BREO ELLIPTA 200-25 MCG/ACT AEPB INHALE 1 INHALATION BY MOUTH DAILY.   Cranberry 500 MG CAPS Take 500 mg by mouth daily.    Dulaglutide (TRULICITY) 1.5 AL/9.3XT SOPN ADMINISTER 1.5 MG SUBCUTANEOUSLY ONCE A WEEK   gemfibrozil (LOPID) 600 MG tablet TAKE ONE (1) TABLET BY MOUTH TWO TIMES PER DAY   glucose blood (ONE TOUCH ULTRA TEST) test strip Use 2 times per day to test blood sugar.   insulin aspart (NOVOLOG) 100 UNIT/ML FlexPen Inject 25 Units into the skin 3 (three) times daily with meals.   Insulin Pen Needle (B-D UF III MINI PEN NEEDLES) 31G X 5 MM MISC USE AND DISCARD 1 PEN      NEEDLE 4 TIMES A DAY   LEVEMIR FLEXPEN 100 UNIT/ML FlexPen INJECT 40 UNITS UNDER THE SKIN IN THE MORNING AND 30 UNITS IN THE EVENING   losartan (COZAAR) 25 MG tablet TAKE 1 TABLET BY MOUTH DAILY   metFORMIN (GLUCOPHAGE) 500 MG tablet TAKE ONE (1) TABLET BY MOUTH TWO TIMES PER DAY   omeprazole (PRILOSEC) 20 MG capsule TAKE 1 CAPSULE BY MOUTH ONCE DAILY   OneTouch Delica Lancets 02I MISC 1 each by Does not apply route 2 (two) times daily.   triamterene-hydrochlorothiazide (MAXZIDE-25) 37.5-25 MG tablet TAKE 1 TABLET BY MOUTH DAILY   Vitamin D, Ergocalciferol, (DRISDOL) 1.25 MG (50000 UNIT) CAPS capsule TAKE ONE CAPSULE TWICE A WEEK   Current Facility-Administered Medications for the 03/22/22 encounter (Office Visit) with Glean Hess, MD  Medication   albuterol (PROVENTIL) (2.5 MG/3ML) 0.083% nebulizer solution 2.5 mg       01/29/2022   11:06 AM 09/16/2021   10:12 AM 05/19/2021   10:20 AM 01/15/2021   10:27 AM  GAD 7 :  Generalized Anxiety Score  Nervous, Anxious, on Edge 0 0 0 0  Control/stop worrying 0 0 0 0  Worry too much - different things 0 0 2 0  Trouble relaxing 0 0 0 0  Restless 0 0 0 0  Easily annoyed or irritable 0 0 0 0  Afraid - awful might happen 0 0 0 0  Total GAD 7 Score 0 0 2 0  Anxiety Difficulty Not difficult at all Not difficult at all Somewhat difficult        01/29/2022   11:06 AM 09/16/2021  10:11 AM 05/19/2021   10:20 AM  Depression screen PHQ 2/9  Decreased Interest 0 0 0  Down, Depressed, Hopeless 0 0 0  PHQ - 2 Score 0 0 0  Altered sleeping 0 0 0  Tired, decreased energy 0 2 0  Change in appetite 0 0 0  Feeling bad or failure about yourself  0 0 0  Trouble concentrating 0 0 0  Moving slowly or fidgety/restless 0 0 0  Suicidal thoughts 0 0 0  PHQ-9 Score 0 2 0  Difficult doing work/chores Not difficult at all Not difficult at all Not difficult at all    BP Readings from Last 3 Encounters:  03/22/22 128/78  01/29/22 128/78  09/16/21 124/72    Physical Exam Vitals and nursing note reviewed.  Constitutional:      General: She is not in acute distress.    Appearance: She is well-developed. She is obese.  HENT:     Head: Normocephalic and atraumatic.  Cardiovascular:     Rate and Rhythm: Normal rate and regular rhythm.  Pulmonary:     Effort: Pulmonary effort is normal. No respiratory distress.     Breath sounds: Examination of the right-upper field reveals rhonchi. Examination of the left-upper field reveals rhonchi. Rhonchi present. No wheezing.  Musculoskeletal:     Cervical back: Normal range of motion.  Lymphadenopathy:     Cervical: No cervical adenopathy.  Skin:    General: Skin is warm and dry.     Findings: No rash.  Neurological:     Mental Status: She is alert and oriented to person, place, and time.  Psychiatric:        Mood and Affect: Mood normal.        Behavior: Behavior normal.     Wt Readings from Last 3 Encounters:  03/22/22 227  lb 6.4 oz (103.1 kg)  01/29/22 227 lb (103 kg)  09/16/21 222 lb (100.7 kg)    BP 128/78   Pulse 77   Temp 98.3 F (36.8 C) (Oral)   Ht _0  (1.575 m)   Wt 227 lb 6.4 oz (103.1 kg)   SpO2 90%   BMI 41.59 kg/m   Assessment and Plan: 1. COPD with acute exacerbation (HCC) Recommend albuterol nebs at least twice a day Use albuterol MDI every 3-4 hours Continue cough syrup from UC. Monitor O2 sats for > 88 - predniSONE (DELTASONE) 10 MG tablet; Take 1 tablet (10 mg total) by mouth as directed for 6 days. Take 6,5,4,3,2,1 then stop  Dispense: 21 tablet; Refill: 0 - albuterol (VENTOLIN HFA) 108 (90 Base) MCG/ACT inhaler; Inhale 2 puffs into the lungs every 4 (four) hours as needed for wheezing or shortness of breath.  Dispense: 1 each; Refill: 5 - azithromycin (ZITHROMAX Z-PAK) 250 MG tablet; UAD  Dispense: 6 each; Refill: 0   Partially dictated using Editor, commissioning. Any errors are unintentional.  Halina Maidens, MD [facility]  03/22/2022

## 2022-03-22 NOTE — Telephone Encounter (Signed)
  Chief Complaint: cough SOB Symptoms: frequent cough, wheezing, greenish yellow phlegm, muscle aches and chest aches, fever to 99.9 Frequency: Wednesday Pertinent Negatives: Patient denies runny nose Disposition: '[]'$ ED /'[]'$ Urgent Care (no appt availability in office) / '[x]'$ Appointment(In office/virtual)/ '[]'$  [place] [facility]/ '[]'$ Home Care/ '[]'$ Refused Recommended Disposition /'[]'$ [place] [facility]/ '[]'$  Follow-up with PCP Additional Notes: 1020 appt with Dr. Army Melia. [facility]. Pt out of Albuterol Reason for Disposition  [1] Known COPD or other severe lung disease (i.e., bronchiectasis, cystic fibrosis, lung surgery) AND [2] worsening symptoms (i.e., increased sputum purulence or amount, increased breathing difficulty  Answer Assessment - Initial Assessment Questions 1. ONSET: "When did the cough begin?"      Wednesday 2. SEVERITY: "How bad is the cough today?"      Severe coughing  3. SPUTUM: "Describe the color of your sputum" (none, dry cough; clear, white, yellow, green)     Green yellow 4. HEMOPTYSIS: "Are you coughing up any blood?" If so ask: "How much?" (flecks, streaks, tablespoons, etc.)     no 5. DIFFICULTY BREATHING: "Are you having difficulty breathing?" If Yes, ask: "How bad is it?" (e.g., mild, moderate, severe)    - MILD: No SOB at rest, mild SOB with walking, speaks normally in sentences, can lie down, no retractions, pulse < 100.    - MODERATE: SOB at rest, SOB with minimal exertion and prefers to sit, cannot lie down flat, speaks in phrases, mild retractions, audible wheezing, pulse 100-120.    - SEVERE: Very SOB at rest, speaks in single words, struggling to breathe, sitting hunched forward, retractions, pulse > 120      moderate 6. FEVER: "Do you have a fever?" If Yes, ask: "What is your temperature, how was it measured, and when did it start?"     99.9 7. CARDIAC HISTORY: "Do you have any history of heart disease?" (e.g., heart attack, congestive  heart failure)      N/a 8. LUNG HISTORY: "Do you have any history of lung disease?"  (e.g., pulmonary embolus, asthma, emphysema)     COPD, asthma 9. PE RISK FACTORS: "Do you have a history of blood clots?" (or: recent major surgery, recent prolonged travel, bedridden)     N/a 10. OTHER SYMPTOMS: "Do you have any other symptoms?" (e.g., runny nose, wheezing, chest pain)       Wheezing, SOB, muscle aches and sore chest  11. PREGNANCY: "Is there any chance you are pregnant?" "When was your last menstrual period?"       N/a 12. TRAVEL: "Have you traveled out of the country in the last month?" (e.g., travel history, exposures)       N/a  Protocols used: Cough - Acute Productive-A-AH

## 2022-03-23 ENCOUNTER — Other Ambulatory Visit: Payer: Self-pay | Admitting: Internal Medicine

## 2022-03-23 DIAGNOSIS — J449 Chronic obstructive pulmonary disease, unspecified: Secondary | ICD-10-CM

## 2022-03-24 NOTE — Telephone Encounter (Signed)
Requested Prescriptions  Pending Prescriptions Disp Refills   BREO ELLIPTA 200-25 MCG/ACT AEPB [Pharmacy Med Name: BREO ELLIPTA 200-25 MCG/ACT INH AEP] 60 each 0    Sig: INHALE 1 INHALATION BY MOUTH DAILY.     Pulmonology:  Combination Products Passed - 03/23/2022  2:18 PM      Passed - Valid encounter within last 12 months    Recent Outpatient Visits           2 days ago COPD with acute exacerbation [facility])   [place] [facility] at [facility], Jesse Sans, MD   1 month ago Annual physical exam   [place] [facility] at [facility], Jesse Sans, MD   6 months ago Type II diabetes mellitus with complication [facility])   [place] [facility] at [facility], Jesse Sans, MD   10 months ago Essential hypertension   [place] [facility] at [facility] Jesse Sans, MD   1 year ago Annual physical exam   [facility] at [facility], Jesse Sans, MD       Future Appointments             In 2 months Army Melia, Jesse Sans, MD [facility] at [facility], [facility]   In 10 months Army Melia, Jesse Sans, MD [place] [facility] at [facility], [facility]

## 2022-04-06 ENCOUNTER — Other Ambulatory Visit (INDEPENDENT_AMBULATORY_CARE_PROVIDER_SITE_OTHER): Payer: Self-pay | Admitting: Vascular Surgery

## 2022-04-06 DIAGNOSIS — I723 Aneurysm of iliac artery: Secondary | ICD-10-CM

## 2022-04-07 ENCOUNTER — Encounter (INDEPENDENT_AMBULATORY_CARE_PROVIDER_SITE_OTHER): Payer: Self-pay | Admitting: Nurse Practitioner

## 2022-04-07 ENCOUNTER — Ambulatory Visit (INDEPENDENT_AMBULATORY_CARE_PROVIDER_SITE_OTHER): Payer: Medicare Other

## 2022-04-07 ENCOUNTER — Ambulatory Visit (INDEPENDENT_AMBULATORY_CARE_PROVIDER_SITE_OTHER): Payer: Medicare Other | Admitting: Nurse Practitioner

## 2022-04-07 VITALS — BP 140/79 | HR 76 | Resp 16 | Wt 219.0 lb

## 2022-04-07 DIAGNOSIS — I723 Aneurysm of iliac artery: Secondary | ICD-10-CM

## 2022-04-07 DIAGNOSIS — E118 Type 2 diabetes mellitus with unspecified complications: Secondary | ICD-10-CM

## 2022-04-07 DIAGNOSIS — J449 Chronic obstructive pulmonary disease, unspecified: Secondary | ICD-10-CM

## 2022-04-07 DIAGNOSIS — I10 Essential (primary) hypertension: Secondary | ICD-10-CM

## 2022-04-07 NOTE — Progress Notes (Signed)
Subjective:    Patient ID: Molly Hoover, female    DOB: 1945-06-24, 76 y.o.   MRN: [id number] Chief Complaint  Patient presents with   Follow-up    Ultrasound follow up    Patient returns today in follow up of her iliac artery aneurysms.  Currently she denies any aneurysm related symptoms.  Specifically there is no new back pain or abdominal pain.  There is no signs symptoms of peripheral embolization.  The patient's duplex today shows 1.5 cm iliac artery aneurysms.  This is identical to her previous studies done in 2021.  There is no significant change noted.  Currently no aortic aneurysm noted.  These are considered just into the aneurysmal range and currently pose no threat imminently.     Review of Systems  Cardiovascular:  Negative for leg swelling.  Musculoskeletal:  Negative for back pain.  Skin:  Negative for color change.  All other systems reviewed and are negative.      Objective:   Physical Exam Vitals reviewed.  HENT:     Head: Normocephalic.  Cardiovascular:     Rate and Rhythm: Normal rate.     Pulses:          Dorsalis pedis pulses are 2+ on the right side and 2+ on the left side.  Pulmonary:     Effort: Pulmonary effort is normal.  Skin:    General: Skin is warm and dry.  Neurological:     Mental Status: She is alert and oriented to person, place, and time.  Psychiatric:        Mood and Affect: Mood normal.        Behavior: Behavior normal.        Thought Content: Thought content normal.        Judgment: Judgment normal.     BP (!) 140/79 (BP Location: Left Arm)   Pulse 76   Resp 16   Wt 219 lb (99.3 kg)   BMI 40.06 kg/m   Past Medical History:  Diagnosis Date   Allergic rhinitis    Anxiety    Asthma    Bilateral breast cysts    Contusion of left knee 10/06/2015   COPD (chronic obstructive pulmonary disease) (HCC)    Degenerative disc disease    Depression    Diabetes mellitus without complication (HCC)    Diabetic peripheral  neuropathy (HCC)    Diverticulosis    Enlarged heart    GERD (gastroesophageal reflux disease)    Hiatal hernia    Hyperlipidemia    Hypertension    Hypothyroidism    Iliac artery aneurysm, bilateral ([place]) 12/15/2015   1.8 and 1.6 noted on [place] 11/2015 - repeat US showed no aneurysm   PVD (peripheral vascular disease) (HCC)    Syncope and collapse    Vitamin D deficiency     Social History   Socioeconomic History   Marital status: Married    Spouse name: Not on file   Number of children: 1   Years of education: Not on file   Highest education level: 11th grade  Occupational History   Occupation: Retired  Tobacco Use   Smoking status: Former    Packs/day: 2.00    Years: 15.00    Total pack years: 30.00    Types: Cigarettes    Quit date: 1989    Years since quitting: 34.9   Smokeless tobacco: Never   Tobacco comments:    smoking cessation materials not required  Vaping Use  Vaping Use: Never used  Substance and Sexual Activity   Alcohol use: Yes    Comment: rare   Drug use: No   Sexual activity: Not Currently    Partners: Male  Other Topics Concern   Not on file  Social History Narrative   Not on file   Social Determinants of Health   Financial Resource Strain: Low Risk  (09/16/2021)   Overall Financial Resource Strain (CARDIA)    Difficulty of Paying Living Expenses: Not hard at all  Food Insecurity: No Food Insecurity (09/16/2021)   Hunger Vital Sign    Worried About Running Out of Food in the Last Year: Never true    Ran Out of Food in the Last Year: Never true  Transportation Needs: No Transportation Needs (09/16/2021)   PRAPARE - Hydrologist (Medical): No    Lack of Transportation (Non-Medical): No  Physical Activity: Inactive (01/09/2018)   Exercise Vital Sign    Days of Exercise per Week: 0 days    Minutes of Exercise per Session: 0 min  Stress: No Stress Concern Present (01/09/2018)   [place]    Feeling of Stress : Not at all  Social Connections: Unknown (01/09/2018)   Social Connection and Isolation Panel [NHANES]    Frequency of Communication with Friends and Family: Patient refused    Frequency of Social Gatherings with Friends and Family: Patient refused    Attends Religious Services: Patient refused    Active Member of Clubs or Organizations: Patient refused    Attends Archivist Meetings: Patient refused    Marital Status: Married  Human resources officer Violence: Not At Risk (09/16/2021)   Humiliation, Afraid, Rape, and Kick questionnaire    Fear of Current or Ex-Partner: No    Emotionally Abused: No    Physically Abused: No    Sexually Abused: No    Past Surgical History:  Procedure Laterality Date   BREAST BIOPSY     CARPAL TUNNEL RELEASE     right   CERVICAL FUSION  2002   CHOLECYSTECTOMY     ESOPHAGOGASTRODUODENOSCOPY  03/2014   benign stricture dilated   HERNIA REPAIR     LUMBAR FUSION     L1-4    REPLACEMENT TOTAL KNEE Left 12/22/208   TOTAL ABDOMINAL HYSTERECTOMY      Family History  Problem Relation Age of Onset   Heart attack Mother 65   Hypertension Mother    Heart attack Father     Allergies  Allergen Reactions   Codeine Hives and Itching   Combivent [Ipratropium-Albuterol] Itching   Ivp Dye [Iodinated Contrast Media] Hives and Itching   Meloxicam        Latest Ref Rng & Units 01/29/2022   11:54 AM 01/15/2021   11:15 AM 01/02/2020   10:46 AM  CBC  WBC 3.4 - 10.8 x10E3/uL 4.6  4.8  5.0   Hemoglobin 11.1 - 15.9 g/dL 12.6  12.8  13.4   Hematocrit 34.0 - 46.6 % 39.2  38.7  40.4   Platelets 150 - 450 x10E3/uL 193  201  194       CMP     Component Value Date/Time   NA 140 01/29/2022 1154   K 5.0 01/29/2022 1154   CL 101 01/29/2022 1154   CO2 22 01/29/2022 1154   GLUCOSE 119 (H) 01/29/2022 1154   GLUCOSE 241 (H) 12/24/2014 1146   BUN 21 01/29/2022 1154  CREATININE 0.82 01/29/2022  1154   CALCIUM 10.0 01/29/2022 1154   PROT 7.1 01/29/2022 1154   ALBUMIN 4.7 01/29/2022 1154   AST 23 01/29/2022 1154   ALT 18 01/29/2022 1154   ALKPHOS 80 01/29/2022 1154   BILITOT 0.5 01/29/2022 1154   GFRNONAA 76 01/02/2020 1046   GFRAA 88 01/02/2020 1046     No results found.     Assessment & Plan:   1. Iliac artery aneurysm (HCC)  Duplex today shows stable size of her iliac arteries bilaterally with maximal diameters of 1.5 cm in both the right and left common iliac arteries. These would be just into the aneurysmal range and pose her no threat imminently. We can continue to follow these on every other year basis.  2. Type II diabetes mellitus with complication ([place]) Continue hypoglycemic medications as already ordered, these medications have been reviewed and there are no changes at this time.  Hgb A1C to be monitored as already arranged by primary service  3. Essential hypertension Continue antihypertensive medications as already ordered, these medications have been reviewed and there are no changes at this time.  4. COPD, moderate ([place]) Continue pulmonary medications and aerosols as already ordered, these medications have been reviewed and there are no changes at this time.    Current Outpatient Medications on File Prior to Visit  Medication Sig Dispense Refill   albuterol (VENTOLIN HFA) 108 (90 Base) MCG/ACT inhaler Inhale 2 puffs into the lungs every 4 (four) hours as needed for wheezing or shortness of breath. 1 each 5   alendronate (FOSAMAX) 70 MG tablet TAKE ONE TABLET EACH WEEK 12 tablet 0   atorvastatin (LIPITOR) 20 MG tablet TAKE 1 TABLET BY MOUTH AT BEDTIME 90 tablet 0   BREO ELLIPTA 200-25 MCG/ACT AEPB INHALE 1 INHALATION BY MOUTH DAILY. 60 each 0   Cranberry 500 MG CAPS Take 500 mg by mouth daily.      Dulaglutide (TRULICITY) 1.5 ZO/1.0RU SOPN ADMINISTER 1.5 MG SUBCUTANEOUSLY ONCE A WEEK 6 mL 1   gemfibrozil (LOPID) 600 MG tablet TAKE ONE (1) TABLET BY  MOUTH TWO TIMES PER DAY 180 tablet 0   glucose blood (ONE TOUCH ULTRA TEST) test strip Use 2 times per day to test blood sugar. 200 each 3   insulin aspart (NOVOLOG) 100 UNIT/ML FlexPen Inject 25 Units into the skin 3 (three) times daily with meals. 90 mL 1   Insulin Pen Needle (B-D UF III MINI PEN NEEDLES) 31G X 5 MM MISC USE AND DISCARD 1 PEN      NEEDLE 4 TIMES A DAY 360 each 3   LEVEMIR FLEXPEN 100 UNIT/ML FlexPen INJECT 40 UNITS UNDER THE SKIN IN THE MORNING AND 30 UNITS IN THE EVENING 30 mL 0   losartan (COZAAR) 25 MG tablet TAKE 1 TABLET BY MOUTH DAILY 90 tablet 0   metFORMIN (GLUCOPHAGE) 500 MG tablet TAKE ONE (1) TABLET BY MOUTH TWO TIMES PER DAY 180 tablet 0   omeprazole (PRILOSEC) 20 MG capsule TAKE 1 CAPSULE BY MOUTH ONCE DAILY 90 capsule 0   OneTouch Delica Lancets 04V MISC 1 each by Does not apply route 2 (two) times daily. 200 each 3   triamterene-hydrochlorothiazide (MAXZIDE-25) 37.5-25 MG tablet TAKE 1 TABLET BY MOUTH DAILY 90 tablet 0   Vitamin D, Ergocalciferol, (DRISDOL) 1.25 MG (50000 UNIT) CAPS capsule TAKE ONE CAPSULE TWICE A WEEK 24 capsule 4   Current Facility-Administered Medications on File Prior to Visit  Medication Dose Route Frequency Provider Last  Rate Last Admin   albuterol (PROVENTIL) (2.5 MG/3ML) 0.083% nebulizer solution 2.5 mg  2.5 mg Nebulization Once Glean Hess, MD        There are no Patient Instructions on file for this visit. No follow-ups on file.   Kris Hartmann, NP

## 2022-04-12 ENCOUNTER — Other Ambulatory Visit: Payer: Self-pay | Admitting: Internal Medicine

## 2022-04-27 ENCOUNTER — Other Ambulatory Visit: Payer: Self-pay | Admitting: Internal Medicine

## 2022-04-27 DIAGNOSIS — I10 Essential (primary) hypertension: Secondary | ICD-10-CM

## 2022-04-27 DIAGNOSIS — J449 Chronic obstructive pulmonary disease, unspecified: Secondary | ICD-10-CM

## 2022-05-10 ENCOUNTER — Telehealth: Payer: Self-pay | Admitting: Internal Medicine

## 2022-05-10 NOTE — Telephone Encounter (Signed)
Spoke with patient to sched AWV she declined and do not call.  She stated she will not change her mind.

## 2022-05-24 ENCOUNTER — Other Ambulatory Visit: Payer: Self-pay | Admitting: Internal Medicine

## 2022-05-24 DIAGNOSIS — E118 Type 2 diabetes mellitus with unspecified complications: Secondary | ICD-10-CM

## 2022-05-28 ENCOUNTER — Other Ambulatory Visit: Payer: Self-pay | Admitting: Internal Medicine

## 2022-05-28 DIAGNOSIS — E118 Type 2 diabetes mellitus with unspecified complications: Secondary | ICD-10-CM

## 2022-05-28 NOTE — Telephone Encounter (Signed)
Unable to refill per protocol, Rx request is too soon. Last refill 05/24/22. Will refuse duplicate request.  Requested Prescriptions  Pending Prescriptions Disp Refills   insulin detemir (LEVEMIR FLEXPEN) 100 UNIT/ML FlexPen 30 mL 3     Endocrinology:  Diabetes - Insulins Passed - 05/28/2022 10:09 AM      Passed - HBA1C is between 0 and 7.9 and within 180 days    Hgb A1c MFr Bld  Date Value Ref Range Status  01/29/2022 6.2 (H) 4.8 - 5.6 % Final    Comment:             Prediabetes: 5.7 - 6.4          Diabetes: >6.4          Glycemic control for adults with diabetes: <7.0          Passed - Valid encounter within last 6 months    Recent Outpatient Visits           2 months ago COPD with acute exacerbation [facility])   [place] [facility] at [place], Jesse Sans, MD   3 months ago Annual physical exam   [place] at [facility], Jesse Sans, MD   8 months ago Type II diabetes mellitus with complication [facility])   [place] at [facility], Jesse Sans, MD   1 year ago Essential hypertension   [place] at [facility], Jesse Sans, MD   1 year ago Annual physical exam   [place] at [facility], Jesse Sans, MD       Future Appointments             In 5 days Army Melia Jesse Sans, MD [place] at [facility], [facility]   In 8 months Army Melia, Jesse Sans, MD [place] at [facility], [facility]

## 2022-05-28 NOTE — Telephone Encounter (Signed)
Medication Refill - Medication: insulin detemir (LEVEMIR FLEXPEN) 100 UNIT/ML FlexPen   Also requesting Humalog   Has the patient contacted their pharmacy? Yes.   (Agent: If no, request that the patient contact the pharmacy for the refill. If patient does not wish to contact the pharmacy document the reason why and proceed with request.) (Agent: If yes, when and what did the pharmacy advise?)  Preferred Pharmacy (with phone number or street name):   [place], [place] - [address]  [place] [address]  Phone: [phone] Fax: [phone]    Has the patient been seen for an appointment in the last year OR does the patient have an upcoming appointment? Yes.    Agent: Please be advised that RX refills may take up to 3 business days. We ask that you follow-up with your pharmacy.

## 2022-06-02 ENCOUNTER — Ambulatory Visit (INDEPENDENT_AMBULATORY_CARE_PROVIDER_SITE_OTHER): Payer: HMO | Admitting: Internal Medicine

## 2022-06-02 ENCOUNTER — Encounter: Payer: Self-pay | Admitting: Internal Medicine

## 2022-06-02 ENCOUNTER — Other Ambulatory Visit: Payer: Self-pay | Admitting: Internal Medicine

## 2022-06-02 VITALS — BP 126/78 | HR 72 | Ht 62.0 in | Wt 230.4 lb

## 2022-06-02 DIAGNOSIS — E118 Type 2 diabetes mellitus with unspecified complications: Secondary | ICD-10-CM

## 2022-06-02 DIAGNOSIS — E1169 Type 2 diabetes mellitus with other specified complication: Secondary | ICD-10-CM | POA: Diagnosis not present

## 2022-06-02 DIAGNOSIS — E785 Hyperlipidemia, unspecified: Secondary | ICD-10-CM

## 2022-06-02 DIAGNOSIS — I10 Essential (primary) hypertension: Secondary | ICD-10-CM | POA: Diagnosis not present

## 2022-06-02 DIAGNOSIS — J449 Chronic obstructive pulmonary disease, unspecified: Secondary | ICD-10-CM

## 2022-06-02 DIAGNOSIS — Z23 Encounter for immunization: Secondary | ICD-10-CM | POA: Diagnosis not present

## 2022-06-02 LAB — POCT GLYCOSYLATED HEMOGLOBIN (HGB A1C): Hemoglobin A1C: 8.3 % — AB (ref 4.0–5.6)

## 2022-06-02 MED ORDER — INSULIN ASPART 100 UNIT/ML FLEXPEN: 25.0000 [IU] | PEN_INJECTOR | Freq: Three times a day (TID) | SUBCUTANEOUS | 1 refills | Status: DC

## 2022-06-02 NOTE — Assessment & Plan Note (Signed)
BP controlled, No renal or cardiac symptoms  Continue losartan and hctz

## 2022-06-02 NOTE — Assessment & Plan Note (Signed)
Tolerating statin medication without side effects at this time. Atorvastatin 20 mg, lopid LDL is  Lab Results  Component Value Date   LDLCALC 77 01/29/2022   with a goal of < 70. Current dose is appropriate but will be adjusted if needed.

## 2022-06-02 NOTE — Assessment & Plan Note (Addendum)
BS not controlled on metformin, trulicity and insulin due to dietary changes. A1C 8.2 up from 6.6 She will make improvements to get her A1C back in desired range. Eye exam up to date GFR normal.

## 2022-06-02 NOTE — Progress Notes (Signed)
Date:  06/02/2022   Name:  Molly Hoover   DOB:  09/13/1945   MRN:  [id number]   Chief Complaint: Diabetes  Hypertension This is a chronic problem. The problem is controlled. Pertinent negatives include no chest pain, headaches, palpitations or shortness of breath. Past treatments include angiotensin blockers and diuretics. The current treatment provides significant improvement. There is no history of CVA.  Diabetes She presents for her follow-up diabetic visit. She has type 2 diabetes mellitus. Her disease course has been stable. Pertinent negatives for hypoglycemia include no headaches or tremors. Pertinent negatives for diabetes include no chest pain, no fatigue, no polydipsia and no polyuria. Pertinent negatives for diabetic complications include no CVA, heart disease or nephropathy. Current diabetic treatments: metformin, insulin, trulicity.  She has gained a few pounds and admits that she has had some dietary indiscretion since her husband passed.    Lab Results  Component Value Date   NA 140 01/29/2022   K 5.0 01/29/2022   CO2 22 01/29/2022   GLUCOSE 119 (H) 01/29/2022   BUN 21 01/29/2022   CREATININE 0.82 01/29/2022   CALCIUM 10.0 01/29/2022   EGFR 74 01/29/2022   GFRNONAA 76 01/02/2020   Lab Results  Component Value Date   CHOL 149 01/29/2022   HDL 56 01/29/2022   LDLCALC 77 01/29/2022   TRIG 82 01/29/2022   CHOLHDL 2.7 01/29/2022   Lab Results  Component Value Date   TSH 2.720 01/29/2022   Lab Results  Component Value Date   HGBA1C 8.3 (A) 06/02/2022   Lab Results  Component Value Date   WBC 4.6 01/29/2022   HGB 12.6 01/29/2022   HCT 39.2 01/29/2022   MCV 92 01/29/2022   PLT 193 01/29/2022   Lab Results  Component Value Date   ALT 18 01/29/2022   AST 23 01/29/2022   ALKPHOS 80 01/29/2022   BILITOT 0.5 01/29/2022   Lab Results  Component Value Date   VD25OH 77.8 01/29/2022     Review of Systems  Constitutional:  Negative for appetite  change, fatigue, fever and unexpected weight change.  HENT:  Negative for tinnitus and trouble swallowing.   Eyes:  Negative for visual disturbance.  Respiratory:  Negative for cough, chest tightness and shortness of breath.   Cardiovascular:  Negative for chest pain, palpitations and leg swelling.  Gastrointestinal:  Negative for abdominal pain.  Endocrine: Negative for polydipsia and polyuria.  Genitourinary:  Negative for dysuria and hematuria.  Musculoskeletal:  Negative for arthralgias.  Neurological:  Negative for tremors, numbness and headaches.  Psychiatric/Behavioral:  Negative for dysphoric mood.     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   Abdominal aortic ectasia ([place]) 12/11/2015   Osteoarthritis of right knee 12/10/2015   Arthritis of hand 10/06/2015   COPD, moderate ([place]) 11/19/2014   Edema extremities 11/19/2014   Esophagitis determined by endoscopy 11/19/2014   Hyperlipidemia associated with type 2 diabetes mellitus ([place]) 11/19/2014   Idiopathic osteoporosis 11/19/2014   Tobacco abuse, 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   Venous insufficiency of leg 11/19/2014   Type II diabetes mellitus with complication ([place]) 97/67/3419   Morbid obesity ([place]) 10/17/2013   Essential hypertension 10/17/2013   Chronic cough 10/17/2013    Allergies  Allergen Reactions   Codeine Hives and Itching  Combivent [Ipratropium-Albuterol] Itching   Ivp Dye [Iodinated Contrast Media] Hives and Itching   Meloxicam     Past Surgical History:  Procedure Laterality Date   BREAST BIOPSY     CARPAL TUNNEL RELEASE     right   CERVICAL FUSION  2002   CHOLECYSTECTOMY     ESOPHAGOGASTRODUODENOSCOPY  03/2014   benign stricture dilated   HERNIA REPAIR     LUMBAR FUSION     L1-4    REPLACEMENT TOTAL  KNEE Left 12/22/208   TOTAL ABDOMINAL HYSTERECTOMY      Social History   Tobacco Use   Smoking status: Former    Packs/day: 2.00    Years: 15.00    Total pack years: 30.00    Types: Cigarettes    Quit date: 1989    Years since quitting: 35.1   Smokeless tobacco: Never   Tobacco comments:    smoking cessation materials not required  Vaping Use   Vaping Use: Never used  Substance Use Topics   Alcohol use: Yes    Comment: rare   Drug use: No     Medication list has been reviewed and updated.  Current Meds  Medication Sig   albuterol (VENTOLIN HFA) 108 (90 Base) MCG/ACT inhaler Inhale 2 puffs into the lungs every 4 (four) hours as needed for wheezing or shortness of breath.   alendronate (FOSAMAX) 70 MG tablet TAKE ONE TABLET EACH WEEK   atorvastatin (LIPITOR) 20 MG tablet TAKE 1 TABLET BY MOUTH AT BEDTIME   Cranberry 500 MG CAPS Take 500 mg by mouth daily.    Dulaglutide (TRULICITY) 1.5 BB/0.4UG SOPN ADMINISTER 1.5 MG SUBCUTANEOUSLY ONCE A WEEK   fluticasone furoate-vilanterol (BREO ELLIPTA) 200-25 MCG/ACT AEPB INHALE 1 INHALATION BY MOUTH DAILY.   gemfibrozil (LOPID) 600 MG tablet TAKE ONE (1) TABLET BY MOUTH TWO TIMES PER DAY   glucose blood (ONE TOUCH ULTRA TEST) test strip Use 2 times per day to test blood sugar.   insulin detemir (LEVEMIR FLEXPEN) 100 UNIT/ML FlexPen INJECT 40 UNITS UNDER THE SKIN IN THE MORNING AND 30 UNITS IN THE EVENING   Insulin Pen Needle (B-D UF III MINI PEN NEEDLES) 31G X 5 MM MISC USE AND DISCARD 1 PEN      NEEDLE 4 TIMES A DAY   losartan (COZAAR) 25 MG tablet TAKE 1 TABLET BY MOUTH DAILY   metFORMIN (GLUCOPHAGE) 500 MG tablet TAKE ONE (1) TABLET BY MOUTH TWO TIMES PER DAY   omeprazole (PRILOSEC) 20 MG capsule TAKE 1 CAPSULE BY MOUTH ONCE DAILY   OneTouch Delica Lancets 89V MISC 1 each by Does not apply route 2 (two) times daily.   triamterene-hydrochlorothiazide (MAXZIDE-25) 37.5-25 MG tablet TAKE 1 TABLET BY MOUTH DAILY   Vitamin D,  Ergocalciferol, (DRISDOL) 1.25 MG (50000 UNIT) CAPS capsule TAKE ONE CAPSULE TWICE A WEEK   [DISCONTINUED] insulin aspart (NOVOLOG) 100 UNIT/ML FlexPen Inject 25 Units into the skin 3 (three) times daily with meals.   Current Facility-Administered Medications for the 06/02/22 encounter (Office Visit) with Glean Hess, MD  Medication   albuterol (PROVENTIL) (2.5 MG/3ML) 0.083% nebulizer solution 2.5 mg       06/02/2022   11:09 AM 03/22/2022   10:44 AM 01/29/2022   11:06 AM 09/16/2021   10:12 AM  GAD 7 : Generalized Anxiety Score  Nervous, Anxious, on Edge 0 0 0 0  Control/stop worrying 0 0 0 0  Worry too much - different things 0 0 0 0  Trouble relaxing  0 1 0 0  Restless 0 0 0 0  Easily annoyed or irritable 0 0 0 0  Afraid - awful might happen 0 0 0 0  Total GAD 7 Score 0 1 0 0  Anxiety Difficulty Not difficult at all Not difficult at all Not difficult at all Not difficult at all       06/02/2022   11:09 AM 03/22/2022   10:44 AM 01/29/2022   11:06 AM  Depression screen PHQ 2/9  Decreased Interest 0 0 0  Down, Depressed, Hopeless 0 0 0  PHQ - 2 Score 0 0 0  Altered sleeping 0 2 0  Tired, decreased energy 0 2 0  Change in appetite 0 2 0  Feeling bad or failure about yourself  0 0 0  Trouble concentrating 0 0 0  Moving slowly or fidgety/restless 0 0 0  Suicidal thoughts 0 0 0  PHQ-9 Score 0 6 0  Difficult doing work/chores Not difficult at all Not difficult at all Not difficult at all    BP Readings from Last 3 Encounters:  06/02/22 126/78  04/07/22 (!) 140/79  03/22/22 128/78    Physical Exam Vitals and nursing note reviewed.  Constitutional:      General: She is not in acute distress.    Appearance: She is well-developed.  HENT:     Head: Normocephalic and atraumatic.  Cardiovascular:     Rate and Rhythm: Normal rate and regular rhythm.  Pulmonary:     Effort: Pulmonary effort is normal. No respiratory distress.     Breath sounds: No wheezing or rhonchi.   Musculoskeletal:     Right lower leg: No edema.     Left lower leg: No edema.  Lymphadenopathy:     Cervical: No cervical adenopathy.  Skin:    General: Skin is warm and dry.     Findings: No rash.  Neurological:     Mental Status: She is alert and oriented to person, place, and time.  Psychiatric:        Mood and Affect: Mood normal.        Behavior: Behavior normal.     Wt Readings from Last 3 Encounters:  06/02/22 230 lb 6.4 oz (104.5 kg)  04/07/22 219 lb (99.3 kg)  03/22/22 227 lb 6.4 oz (103.1 kg)    BP 126/78   Pulse 72   Ht '5\' 2"'$  (1.575 m)   Wt 230 lb 6.4 oz (104.5 kg)   SpO2 98%   BMI 42.14 kg/m   Assessment and Plan: Problem List Items Addressed This Visit       Cardiovascular and Mediastinum   Essential hypertension - Primary (Chronic)    BP controlled, No renal or cardiac symptoms  Continue losartan and hctz        Respiratory   COPD, moderate (HCC) (Chronic)     Endocrine   Hyperlipidemia associated with type 2 diabetes mellitus ([place]) (Chronic)    Tolerating statin medication without side effects at this time. Atorvastatin 20 mg, lopid LDL is  Lab Results  Component Value Date   LDLCALC 77 01/29/2022  with a goal of < 70. Current dose is appropriate but will be adjusted if needed.       Relevant Medications   insulin aspart (NOVOLOG) 100 UNIT/ML FlexPen   Type II diabetes mellitus with complication (HCC) (Chronic)    BS not controlled on metformin, trulicity and insulin due to dietary changes. A1C 8.2 up from 6.6 She will make  improvements to get her A1C back in desired range. Eye exam up to date GFR normal.      Relevant Medications   insulin aspart (NOVOLOG) 100 UNIT/ML FlexPen   Other Relevant Orders   POCT glycosylated hemoglobin (Hb A1C) (Completed)   Other Visit Diagnoses     Need for vaccination for pneumococcus       Relevant Orders   Pneumococcal conjugate vaccine 20-valent (Completed)        Partially dictated  using Editor, commissioning. Any errors are unintentional.  Halina Maidens, MD [facility]  06/02/2022

## 2022-06-04 ENCOUNTER — Telehealth: Payer: Self-pay | Admitting: Internal Medicine

## 2022-06-04 ENCOUNTER — Other Ambulatory Visit: Payer: Self-pay

## 2022-06-04 DIAGNOSIS — E118 Type 2 diabetes mellitus with unspecified complications: Secondary | ICD-10-CM

## 2022-06-04 MED ORDER — INSULIN ASPART 100 UNIT/ML FLEXPEN: 25.0000 [IU] | PEN_INJECTOR | Freq: Three times a day (TID) | SUBCUTANEOUS | 1 refills | Status: DC

## 2022-06-04 NOTE — Telephone Encounter (Signed)
Pt states that she was seen by PCP on Wed and was told that her medication would be sent to the Pharmacy. Pt states her Pharmacy still does not have the prescription. insulin aspart (NOVOLOG) 100 UNIT/ML FlexPen   Pt is wanting to know if the medication could be sent to her Pharmacy.  Please advise  [facility] - [place], [place] Phone: [phone]  Fax: [phone]

## 2022-06-04 NOTE — Telephone Encounter (Signed)
Rx refilled.  Molly Hoover

## 2022-06-07 ENCOUNTER — Other Ambulatory Visit: Payer: Self-pay

## 2022-06-07 ENCOUNTER — Ambulatory Visit: Payer: Self-pay | Admitting: *Deleted

## 2022-06-07 DIAGNOSIS — E118 Type 2 diabetes mellitus with unspecified complications: Secondary | ICD-10-CM

## 2022-06-07 MED ORDER — INSULIN ASPART 100 UNIT/ML FLEXPEN: 25.0000 [IU] | PEN_INJECTOR | Freq: Three times a day (TID) | SUBCUTANEOUS | 1 refills | Status: AC

## 2022-06-07 MED ORDER — INSULIN ASPART 100 UNIT/ML FLEXPEN: 25.0000 [IU] | PEN_INJECTOR | Freq: Three times a day (TID) | SUBCUTANEOUS | 1 refills | Status: DC

## 2022-06-07 NOTE — Telephone Encounter (Signed)
Reason for Disposition  [1] Caller has URGENT medicine question about med that PCP or specialist prescribed AND [2] triager unable to answer question    Rx written as a "No print"  Answer Assessment - Initial Assessment Questions 1. NAME of MEDICINE: "What medicine(s) are you calling about?"     Novolog Insulin 100 units/ml Flexpen 2. QUESTION: "What is your question?" (e.g., double dose of medicine, side effect)     It's not at the pharmacy.   [facility].   Dr. Army Melia was supposed to send it in Friday but the pharmacy still has not received it. 3. PRESCRIBER: "Who prescribed the medicine?" Reason: if prescribed by specialist, call should be referred to that group.     Dr. Army Melia 4. SYMPTOMS: "Do you have any symptoms?" If Yes, ask: "What symptoms are you having?"  "How bad are the symptoms (e.g., mild, moderate, severe)     N/A 5. PREGNANCY:  "Is there any chance that you are pregnant?" "When was your last menstrual period?"     N/A  Protocols used: Medication Question Call-A-AH

## 2022-06-07 NOTE — Telephone Encounter (Signed)
  Chief Complaint: Novolog Insulin 100 unit/ml Flexpen is not at [facility].   Was sent Friday. Symptoms: N/A Frequency: N/A Pertinent Negatives: Patient denies N/A Disposition: '[]'$ ED /'[]'$ Urgent Care (no appt availability in office) / '[]'$ Appointment(In office/virtual)/ '[]'$  [facility]/ '[]'$ Home Care/ '[]'$ Refused Recommended Disposition /'[]'$  Mobile Bus/ '[x]'$  Follow-up with PCP Additional Notes: Rx was sent on 06/04/2022 as a "No Print" so pharmacy has not received it.     I attempted to resend it electronically but due to the "No Print" it will not let me. Sending to Dr. Army Melia to see if it can be sent electronically.

## 2022-06-09 ENCOUNTER — Other Ambulatory Visit: Payer: Self-pay | Admitting: Internal Medicine

## 2022-06-09 DIAGNOSIS — I10 Essential (primary) hypertension: Secondary | ICD-10-CM

## 2022-06-09 DIAGNOSIS — K209 Esophagitis, unspecified without bleeding: Secondary | ICD-10-CM

## 2022-06-09 DIAGNOSIS — E118 Type 2 diabetes mellitus with unspecified complications: Secondary | ICD-10-CM

## 2022-07-05 ENCOUNTER — Ambulatory Visit
Admission: RE | Admit: 2022-07-05 | Discharge: 2022-07-05 | Disposition: A | Discharge status: Home / Self Care | Payer: PPO | Source: Ambulatory Visit | Attending: Internal Medicine | Admitting: Internal Medicine

## 2022-07-05 DIAGNOSIS — M818 Other osteoporosis without current pathological fracture: Secondary | ICD-10-CM | POA: Insufficient documentation

## 2022-07-05 DIAGNOSIS — Z1231 Encounter for screening mammogram for malignant neoplasm of breast: Secondary | ICD-10-CM | POA: Insufficient documentation

## 2022-07-05 DIAGNOSIS — M85852 Other specified disorders of bone density and structure, left thigh: Secondary | ICD-10-CM | POA: Diagnosis not present

## 2022-07-06 ENCOUNTER — Inpatient Hospital Stay
Admission: RE | Admit: 2022-07-06 | Discharge: 2022-07-06 | Disposition: A | Discharge status: Home / Self Care | Payer: Self-pay | Source: Ambulatory Visit | Attending: *Deleted | Admitting: *Deleted

## 2022-07-06 ENCOUNTER — Other Ambulatory Visit: Payer: Self-pay | Admitting: *Deleted

## 2022-07-06 DIAGNOSIS — Z1231 Encounter for screening mammogram for malignant neoplasm of breast: Secondary | ICD-10-CM

## 2022-07-08 ENCOUNTER — Telehealth: Payer: Self-pay

## 2022-07-08 NOTE — Telephone Encounter (Signed)
Called pt to schedule AWV. Pt stated she was "not interested".  KP

## 2022-07-20 ENCOUNTER — Telehealth: Payer: Self-pay

## 2022-07-20 NOTE — Telephone Encounter (Signed)
Completed PA on [email or web address] for Trulicity 1.5 MG.  PA Case ID #: [id number] Rx #: [id number]  Awaiting outcome.

## 2022-07-22 ENCOUNTER — Ambulatory Visit (INDEPENDENT_AMBULATORY_CARE_PROVIDER_SITE_OTHER): Payer: HMO

## 2022-07-22 ENCOUNTER — Other Ambulatory Visit: Payer: Self-pay

## 2022-07-22 VITALS — Ht 62.0 in | Wt 230.4 lb

## 2022-07-22 DIAGNOSIS — Z0000 Encounter for general adult medical examination without abnormal findings: Secondary | ICD-10-CM | POA: Diagnosis not present

## 2022-07-22 DIAGNOSIS — E118 Type 2 diabetes mellitus with unspecified complications: Secondary | ICD-10-CM

## 2022-07-22 MED ORDER — TRULICITY 1.5 MG/0.5ML ~~LOC~~ SOAJ: SUBCUTANEOUS | 1 refills | Status: DC

## 2022-07-22 NOTE — Telephone Encounter (Signed)
PA Approved for 1 year.  Molly Hoover

## 2022-07-22 NOTE — Progress Notes (Signed)
Subjective:   Molly Hoover is a 77 y.o. female who presents for Medicare Annual (Subsequent) preventive examination. I connected with  Molly Hoover on 07/22/22 by a audio enabled telemedicine application and verified that I am speaking with the correct person using two identifiers.  Patient Location: Home  Provider Location: Office/Clinic  I discussed the limitations of evaluation and management by telemedicine. The patient expressed understanding and agreed to proceed.    Review of Systems    Defer to PCP  Cardiac Risk Factors include: advanced age (>63men, >18 women);obesity (BMI >30kg/m2);diabetes mellitus     Objective:    Today's Vitals   07/22/22 1106  Weight: 230 lb 6.4 oz (104.5 kg)  Height: 5\' 2"  (1.575 m)  PainSc: 0-No pain   Body mass index is 42.14 kg/m.     07/22/2022   11:10 AM 01/09/2018    8:52 AM 02/04/2017    8:52 AM 01/03/2017   10:15 AM 12/17/2016   11:33 AM 08/06/2016    9:14 AM 12/19/2014    4:31 PM  Advanced Directives  Does Patient Have a Medical Advance Directive? No No No No No No No  Would patient like information on creating a medical advance directive? No - Patient declined No - Patient declined  No - Patient declined   No - patient declined information    Current Medications (verified) Outpatient Encounter Medications as of 07/22/2022  Medication Sig   albuterol (VENTOLIN HFA) 108 (90 Base) MCG/ACT inhaler Inhale 2 puffs into the lungs every 4 (four) hours as needed for wheezing or shortness of breath.   alendronate (FOSAMAX) 70 MG tablet TAKE ONE TABLET EACH WEEK   atorvastatin (LIPITOR) 20 MG tablet TAKE 1 TABLET BY MOUTH AT BEDTIME   Cranberry 500 MG CAPS Take 500 mg by mouth daily.    Dulaglutide (TRULICITY) 1.5 0000000 SOPN ADMINISTER 1.5 MG SUBCUTANEOUSLY ONCE A WEEK   fluticasone furoate-vilanterol (BREO ELLIPTA) 200-25 MCG/ACT AEPB INHALE 1 INHALATION BY MOUTH DAILY.   gemfibrozil (LOPID) 600 MG tablet TAKE ONE  (1) TABLET BY MOUTH TWO TIMES PER DAY   glucose blood (ONE TOUCH ULTRA TEST) test strip Use 2 times per day to test blood sugar.   insulin aspart (NOVOLOG) 100 UNIT/ML FlexPen Inject 25 Units into the skin 3 (three) times daily with meals.   insulin detemir (LEVEMIR FLEXPEN) 100 UNIT/ML FlexPen INJECT 40 UNITS UNDER THE SKIN IN THE MORNING AND 30 UNITS IN THE EVENING   Insulin Pen Needle (B-D UF III MINI PEN NEEDLES) 31G X 5 MM MISC USE AND DISCARD 1 PEN      NEEDLE 4 TIMES A DAY   losartan (COZAAR) 25 MG tablet TAKE 1 TABLET BY MOUTH DAILY   metFORMIN (GLUCOPHAGE) 500 MG tablet TAKE ONE (1) TABLET BY MOUTH TWO TIMES PER DAY   omeprazole (PRILOSEC) 20 MG capsule TAKE 1 CAPSULE BY MOUTH ONCE DAILY   OneTouch Delica Lancets 99991111 MISC 1 each by Does not apply route 2 (two) times daily.   triamterene-hydrochlorothiazide (MAXZIDE-25) 37.5-25 MG tablet TAKE 1 TABLET BY MOUTH DAILY   Vitamin D, Ergocalciferol, 50000 units CAPS TAKE ONE CAPSULE TWICE A WEEK   Facility-Administered Encounter Medications as of 07/22/2022  Medication   albuterol (PROVENTIL) (2.5 MG/3ML) 0.083% nebulizer solution 2.5 mg    Allergies (verified) Codeine, Combivent [ipratropium-albuterol], Ivp dye [iodinated contrast media], and Meloxicam   History: Past Medical History:  Diagnosis Date   Allergic rhinitis    Anxiety  Asthma    Bilateral breast cysts    Contusion of left knee 10/06/2015   COPD (chronic obstructive pulmonary disease) (HCC)    Degenerative disc disease    Depression    Diabetes mellitus without complication (HCC)    Diabetic peripheral neuropathy (HCC)    Diverticulosis    Enlarged heart    GERD (gastroesophageal reflux disease)    Hiatal hernia    Hyperlipidemia    Hypertension    Hypothyroidism    Iliac artery aneurysm, bilateral ([place]) 12/15/2015   1.8 and 1.6 noted on [place] 11/2015 - repeat US showed no aneurysm   PVD (peripheral vascular disease) (HCC)    Syncope and collapse    Vitamin D  deficiency    Past Surgical History:  Procedure Laterality Date   BREAST BIOPSY     CARPAL TUNNEL RELEASE     right   CERVICAL FUSION  2002   CHOLECYSTECTOMY     ESOPHAGOGASTRODUODENOSCOPY  03/2014   benign stricture dilated   HERNIA REPAIR     LUMBAR FUSION     L1-4    REPLACEMENT TOTAL KNEE Left 12/22/208   TOTAL ABDOMINAL HYSTERECTOMY     Family History  Problem Relation Age of Onset   Heart attack Mother 69   Hypertension Mother    Heart attack Father    Social History   Socioeconomic History   Marital status: Married    Spouse name: Not on file   Number of children: 1   Years of education: Not on file   Highest education level: 11th grade  Occupational History   Occupation: Retired  Tobacco Use   Smoking status: Former    Packs/day: 2.00    Years: 15.00    Additional pack years: 0.00    Total pack years: 30.00    Types: Cigarettes    Quit date: 1989    Years since quitting: 35.2   Smokeless tobacco: Never   Tobacco comments:    smoking cessation materials not required  Vaping Use   Vaping Use: Never used  Substance and Sexual Activity   Alcohol use: Yes    Comment: rare   Drug use: No   Sexual activity: Not Currently    Partners: Male  Other Topics Concern   Not on file  Social History Narrative   Not on file   Social Determinants of Health   Financial Resource Strain: Low Risk  (07/22/2022)   Overall Financial Resource Strain (CARDIA)    Difficulty of Paying Living Expenses: Not hard at all  Food Insecurity: No Food Insecurity (07/22/2022)   Hunger Vital Sign    Worried About Running Out of Food in the Last Year: Never true    Ran Out of Food in the Last Year: Never true  Transportation Needs: No Transportation Needs (07/22/2022)   PRAPARE - Hydrologist (Medical): No    Lack of Transportation (Non-Medical): No  Physical Activity: Insufficiently Active (07/22/2022)   Exercise Vital Sign    Days of Exercise per  Week: 3 days    Minutes of Exercise per Session: 30 min  Stress: No Stress Concern Present (07/22/2022)   [place]    Feeling of Stress : Not at all  Social Connections: Unknown (01/09/2018)   Social Connection and Isolation Panel [NHANES]    Frequency of Communication with Friends and Family: Patient declined    Frequency of Social Gatherings with Friends and  Family: Patient declined    Attends Religious Services: Patient declined    Active Member of Clubs or Organizations: Patient declined    Attends Archivist Meetings: Patient declined    Marital Status: Married    Tobacco Counseling Counseling given: Not Answered Tobacco comments: smoking cessation materials not required   Clinical Intake:  Pre-visit preparation completed: Yes  Pain : No/denies pain Pain Score: 0-No pain     BMI - recorded: 42.14 Nutritional Status: BMI > 30  Obese Nutritional Risks: None Diabetes: Yes  How often do you need to have someone help you when you read instructions, pamphlets, or other written materials from your doctor or pharmacy?: 1 - Never  Diabetic? Yes.  Interpreter Needed?: No  Information entered by :: Wyatt Haste, [place] of Daily Living    07/22/2022   11:11 AM  In your present state of health, do you have any difficulty performing the following activities:  Hearing? 0  Vision? 0  Difficulty concentrating or making decisions? 0  Walking or climbing stairs? 1  Dressing or bathing? 0  Doing errands, shopping? 0  Preparing Food and eating ? N  Using the Toilet? N  In the past six months, have you accidently leaked urine? N  Do you have problems with loss of bowel control? N  Managing your Medications? N  Managing your Finances? N  Housekeeping or managing your Housekeeping? N    Patient Care Team: Glean Hess, MD as PCP - General (Internal Medicine) Oh, Lupita Dawn, MD  (Inactive) as Consulting Physician (Internal Medicine) Minna Merritts, MD as Consulting Physician (Cardiology) Anell Barr, OD as Consulting Physician (Optometry) Laverle Hobby, MD as Consulting Physician (Pulmonary Disease) Lucky Cowboy Erskine Squibb, MD as Consulting Physician (Vascular Surgery) Brendolyn Patty, MD as Consulting Physician (Dermatology)  Indicate any recent Medical Services you may have received from other than [facility] providers in the past year (date may be approximate).     Assessment:   This is a routine wellness examination for [place].  Hearing/Vision screen No results found.  Dietary issues and exercise activities discussed: Current Exercise Habits: Home exercise routine, Type of exercise: walking, Time (Minutes): 30, Frequency (Times/Week): 3, Weekly Exercise (Minutes/Week): 90, Intensity: Mild   Goals Addressed   None   Depression Screen    07/22/2022   11:08 AM 06/02/2022   11:09 AM 03/22/2022   10:44 AM 01/29/2022   11:06 AM 09/16/2021   10:11 AM 05/19/2021   10:20 AM 01/15/2021   10:27 AM  PHQ 2/9 Scores  PHQ - 2 Score 0 0 0 0 0 0 0  PHQ- 9 Score 0 0 6 0 2 0 0    Fall Risk    07/22/2022   11:11 AM 06/02/2022   11:09 AM 03/22/2022   10:44 AM 01/29/2022   11:06 AM 09/16/2021   10:12 AM  Fall Risk   Falls in the past year? 0 0 0 0 0  Number falls in past yr: 0 0 0 0 0  Injury with Fall? 0 0 0 0 0  Risk for fall due to : No Fall Risks No Fall Risks No Fall Risks No Fall Risks No Fall Risks  Follow up Falls evaluation completed Falls evaluation completed Follow up appointment Falls evaluation completed Falls evaluation completed    [place]:  Any stairs in or around the home? Yes  If so, are there any without handrails? No  Home  free of loose throw rugs in walkways, pet beds, electrical cords, etc? Yes  Adequate lighting in your home to reduce risk of falls? Yes   ASSISTIVE DEVICES UTILIZED TO PREVENT FALLS:  Life  alert? No  Use of a cane, walker or w/c? Yes    Cognitive Function:        07/22/2022   11:11 AM 01/09/2018    8:53 AM 01/03/2017   10:19 AM  6CIT Screen  What Year? 0 points 0 points 0 points  What month? 0 points 0 points 0 points  What time? 0 points 0 points 0 points  Count back from 20 0 points 0 points 0 points  Months in reverse 0 points 0 points 0 points  Repeat phrase 0 points 0 points 0 points  Total Score 0 points 0 points 0 points    Immunizations Immunization History  Administered Date(s) Administered   Fluad Quad(high Dose 65+) 12/29/2018, 01/02/2020, 01/15/2021, 01/29/2022   Influenza, High Dose Seasonal PF 12/28/2017   Influenza,inj,Quad PF,6+ Mos 01/02/2015, 12/31/2016   PFIZER(Purple Top)SARS-COV-2 Vaccination 06/15/2019, 07/03/2019, 02/19/2020   PNEUMOCOCCAL CONJUGATE-20 06/02/2022   Pneumococcal Conjugate-13 03/15/2014   Pneumococcal Polysaccharide-23 04/27/2008, 12/31/2016   Zoster, Live 04/27/2008    TDAP status: Due, Education has been provided regarding the importance of this vaccine. Advised may receive this vaccine at local pharmacy or Health Dept. Aware to provide a copy of the vaccination record if obtained from local pharmacy or Health Dept. Verbalized acceptance and understanding.  Flu Vaccine status: Up to date  Pneumococcal vaccine status: Up to date  Covid-19 vaccine status: Completed vaccines  Qualifies for Shingles Vaccine? Yes    Screening Tests Health Maintenance  Topic Date Due   DTaP/Tdap/Td (1 - Tdap) Never done   Zoster Vaccines- Shingrix (1 of 2) Never done   COVID-19 Vaccine (4 - 2023-24 season) 12/25/2021   HEMOGLOBIN A1C  12/01/2022   Diabetic kidney evaluation - eGFR measurement  01/30/2023   Diabetic kidney evaluation - Urine ACR  01/30/2023   FOOT EXAM  01/30/2023   OPHTHALMOLOGY EXAM  03/16/2023   MAMMOGRAM  07/05/2023   Medicare Annual Wellness (AWV)  07/22/2023   Pneumonia Vaccine 20+ Years old  Completed    INFLUENZA VACCINE  Completed   DEXA SCAN  Completed   Hepatitis C Screening  Completed   HPV VACCINES  Aged Out   COLONOSCOPY (Pts 45-60yrs Insurance coverage will need to be confirmed)  Discontinued    Health Maintenance  Health Maintenance Due  Topic Date Due   DTaP/Tdap/Td (1 - Tdap) Never done   Zoster Vaccines- Shingrix (1 of 2) Never done   COVID-19 Vaccine (4 - 2023-24 season) 12/25/2021    Colorectal cancer screening: No longer required.   Mammogram status: Completed 07/05/2022. Repeat every year  Bone Density status: Completed 07/05/2022. Results reflect: Bone density results: OSTEOPENIA. Repeat every 2-3 years.  Lung Cancer Screening: (Low Dose CT Chest recommended if Age 47-80 years, 30 pack-year currently smoking OR have quit w/in 15years.) does not qualify.   Additional Screening:  Hepatitis C Screening: does qualify; Completed 12/31/2016  Vision Screening: Recommended annual ophthalmology exams for early detection of glaucoma and other disorders of the eye. Is the patient up to date with their annual eye exam?  Yes  Patient see's Dr Ellin Mayhew @ [facility] in [place].  Dental Screening: Recommended annual dental exams for proper oral hygiene  Community Resource Referral / Chronic Care Management: CRR required this visit?  No  CCM required this visit?  No      Plan:     I have personally reviewed and noted the following in the patient's chart:   Medical and social history Use of alcohol, tobacco or illicit drugs  Current medications and supplements including opioid prescriptions. Patient is not currently taking opioid prescriptions. Functional ability and status Nutritional status Physical activity Advanced directives List of other physicians Hospitalizations, surgeries, and ER visits in previous 12 months Vitals Screenings to include cognitive, depression, and falls Referrals and appointments  In addition, I have reviewed and discussed with  patient certain preventive protocols, quality metrics, and best practice recommendations. A written personalized care plan for preventive services as well as general preventive health recommendations were provided to patient.     Clista Bernhardt, [place]   07/22/2022   Nurse Notes: None.

## 2022-08-10 ENCOUNTER — Telehealth: Payer: Self-pay | Admitting: Internal Medicine

## 2022-08-10 NOTE — Telephone Encounter (Signed)
Copied from CRM [id number]. Topic: General - Other >> Aug 10, 2022 12:38 PM Macon Large wrote: Reason for CRM: Pt requests that Chassidy return her call at [phone]

## 2022-08-10 NOTE — Telephone Encounter (Signed)
Patient called to informed us that she is moving to [place] and will be changing providers. Molly Hoover

## 2022-08-16 ENCOUNTER — Other Ambulatory Visit: Payer: Self-pay | Admitting: Internal Medicine

## 2022-08-16 DIAGNOSIS — E118 Type 2 diabetes mellitus with unspecified complications: Secondary | ICD-10-CM

## 2022-08-17 ENCOUNTER — Other Ambulatory Visit: Payer: Self-pay

## 2022-08-17 ENCOUNTER — Other Ambulatory Visit: Payer: Self-pay | Admitting: Internal Medicine

## 2022-08-17 ENCOUNTER — Telehealth: Payer: Self-pay | Admitting: Internal Medicine

## 2022-08-17 DIAGNOSIS — E118 Type 2 diabetes mellitus with unspecified complications: Secondary | ICD-10-CM

## 2022-08-17 MED ORDER — SEMAGLUTIDE (1 MG/DOSE) 4 MG/3ML ~~LOC~~ SOPN: 1.0000 mg | PEN_INJECTOR | SUBCUTANEOUS | 0 refills | Status: DC

## 2022-08-17 NOTE — Telephone Encounter (Signed)
Patient informed. Trulicity removed from med list for "not available."

## 2022-08-17 NOTE — Telephone Encounter (Signed)
Copied from CRM [id number]. Topic: General - Other >> Aug 17, 2022  9:21 AM Everette C wrote: Reason for CRM: The patient would like to speak with C. McAdoo when possible  Please contact further when available

## 2022-08-17 NOTE — Telephone Encounter (Signed)
Patient called. She stated her pharmacy has Trulicity on back order and she wants to know what she should do until she can get this again. Please advise.  - Molly Hoover

## 2022-08-18 ENCOUNTER — Telehealth: Payer: Self-pay

## 2022-08-18 NOTE — Telephone Encounter (Signed)
Completed PA on [email or web address] for OZEMPIC.  Rx #: [id number] Ozempic (1 MG/DOSE) /3ML pen-injectors  Awaiting outcome.

## 2022-08-24 ENCOUNTER — Other Ambulatory Visit: Payer: Self-pay | Admitting: Internal Medicine

## 2022-08-24 ENCOUNTER — Telehealth: Payer: Self-pay | Admitting: Internal Medicine

## 2022-08-24 DIAGNOSIS — E118 Type 2 diabetes mellitus with unspecified complications: Secondary | ICD-10-CM

## 2022-08-24 NOTE — Telephone Encounter (Signed)
Patient called to inform office her Ozempic was approved through insurance. Patient will pick this up from the pharmacy today.  - Dewon Mendizabal

## 2022-08-24 NOTE — Telephone Encounter (Signed)
PA APPROVED.  "24-APR-24:24-APR-25 Ozempic (1 MG/DOSE) 4MG /3ML Combs SOPN Quantity:3;"

## 2022-08-24 NOTE — Telephone Encounter (Signed)
Copied from CRM [id number]. Topic: General - Other >> Aug 24, 2022  8:41 AM [name] wrote: Patient is requesting a callback from [place]. Patient did not say what the call was about.

## 2022-09-06 ENCOUNTER — Other Ambulatory Visit: Payer: Self-pay | Admitting: Internal Medicine

## 2022-09-06 DIAGNOSIS — E118 Type 2 diabetes mellitus with unspecified complications: Secondary | ICD-10-CM

## 2022-09-06 DIAGNOSIS — I10 Essential (primary) hypertension: Secondary | ICD-10-CM

## 2022-09-14 DIAGNOSIS — M25561 Pain in right knee: Secondary | ICD-10-CM | POA: Diagnosis not present

## 2022-09-14 DIAGNOSIS — M13861 Other specified arthritis, right knee: Secondary | ICD-10-CM | POA: Diagnosis not present

## 2022-09-27 DIAGNOSIS — E782 Mixed hyperlipidemia: Secondary | ICD-10-CM | POA: Diagnosis not present

## 2022-09-27 DIAGNOSIS — I10 Essential (primary) hypertension: Secondary | ICD-10-CM | POA: Diagnosis not present

## 2022-09-27 DIAGNOSIS — E1165 Type 2 diabetes mellitus with hyperglycemia: Secondary | ICD-10-CM | POA: Diagnosis not present

## 2022-09-27 DIAGNOSIS — E785 Hyperlipidemia, unspecified: Secondary | ICD-10-CM | POA: Diagnosis not present

## 2022-09-27 DIAGNOSIS — Z1330 Encounter for screening examination for mental health and behavioral disorders, unspecified: Secondary | ICD-10-CM | POA: Diagnosis not present

## 2022-09-27 DIAGNOSIS — Z794 Long term (current) use of insulin: Secondary | ICD-10-CM | POA: Diagnosis not present

## 2022-09-27 DIAGNOSIS — I723 Aneurysm of iliac artery: Secondary | ICD-10-CM | POA: Diagnosis not present

## 2022-09-27 DIAGNOSIS — E559 Vitamin D deficiency, unspecified: Secondary | ICD-10-CM | POA: Diagnosis not present

## 2022-09-27 DIAGNOSIS — J4540 Moderate persistent asthma, uncomplicated: Secondary | ICD-10-CM | POA: Diagnosis not present

## 2022-09-27 DIAGNOSIS — E1169 Type 2 diabetes mellitus with other specified complication: Secondary | ICD-10-CM | POA: Diagnosis not present

## 2022-09-27 DIAGNOSIS — J449 Chronic obstructive pulmonary disease, unspecified: Secondary | ICD-10-CM | POA: Diagnosis not present

## 2022-10-04 ENCOUNTER — Ambulatory Visit: Payer: HMO | Admitting: Internal Medicine

## 2022-10-07 ENCOUNTER — Encounter (INDEPENDENT_AMBULATORY_CARE_PROVIDER_SITE_OTHER): Payer: Self-pay

## 2022-10-07 ENCOUNTER — Ambulatory Visit (INDEPENDENT_AMBULATORY_CARE_PROVIDER_SITE_OTHER): Payer: Self-pay | Admitting: Vascular Surgery

## 2022-10-13 ENCOUNTER — Ambulatory Visit: Payer: HMO | Admitting: Internal Medicine

## 2022-10-25 DIAGNOSIS — E1165 Type 2 diabetes mellitus with hyperglycemia: Secondary | ICD-10-CM | POA: Diagnosis not present

## 2022-10-25 DIAGNOSIS — E559 Vitamin D deficiency, unspecified: Secondary | ICD-10-CM | POA: Diagnosis not present

## 2022-10-25 DIAGNOSIS — E782 Mixed hyperlipidemia: Secondary | ICD-10-CM | POA: Diagnosis not present

## 2022-10-25 DIAGNOSIS — I10 Essential (primary) hypertension: Secondary | ICD-10-CM | POA: Diagnosis not present

## 2022-10-25 DIAGNOSIS — Z794 Long term (current) use of insulin: Secondary | ICD-10-CM | POA: Diagnosis not present

## 2022-10-29 DIAGNOSIS — E875 Hyperkalemia: Secondary | ICD-10-CM | POA: Diagnosis not present

## 2022-10-29 DIAGNOSIS — E1165 Type 2 diabetes mellitus with hyperglycemia: Secondary | ICD-10-CM | POA: Diagnosis not present

## 2022-10-29 DIAGNOSIS — I10 Essential (primary) hypertension: Secondary | ICD-10-CM | POA: Diagnosis not present

## 2022-10-29 DIAGNOSIS — Z794 Long term (current) use of insulin: Secondary | ICD-10-CM | POA: Diagnosis not present

## 2022-10-29 DIAGNOSIS — E782 Mixed hyperlipidemia: Secondary | ICD-10-CM | POA: Diagnosis not present

## 2022-11-05 DIAGNOSIS — E875 Hyperkalemia: Secondary | ICD-10-CM | POA: Diagnosis not present

## 2022-11-19 DIAGNOSIS — E875 Hyperkalemia: Secondary | ICD-10-CM | POA: Diagnosis not present

## 2022-12-08 DIAGNOSIS — M25561 Pain in right knee: Secondary | ICD-10-CM | POA: Diagnosis not present

## 2023-01-05 DIAGNOSIS — M25561 Pain in right knee: Secondary | ICD-10-CM | POA: Diagnosis not present

## 2023-01-17 DIAGNOSIS — M948X6 Other specified disorders of cartilage, lower leg: Secondary | ICD-10-CM | POA: Diagnosis not present

## 2023-01-17 DIAGNOSIS — M7121 Synovial cyst of popliteal space [Baker], right knee: Secondary | ICD-10-CM | POA: Diagnosis not present

## 2023-01-17 DIAGNOSIS — M25561 Pain in right knee: Secondary | ICD-10-CM | POA: Diagnosis not present

## 2023-01-17 DIAGNOSIS — M23321 Other meniscus derangements, posterior horn of medial meniscus, right knee: Secondary | ICD-10-CM | POA: Diagnosis not present

## 2023-01-17 DIAGNOSIS — M23341 Other meniscus derangements, anterior horn of lateral meniscus, right knee: Secondary | ICD-10-CM | POA: Diagnosis not present

## 2023-01-24 DIAGNOSIS — M1711 Unilateral primary osteoarthritis, right knee: Secondary | ICD-10-CM | POA: Diagnosis not present

## 2023-02-03 DIAGNOSIS — Z96651 Presence of right artificial knee joint: Secondary | ICD-10-CM | POA: Diagnosis not present

## 2023-02-03 DIAGNOSIS — Z471 Aftercare following joint replacement surgery: Secondary | ICD-10-CM | POA: Diagnosis not present

## 2023-02-03 DIAGNOSIS — E119 Type 2 diabetes mellitus without complications: Secondary | ICD-10-CM | POA: Diagnosis not present

## 2023-02-03 DIAGNOSIS — Z885 Allergy status to narcotic agent status: Secondary | ICD-10-CM | POA: Diagnosis not present

## 2023-02-03 DIAGNOSIS — Z6841 Body Mass Index (BMI) 40.0 and over, adult: Secondary | ICD-10-CM | POA: Diagnosis not present

## 2023-02-03 DIAGNOSIS — M84461A Pathological fracture, right tibia, initial encounter for fracture: Secondary | ICD-10-CM | POA: Diagnosis not present

## 2023-02-03 DIAGNOSIS — M23321 Other meniscus derangements, posterior horn of medial meniscus, right knee: Secondary | ICD-10-CM | POA: Diagnosis not present

## 2023-02-03 DIAGNOSIS — S83231A Complex tear of medial meniscus, current injury, right knee, initial encounter: Secondary | ICD-10-CM | POA: Diagnosis not present

## 2023-02-03 DIAGNOSIS — E782 Mixed hyperlipidemia: Secondary | ICD-10-CM | POA: Diagnosis not present

## 2023-02-03 DIAGNOSIS — M794 Hypertrophy of (infrapatellar) fat pad: Secondary | ICD-10-CM | POA: Diagnosis not present

## 2023-02-03 DIAGNOSIS — Z7984 Long term (current) use of oral hypoglycemic drugs: Secondary | ICD-10-CM | POA: Diagnosis not present

## 2023-02-03 DIAGNOSIS — M25561 Pain in right knee: Secondary | ICD-10-CM | POA: Diagnosis not present

## 2023-02-03 DIAGNOSIS — S83281A Other tear of lateral meniscus, current injury, right knee, initial encounter: Secondary | ICD-10-CM | POA: Diagnosis not present

## 2023-02-03 DIAGNOSIS — K219 Gastro-esophageal reflux disease without esophagitis: Secondary | ICD-10-CM | POA: Diagnosis not present

## 2023-02-03 DIAGNOSIS — M23361 Other meniscus derangements, other lateral meniscus, right knee: Secondary | ICD-10-CM | POA: Diagnosis not present

## 2023-02-03 DIAGNOSIS — I10 Essential (primary) hypertension: Secondary | ICD-10-CM | POA: Diagnosis not present

## 2023-02-03 DIAGNOSIS — Z794 Long term (current) use of insulin: Secondary | ICD-10-CM | POA: Diagnosis not present

## 2023-02-03 DIAGNOSIS — J4540 Moderate persistent asthma, uncomplicated: Secondary | ICD-10-CM | POA: Diagnosis not present

## 2023-02-03 DIAGNOSIS — J449 Chronic obstructive pulmonary disease, unspecified: Secondary | ICD-10-CM | POA: Diagnosis not present

## 2023-02-03 DIAGNOSIS — Z7985 Long-term (current) use of injectable non-insulin antidiabetic drugs: Secondary | ICD-10-CM | POA: Diagnosis not present

## 2023-02-03 DIAGNOSIS — Z888 Allergy status to other drugs, medicaments and biological substances status: Secondary | ICD-10-CM | POA: Diagnosis not present

## 2023-02-03 DIAGNOSIS — M84361A Stress fracture, right tibia, initial encounter for fracture: Secondary | ICD-10-CM | POA: Diagnosis not present

## 2023-02-03 DIAGNOSIS — M2241 Chondromalacia patellae, right knee: Secondary | ICD-10-CM | POA: Diagnosis not present

## 2023-02-03 DIAGNOSIS — J4489 Other specified chronic obstructive pulmonary disease: Secondary | ICD-10-CM | POA: Diagnosis not present

## 2023-02-03 DIAGNOSIS — S83241A Other tear of medial meniscus, current injury, right knee, initial encounter: Secondary | ICD-10-CM | POA: Diagnosis not present

## 2023-02-04 ENCOUNTER — Encounter: Payer: HMO | Admitting: Internal Medicine

## 2023-04-25 DIAGNOSIS — Z794 Long term (current) use of insulin: Secondary | ICD-10-CM | POA: Diagnosis not present

## 2023-04-25 DIAGNOSIS — I10 Essential (primary) hypertension: Secondary | ICD-10-CM | POA: Diagnosis not present

## 2023-04-25 DIAGNOSIS — E782 Mixed hyperlipidemia: Secondary | ICD-10-CM | POA: Diagnosis not present

## 2023-04-25 DIAGNOSIS — E1165 Type 2 diabetes mellitus with hyperglycemia: Secondary | ICD-10-CM | POA: Diagnosis not present

## 2023-05-10 DIAGNOSIS — I10 Essential (primary) hypertension: Secondary | ICD-10-CM | POA: Diagnosis not present

## 2023-05-10 DIAGNOSIS — E1165 Type 2 diabetes mellitus with hyperglycemia: Secondary | ICD-10-CM | POA: Diagnosis not present

## 2023-05-10 DIAGNOSIS — E782 Mixed hyperlipidemia: Secondary | ICD-10-CM | POA: Diagnosis not present

## 2023-05-10 DIAGNOSIS — Z794 Long term (current) use of insulin: Secondary | ICD-10-CM | POA: Diagnosis not present

## 2023-05-10 DIAGNOSIS — Z1330 Encounter for screening examination for mental health and behavioral disorders, unspecified: Secondary | ICD-10-CM | POA: Diagnosis not present

## 2023-05-23 DIAGNOSIS — E119 Type 2 diabetes mellitus without complications: Secondary | ICD-10-CM | POA: Diagnosis not present

## 2023-05-23 DIAGNOSIS — H26491 Other secondary cataract, right eye: Secondary | ICD-10-CM | POA: Diagnosis not present

## 2023-05-23 DIAGNOSIS — Z961 Presence of intraocular lens: Secondary | ICD-10-CM | POA: Diagnosis not present

## 2023-06-02 DIAGNOSIS — J849 Interstitial pulmonary disease, unspecified: Secondary | ICD-10-CM | POA: Diagnosis not present

## 2023-06-02 DIAGNOSIS — R410 Disorientation, unspecified: Secondary | ICD-10-CM | POA: Diagnosis not present

## 2023-06-02 DIAGNOSIS — R531 Weakness: Secondary | ICD-10-CM | POA: Diagnosis not present

## 2023-06-02 DIAGNOSIS — R42 Dizziness and giddiness: Secondary | ICD-10-CM | POA: Diagnosis not present

## 2023-06-02 DIAGNOSIS — R5383 Other fatigue: Secondary | ICD-10-CM | POA: Diagnosis not present

## 2023-06-02 DIAGNOSIS — J3489 Other specified disorders of nose and nasal sinuses: Secondary | ICD-10-CM | POA: Diagnosis not present

## 2023-06-02 DIAGNOSIS — J449 Chronic obstructive pulmonary disease, unspecified: Secondary | ICD-10-CM | POA: Diagnosis not present

## 2023-06-02 DIAGNOSIS — J069 Acute upper respiratory infection, unspecified: Secondary | ICD-10-CM | POA: Diagnosis not present

## 2023-06-02 DIAGNOSIS — N390 Urinary tract infection, site not specified: Secondary | ICD-10-CM | POA: Diagnosis not present

## 2023-06-09 DIAGNOSIS — E1165 Type 2 diabetes mellitus with hyperglycemia: Secondary | ICD-10-CM | POA: Diagnosis not present

## 2023-06-09 DIAGNOSIS — Z794 Long term (current) use of insulin: Secondary | ICD-10-CM | POA: Diagnosis not present

## 2023-06-09 DIAGNOSIS — J069 Acute upper respiratory infection, unspecified: Secondary | ICD-10-CM | POA: Diagnosis not present

## 2023-06-13 DIAGNOSIS — H02403 Unspecified ptosis of bilateral eyelids: Secondary | ICD-10-CM | POA: Diagnosis not present

## 2023-06-13 DIAGNOSIS — H526 Other disorders of refraction: Secondary | ICD-10-CM | POA: Diagnosis not present

## 2023-06-13 DIAGNOSIS — E119 Type 2 diabetes mellitus without complications: Secondary | ICD-10-CM | POA: Diagnosis not present

## 2023-06-13 DIAGNOSIS — H43813 Vitreous degeneration, bilateral: Secondary | ICD-10-CM | POA: Diagnosis not present

## 2023-06-13 DIAGNOSIS — H02831 Dermatochalasis of right upper eyelid: Secondary | ICD-10-CM | POA: Diagnosis not present

## 2023-06-13 DIAGNOSIS — Z961 Presence of intraocular lens: Secondary | ICD-10-CM | POA: Diagnosis not present

## 2023-06-13 DIAGNOSIS — H02834 Dermatochalasis of left upper eyelid: Secondary | ICD-10-CM | POA: Diagnosis not present

## 2023-06-13 DIAGNOSIS — H26491 Other secondary cataract, right eye: Secondary | ICD-10-CM | POA: Diagnosis not present

## 2023-08-01 DIAGNOSIS — Z794 Long term (current) use of insulin: Secondary | ICD-10-CM | POA: Diagnosis not present

## 2023-08-01 DIAGNOSIS — E1165 Type 2 diabetes mellitus with hyperglycemia: Secondary | ICD-10-CM | POA: Diagnosis not present

## 2023-08-01 DIAGNOSIS — E559 Vitamin D deficiency, unspecified: Secondary | ICD-10-CM | POA: Diagnosis not present

## 2023-08-01 DIAGNOSIS — E782 Mixed hyperlipidemia: Secondary | ICD-10-CM | POA: Diagnosis not present

## 2023-08-01 DIAGNOSIS — I10 Essential (primary) hypertension: Secondary | ICD-10-CM | POA: Diagnosis not present

## 2023-08-08 DIAGNOSIS — I10 Essential (primary) hypertension: Secondary | ICD-10-CM | POA: Diagnosis not present

## 2023-08-08 DIAGNOSIS — Z794 Long term (current) use of insulin: Secondary | ICD-10-CM | POA: Diagnosis not present

## 2023-08-08 DIAGNOSIS — E1165 Type 2 diabetes mellitus with hyperglycemia: Secondary | ICD-10-CM | POA: Diagnosis not present

## 2023-08-08 DIAGNOSIS — E782 Mixed hyperlipidemia: Secondary | ICD-10-CM | POA: Diagnosis not present

## 2024-02-02 DIAGNOSIS — Z794 Long term (current) use of insulin: Secondary | ICD-10-CM | POA: Diagnosis not present

## 2024-02-02 DIAGNOSIS — E1165 Type 2 diabetes mellitus with hyperglycemia: Secondary | ICD-10-CM | POA: Diagnosis not present

## 2024-02-07 DIAGNOSIS — Z794 Long term (current) use of insulin: Secondary | ICD-10-CM | POA: Diagnosis not present

## 2024-02-07 DIAGNOSIS — E1165 Type 2 diabetes mellitus with hyperglycemia: Secondary | ICD-10-CM | POA: Diagnosis not present

## 2024-02-07 DIAGNOSIS — E782 Mixed hyperlipidemia: Secondary | ICD-10-CM | POA: Diagnosis not present

## 2024-02-07 DIAGNOSIS — I10 Essential (primary) hypertension: Secondary | ICD-10-CM | POA: Diagnosis not present

## 2024-02-07 DIAGNOSIS — Z23 Encounter for immunization: Secondary | ICD-10-CM | POA: Diagnosis not present

## 2024-02-07 DIAGNOSIS — E559 Vitamin D deficiency, unspecified: Secondary | ICD-10-CM | POA: Diagnosis not present

## 2024-02-07 DIAGNOSIS — E785 Hyperlipidemia, unspecified: Secondary | ICD-10-CM | POA: Diagnosis not present

## 2024-02-07 DIAGNOSIS — E1169 Type 2 diabetes mellitus with other specified complication: Secondary | ICD-10-CM | POA: Diagnosis not present

## 2024-02-07 DIAGNOSIS — Z0000 Encounter for general adult medical examination without abnormal findings: Secondary | ICD-10-CM | POA: Diagnosis not present

## 2024-02-23 NOTE — Progress Notes (Signed)
 Molly Hoover                                          MRN: [id number]   02/23/2024   The [facility] Quality Team Specialist reviewed this patient medical record for the purposes of chart review for care gap closure. The following were reviewed: abstraction for care gap closure-controlling blood pressure.    [facility]I Quality Team

## 2024-03-06 ENCOUNTER — Telehealth (INDEPENDENT_AMBULATORY_CARE_PROVIDER_SITE_OTHER): Payer: Self-pay

## 2024-03-06 DIAGNOSIS — I723 Aneurysm of iliac artery: Secondary | ICD-10-CM | POA: Diagnosis not present

## 2024-03-06 DIAGNOSIS — I872 Venous insufficiency (chronic) (peripheral): Secondary | ICD-10-CM | POA: Diagnosis not present

## 2024-03-06 NOTE — Telephone Encounter (Addendum)
 Call from [facility] called left vm on nurse line inquiring on physical images for Triple A done on 03/2022 needed for referral that they have received. Advised her that I would check on the process of securing this for her.   Per Radiology we are to refer to Medical records, Call to [place] gave her the number for medical records for our location [phone]

## 2024-03-19 DIAGNOSIS — I723 Aneurysm of iliac artery: Secondary | ICD-10-CM | POA: Diagnosis not present

## 2024-04-03 ENCOUNTER — Ambulatory Visit (INDEPENDENT_AMBULATORY_CARE_PROVIDER_SITE_OTHER): Payer: Self-pay | Admitting: Vascular Surgery

## 2024-04-03 ENCOUNTER — Encounter (INDEPENDENT_AMBULATORY_CARE_PROVIDER_SITE_OTHER): Payer: Self-pay
